# Patient Record
Sex: Female | Born: 1945 | Race: White | Hispanic: No | Marital: Married | Smoking: Never smoker
Health system: Southern US, Community
[De-identification: ages and names within clinical notes are randomized; demographics above are authoritative.]

## PROBLEM LIST (undated history)

## (undated) DIAGNOSIS — F32A Depression, unspecified: Secondary | ICD-10-CM

## (undated) DIAGNOSIS — M810 Age-related osteoporosis without current pathological fracture: Secondary | ICD-10-CM

## (undated) DIAGNOSIS — D509 Iron deficiency anemia, unspecified: Secondary | ICD-10-CM

## (undated) DIAGNOSIS — K219 Gastro-esophageal reflux disease without esophagitis: Secondary | ICD-10-CM

## (undated) DIAGNOSIS — I10 Essential (primary) hypertension: Secondary | ICD-10-CM

## (undated) DIAGNOSIS — G4700 Insomnia, unspecified: Secondary | ICD-10-CM

## (undated) DIAGNOSIS — Z9889 Other specified postprocedural states: Secondary | ICD-10-CM

## (undated) DIAGNOSIS — E785 Hyperlipidemia, unspecified: Secondary | ICD-10-CM

## (undated) DIAGNOSIS — Z961 Presence of intraocular lens: Secondary | ICD-10-CM

## (undated) DIAGNOSIS — I509 Heart failure, unspecified: Secondary | ICD-10-CM

## (undated) DIAGNOSIS — E063 Autoimmune thyroiditis: Secondary | ICD-10-CM

## (undated) DIAGNOSIS — G5601 Carpal tunnel syndrome, right upper limb: Secondary | ICD-10-CM

## (undated) DIAGNOSIS — I350 Nonrheumatic aortic (valve) stenosis: Secondary | ICD-10-CM

## (undated) DIAGNOSIS — M7580 Other shoulder lesions, unspecified shoulder: Secondary | ICD-10-CM

## (undated) DIAGNOSIS — M1990 Unspecified osteoarthritis, unspecified site: Secondary | ICD-10-CM

## (undated) DIAGNOSIS — R0609 Other forms of dyspnea: Secondary | ICD-10-CM

## (undated) DIAGNOSIS — N189 Chronic kidney disease, unspecified: Secondary | ICD-10-CM

## (undated) DIAGNOSIS — K3184 Gastroparesis: Secondary | ICD-10-CM

## (undated) DIAGNOSIS — R112 Nausea with vomiting, unspecified: Secondary | ICD-10-CM

## (undated) DIAGNOSIS — J189 Pneumonia, unspecified organism: Secondary | ICD-10-CM

## (undated) DIAGNOSIS — N183 Chronic kidney disease, stage 3 unspecified: Secondary | ICD-10-CM

## (undated) DIAGNOSIS — Z8719 Personal history of other diseases of the digestive system: Secondary | ICD-10-CM

## (undated) DIAGNOSIS — G5600 Carpal tunnel syndrome, unspecified upper limb: Secondary | ICD-10-CM

## (undated) DIAGNOSIS — M8580 Other specified disorders of bone density and structure, unspecified site: Secondary | ICD-10-CM

## (undated) DIAGNOSIS — G4730 Sleep apnea, unspecified: Secondary | ICD-10-CM

## (undated) DIAGNOSIS — T8859XA Other complications of anesthesia, initial encounter: Secondary | ICD-10-CM

## (undated) DIAGNOSIS — E139 Other specified diabetes mellitus without complications: Secondary | ICD-10-CM

## (undated) DIAGNOSIS — R011 Cardiac murmur, unspecified: Secondary | ICD-10-CM

## (undated) DIAGNOSIS — R0600 Dyspnea, unspecified: Secondary | ICD-10-CM

## (undated) DIAGNOSIS — E119 Type 2 diabetes mellitus without complications: Secondary | ICD-10-CM

## (undated) DIAGNOSIS — K862 Cyst of pancreas: Secondary | ICD-10-CM

## (undated) DIAGNOSIS — I5030 Unspecified diastolic (congestive) heart failure: Secondary | ICD-10-CM

## (undated) DIAGNOSIS — N281 Cyst of kidney, acquired: Secondary | ICD-10-CM

## (undated) DIAGNOSIS — E669 Obesity, unspecified: Secondary | ICD-10-CM

## (undated) DIAGNOSIS — G2581 Restless legs syndrome: Secondary | ICD-10-CM

## (undated) HISTORY — PX: COLONOSCOPY: SHX174

## (undated) HISTORY — PX: MASTECTOMY: SHX3

## (undated) HISTORY — PX: JOINT REPLACEMENT: SHX530

## (undated) HISTORY — PX: FRACTURE SURGERY: SHX138

---

## 1992-11-17 HISTORY — PX: ABDOMINAL HYSTERECTOMY: SHX81

## 1998-11-17 HISTORY — PX: BREAST SURGERY: SHX581

## 1999-01-16 DIAGNOSIS — C801 Malignant (primary) neoplasm, unspecified: Secondary | ICD-10-CM

## 1999-01-16 HISTORY — DX: Malignant (primary) neoplasm, unspecified: C80.1

## 2000-01-16 DIAGNOSIS — G2581 Restless legs syndrome: Secondary | ICD-10-CM | POA: Insufficient documentation

## 2002-11-17 HISTORY — PX: CARPAL TUNNEL RELEASE: SHX101

## 2002-11-17 HISTORY — PX: ANKLE FRACTURE SURGERY: SHX122

## 2008-11-17 DIAGNOSIS — E063 Autoimmune thyroiditis: Secondary | ICD-10-CM | POA: Insufficient documentation

## 2014-11-17 HISTORY — PX: SHOULDER ARTHROSCOPY: SHX128

## 2015-11-18 DIAGNOSIS — I639 Cerebral infarction, unspecified: Secondary | ICD-10-CM

## 2015-11-18 DIAGNOSIS — G459 Transient cerebral ischemic attack, unspecified: Secondary | ICD-10-CM

## 2015-11-18 HISTORY — DX: Transient cerebral ischemic attack, unspecified: G45.9

## 2015-11-18 HISTORY — DX: Cerebral infarction, unspecified: I63.9

## 2016-11-17 HISTORY — PX: BRACHIOPLASTY: SUR162

## 2018-11-17 HISTORY — PX: UPPER ESOPHAGEAL ENDOSCOPIC ULTRASOUND (EUS): SHX6562

## 2018-12-18 DIAGNOSIS — K824 Cholesterolosis of gallbladder: Secondary | ICD-10-CM | POA: Insufficient documentation

## 2018-12-18 DIAGNOSIS — K3184 Gastroparesis: Secondary | ICD-10-CM | POA: Insufficient documentation

## 2020-01-20 DIAGNOSIS — I10 Essential (primary) hypertension: Secondary | ICD-10-CM | POA: Insufficient documentation

## 2020-01-20 DIAGNOSIS — R0609 Other forms of dyspnea: Secondary | ICD-10-CM | POA: Insufficient documentation

## 2020-01-20 DIAGNOSIS — N1832 Chronic kidney disease, stage 3b: Secondary | ICD-10-CM | POA: Diagnosis present

## 2020-01-20 DIAGNOSIS — I5032 Chronic diastolic (congestive) heart failure: Secondary | ICD-10-CM | POA: Diagnosis present

## 2020-01-30 ENCOUNTER — Ambulatory Visit: Payer: Medicare Other

## 2020-01-30 ENCOUNTER — Encounter: Payer: Self-pay | Admitting: Emergency Medicine

## 2020-01-30 ENCOUNTER — Ambulatory Visit
Admission: EM | Admit: 2020-01-30 | Discharge: 2020-01-30 | Disposition: A | Discharge status: Home / Self Care | Payer: Medicare Other | Attending: Family Medicine | Admitting: Family Medicine

## 2020-01-30 ENCOUNTER — Other Ambulatory Visit: Payer: Self-pay

## 2020-01-30 DIAGNOSIS — W010XXA Fall on same level from slipping, tripping and stumbling without subsequent striking against object, initial encounter: Secondary | ICD-10-CM | POA: Diagnosis not present

## 2020-01-30 DIAGNOSIS — S63501A Unspecified sprain of right wrist, initial encounter: Secondary | ICD-10-CM | POA: Diagnosis not present

## 2020-01-30 HISTORY — DX: Essential (primary) hypertension: I10

## 2020-01-30 HISTORY — DX: Type 2 diabetes mellitus without complications: E11.9

## 2020-01-30 NOTE — ED Triage Notes (Signed)
Pt c/o right wrist pain. She states that she fell today about 3:30. She slipped on wet floor in her home. She has mild swelling.

## 2020-01-30 NOTE — Discharge Instructions (Signed)
Apply ice 20 minutes out of every 2 hours 4-5 times daily for comfort.  Weight your wrist frequently above the level of your heart sufficiently to control pain and swelling.  Continue to have pain in 2 to 3 weeks follow-up with orthopedics

## 2020-01-30 NOTE — ED Provider Notes (Signed)
[facility]    CSN: [id number] Arrival date & time: 01/30/20  1845      History   Chief Complaint Chief Complaint  Patient presents with  . Fall  . Wrist Pain    right    HPI Lindsay Glenn is a 74 y.o. female.   HPI  74 year old right-hand-dominant female was cleaning up spilled spaghetti sauce in her kitchen when she slipped on residual sauce falling forward onto her right outstretched hand.  Happened about 330 today.  That time she been complaining of right wrist pain over the carpal navicular area.  Some mild swelling in this area.  There is no ecchymosis or bruising at the present time.         Past Medical History:  Diagnosis Date  . Diabetes mellitus without complication ([place])   . Hypertension     There are no problems to display for this patient.   Past Surgical History:  Procedure Laterality Date  . ABDOMINAL HYSTERECTOMY    . ANKLE FRACTURE SURGERY Right   . BREAST SURGERY     bilateral mastectomy  . CARPAL TUNNEL RELEASE Right   . JOINT REPLACEMENT Bilateral    knee  . SHOULDER ARTHROSCOPY Right     OB History   No obstetric history on file.      Home Medications    Prior to Admission medications   Medication Sig Start Date End Date Taking? Authorizing Provider  Azilsartan Medoxomil (EDARBI) 40 MG TABS  10/06/19  Yes [provider]  carvedilol (COREG) 25 MG tablet  01/19/20  Yes [provider]  cloNIDine (CATAPRES) 0.1 MG tablet  07/01/19  Yes [provider]  empagliflozin (JARDIANCE) 10 MG TABS tablet Take by mouth. 01/20/20 01/19/21 Yes [provider]  furosemide (LASIX) 20 MG tablet daily   Yes [provider]  hydrALAZINE (APRESOLINE) 10 MG tablet hydralazine 10 mg tablet  TAKE ONE TABLET BY MOUTH PRN   Yes [provider]  insulin aspart (NOVOLOG) 100 UNIT/ML injection  01/18/20  Yes [provider]  omeprazole (PRILOSEC) 20 MG capsule  12/09/19  Yes [provider]  pravastatin (PRAVACHOL) 80 MG tablet  12/10/19  Yes [provider]  ropinirole (REQUIP) 5 MG tablet Take 5 mg by mouth at bedtime. 12/16/19  Yes [provider]  spironolactone (ALDACTONE) 25 MG tablet spironolactone 25 mg tablet   Yes [provider]    Family History History reviewed. No pertinent family history.  Social History Social History   Tobacco Use  . Smoking status: Never Smoker  . Smokeless tobacco: Never Used  Substance Use Topics  . Alcohol use: Not Currently  . Drug use: Not Currently     Allergies   Patient has no known allergies.   Review of Systems Review of Systems  Constitutional: Positive for activity change. Negative for appetite change, chills, fatigue and fever.  Musculoskeletal: Positive for arthralgias and joint swelling.  All other systems reviewed and are negative.    Physical Exam Triage Vital Signs ED Triage Vitals  Enc Vitals Group     BP 01/30/20 1904 (!) 179/66     Pulse Rate 01/30/20 1904 62     Resp 01/30/20 1904 18     Temp 01/30/20 1904 98.4 F (36.9 C)     Temp Source 01/30/20 1904 Oral     SpO2 01/30/20 1904 99 %     Weight 01/30/20 1857 196 lb (88.9 kg)  Height 01/30/20 1857 5' (1.524 m)     Head Circumference --      Peak Flow --      Pain Score 01/30/20 1857 6     Pain Loc --      Pain Edu? --      Excl. in [place]? --    No data found.  Updated Vital Signs BP (!) 179/66 (BP Location: Left Arm)   Pulse 62   Temp 98.4 F (36.9 C) (Oral)   Resp 18   Ht 5' (1.524 m)   Wt 196 lb (88.9 kg)   SpO2 99%   BMI 38.28 kg/m   Visual Acuity Right Eye Distance:   Left Eye Distance:   Bilateral Distance:    Right Eye Near:   Left Eye Near:    Bilateral Near:     Physical Exam Vitals and nursing note reviewed.  Constitutional:      General: She is not in acute distress.    Appearance: Normal appearance. She is not ill-appearing or toxic-appearing.  HENT:     Head:  Normocephalic and atraumatic.  Eyes:     Conjunctiva/sclera: Conjunctivae normal.  Musculoskeletal:        General: Swelling, tenderness and signs of injury present.     Cervical back: Normal range of motion and neck supple.     Comments: Examination of the right dominant wrist shows mild swelling over the snuffbox area.  This area is the most tender.  She has extension to 45 degrees flexion to 55 degrees pronation to 90 degrees supination to 20 degrees.  She is most tender over the snuffbox area.  There is no tenderness over the phalanges or metacarpals.  Skin:    General: Skin is warm and dry.  Neurological:     General: No focal deficit present.     Mental Status: She is alert and oriented to person, place, and time.  Psychiatric:        Mood and Affect: Mood normal.        Behavior: Behavior normal.        Thought Content: Thought content normal.        Judgment: Judgment normal.      UC Treatments / Results  Labs (all labs ordered are listed, but only abnormal results are displayed) Labs Reviewed - No data to display  EKG   Radiology DG Wrist Complete Right  Result Date: 01/30/2020 CLINICAL DATA:  Fall with wrist pain EXAM: RIGHT WRIST - COMPLETE 3+ VIEW COMPARISON:  None. FINDINGS: No fracture or malalignment. No radiopaque foreign body in the soft tissues. Moderate degenerative changes at the first [facility] joint. IMPRESSION: No acute osseous abnormality. Electronically Signed   By: Donavan Foil M.D.   On: 01/30/2020 20:06    Procedures Procedures (including critical care time)  Medications Ordered in UC Medications - No data to display  Initial Impression / Assessment and Plan / UC Course  I have reviewed the triage vital signs and the nursing notes.  Pertinent labs & imaging results that were available during my care of the patient were reviewed by me and considered in my medical decision making (see chart for details).    74 year old female presents today after a  fall with injury to her right dominant wrist.  Examination showed minimal amount of swelling and most of pain over the carpal navicular area.  I independently reviewed the x-rays which did not show fracture or dislocation.  This was confirmed later by  radiology.  He was instructed to place ice on the area 20 minutes every 2 hours 4-5 times daily as necessary for pain or swelling.  Elevate above the level heart sufficiently to control swelling and pain.  Her a Velcro wrist splint for support she may wean as tolerated.  She was told if she continues to have pain she should follow-up with the orthopedic surgery after 2 to 3 weeks.  Final Clinical Impressions(s) / UC Diagnoses   Final diagnoses:  Sprain of right wrist, initial encounter     Discharge Instructions     Apply ice 20 minutes out of every 2 hours 4-5 times daily for comfort.  Weight your wrist frequently above the level of your heart sufficiently to control pain and swelling.  Continue to have pain in 2 to 3 weeks follow-up with orthopedics    ED Prescriptions    None     PDMP not reviewed this encounter.   Lorin Picket, PA-C 01/30/20 2022

## 2020-03-20 DIAGNOSIS — E785 Hyperlipidemia, unspecified: Secondary | ICD-10-CM | POA: Insufficient documentation

## 2020-03-21 ENCOUNTER — Other Ambulatory Visit: Payer: Self-pay | Admitting: Surgery

## 2020-03-28 ENCOUNTER — Encounter
Admission: RE | Admit: 2020-03-28 | Discharge: 2020-03-28 | Disposition: A | Discharge status: Home / Self Care | Payer: Medicare Other | Source: Ambulatory Visit | Attending: Surgery | Admitting: Surgery

## 2020-03-28 ENCOUNTER — Other Ambulatory Visit: Payer: Self-pay

## 2020-03-28 ENCOUNTER — Other Ambulatory Visit
Admission: RE | Admit: 2020-03-28 | Discharge: 2020-03-28 | Disposition: A | Discharge status: Home / Self Care | Payer: Medicare Other | Source: Ambulatory Visit | Attending: Surgery | Admitting: Surgery

## 2020-03-28 ENCOUNTER — Inpatient Hospital Stay: Admission: RE | Admit: 2020-03-28 | Payer: Medicare Other | Source: Ambulatory Visit

## 2020-03-28 DIAGNOSIS — Z01818 Encounter for other preprocedural examination: Secondary | ICD-10-CM | POA: Insufficient documentation

## 2020-03-28 DIAGNOSIS — I10 Essential (primary) hypertension: Secondary | ICD-10-CM

## 2020-03-28 HISTORY — DX: Carpal tunnel syndrome, right upper limb: G56.01

## 2020-03-28 HISTORY — DX: Dyspnea, unspecified: R06.00

## 2020-03-28 HISTORY — DX: Obesity, unspecified: E66.9

## 2020-03-28 HISTORY — DX: Cyst of pancreas: K86.2

## 2020-03-28 HISTORY — DX: Other specified disorders of bone density and structure, unspecified site: M85.80

## 2020-03-28 HISTORY — DX: Insomnia, unspecified: G47.00

## 2020-03-28 HISTORY — DX: Unspecified diastolic (congestive) heart failure: I50.30

## 2020-03-28 HISTORY — DX: Sleep apnea, unspecified: G47.30

## 2020-03-28 HISTORY — DX: Hyperlipidemia, unspecified: E78.5

## 2020-03-28 HISTORY — DX: Gastro-esophageal reflux disease without esophagitis: K21.9

## 2020-03-28 HISTORY — DX: Chronic kidney disease, unspecified: N18.9

## 2020-03-28 HISTORY — DX: Autoimmune thyroiditis: E06.3

## 2020-03-28 HISTORY — DX: Personal history of other diseases of the digestive system: Z87.19

## 2020-03-28 HISTORY — DX: Restless legs syndrome: G25.81

## 2020-03-28 HISTORY — DX: Gastroparesis: K31.84

## 2020-03-28 NOTE — Patient Instructions (Addendum)
COVID TESTING Date: Monday, May 17 Testing site:  [facility] [address] Thru Hours:  [id number] am - 1:00 pm Once you are tested, you are asked to stay quarantined (avoiding public places) until after your surgery.   Your procedure is scheduled on: Wednesday, May 19 Report to Day Surgery on the 2nd floor of the [facility]. To find out your arrival time, please call [phone] between 1PM - 3PM on: Tuesday, May 18  REMEMBER: Instructions that are not followed completely may result in serious medical risk, up to and including death; or upon the discretion of your surgeon and anesthesiologist your surgery may need to be rescheduled.  Do not eat food after midnight the night before surgery.  No gum chewing, lozengers or hard candies.  You may however, drink water up to 2 hours before you are scheduled to arrive for your surgery. Do not drink anything within 2 hours of your scheduled arrival time.  ENSURE PRE-SURGERY CARBOHYDRATE DRINK:  Complete drinking 2 hours prior to scheduled arrival time.  TAKE THESE MEDICATIONS THE MORNING OF SURGERY WITH A SIP OF WATER:  1.  Carvedilol 2.  Omeprazole - (take one the night before and one on the morning of surgery - helps to prevent nausea after surgery.)  Keep the insulin pump on; the anesthesiologist will instruct you on any changes the day of surgery.  Stop aspirin and Anti-inflammatories (NSAIDS) such as Advil, Aleve, Ibuprofen, Motrin, Naproxen, Naprosyn and Aspirin based products such as Excedrin, Goodys Powder, BC Powder. (May take Tylenol or Acetaminophen if needed.)  Stop ANY OVER THE COUNTER supplements until after surgery. (May continue Vitamin D, Vitamin B, and multivitamin.)  No Alcohol for 24 hours before or after surgery.  On the morning of surgery brush your teeth with toothpaste and water, you may rinse your mouth with mouthwash if you wish. Do not swallow any toothpaste or mouthwash.  Do not  wear jewelry, make-up, hairpins, clips or nail polish.  Do not wear lotions, powders, or perfumes.   Do not shave 48 hours prior to surgery.   Contact lenses, hearing aids and dentures may not be worn into surgery.  Do not bring valuables to the hospital. [facility] is not responsible for any missing/lost belongings or valuables.   Use CHG Soap as directed on instruction sheet.  Notify your doctor if there is any change in your medical condition (cold, fever, infection).  Wear comfortable clothing (specific to your surgery type) to the hospital.  Plan for stool softeners for home use; pain medications have a tendency to cause constipation. You can also help prevent constipation by eating foods high in fiber such as fruits and vegetables and drinking plenty of fluids as your diet allows.  After surgery, you can help prevent lung complications by doing breathing exercises.  Take deep breaths and cough every 1-2 hours. Your doctor may order a device called an Incentive Spirometer to help you take deep breaths.  If you are being discharged the day of surgery, you will not be allowed to drive home. You will need a responsible adult (18 years or older) to drive you home and stay with you that night.   If you are taking public transportation, you will need to have a responsible adult (18 years or older) with you. Please confirm with your physician that it is acceptable to use public transportation.   Please call the [place] [facility] at [phone] if you have any questions  about these instructions.  Visitation Policy:  Patients undergoing a surgery or procedure may have one family member or support person with them as long as that person is not COVID-19 positive or experiencing its symptoms.  That person may remain in the waiting area during the procedure.

## 2020-04-02 ENCOUNTER — Other Ambulatory Visit: Payer: Self-pay

## 2020-04-02 ENCOUNTER — Other Ambulatory Visit
Admission: RE | Admit: 2020-04-02 | Discharge: 2020-04-02 | Disposition: A | Discharge status: Home / Self Care | Payer: Medicare Other | Source: Ambulatory Visit | Attending: Surgery | Admitting: Surgery

## 2020-04-02 DIAGNOSIS — Z01812 Encounter for preprocedural laboratory examination: Secondary | ICD-10-CM | POA: Diagnosis present

## 2020-04-02 DIAGNOSIS — Z20822 Contact with and (suspected) exposure to covid-19: Secondary | ICD-10-CM | POA: Insufficient documentation

## 2020-04-03 LAB — SARS CORONAVIRUS 2 (TAT 6-24 HRS): SARS Coronavirus 2: NEGATIVE

## 2020-04-04 ENCOUNTER — Encounter: Payer: Self-pay | Admitting: Surgery

## 2020-04-04 ENCOUNTER — Other Ambulatory Visit: Payer: Self-pay

## 2020-04-04 ENCOUNTER — Ambulatory Visit: Payer: Medicare Other | Admitting: Certified Registered"

## 2020-04-04 ENCOUNTER — Encounter
Admission: RE | Disposition: A | Discharge status: Home / Self Care | Payer: Self-pay | Source: Home / Self Care | Attending: Surgery

## 2020-04-04 ENCOUNTER — Ambulatory Visit
Admission: RE | Admit: 2020-04-04 | Discharge: 2020-04-04 | Disposition: A | Discharge status: Home / Self Care | Payer: Medicare Other | Attending: Surgery | Admitting: Surgery

## 2020-04-04 DIAGNOSIS — E1122 Type 2 diabetes mellitus with diabetic chronic kidney disease: Secondary | ICD-10-CM | POA: Diagnosis not present

## 2020-04-04 DIAGNOSIS — I130 Hypertensive heart and chronic kidney disease with heart failure and stage 1 through stage 4 chronic kidney disease, or unspecified chronic kidney disease: Secondary | ICD-10-CM | POA: Insufficient documentation

## 2020-04-04 DIAGNOSIS — K219 Gastro-esophageal reflux disease without esophagitis: Secondary | ICD-10-CM | POA: Insufficient documentation

## 2020-04-04 DIAGNOSIS — Z8673 Personal history of transient ischemic attack (TIA), and cerebral infarction without residual deficits: Secondary | ICD-10-CM | POA: Diagnosis not present

## 2020-04-04 DIAGNOSIS — Z9641 Presence of insulin pump (external) (internal): Secondary | ICD-10-CM | POA: Insufficient documentation

## 2020-04-04 DIAGNOSIS — Z96653 Presence of artificial knee joint, bilateral: Secondary | ICD-10-CM | POA: Insufficient documentation

## 2020-04-04 DIAGNOSIS — G5601 Carpal tunnel syndrome, right upper limb: Secondary | ICD-10-CM | POA: Diagnosis present

## 2020-04-04 DIAGNOSIS — G4730 Sleep apnea, unspecified: Secondary | ICD-10-CM | POA: Insufficient documentation

## 2020-04-04 DIAGNOSIS — Z79899 Other long term (current) drug therapy: Secondary | ICD-10-CM | POA: Insufficient documentation

## 2020-04-04 DIAGNOSIS — I5030 Unspecified diastolic (congestive) heart failure: Secondary | ICD-10-CM | POA: Diagnosis not present

## 2020-04-04 DIAGNOSIS — Z794 Long term (current) use of insulin: Secondary | ICD-10-CM | POA: Insufficient documentation

## 2020-04-04 DIAGNOSIS — Z7982 Long term (current) use of aspirin: Secondary | ICD-10-CM | POA: Diagnosis not present

## 2020-04-04 DIAGNOSIS — N183 Chronic kidney disease, stage 3 unspecified: Secondary | ICD-10-CM | POA: Diagnosis not present

## 2020-04-04 HISTORY — PX: CARPAL TUNNEL RELEASE: SHX101

## 2020-04-04 LAB — GLUCOSE, CAPILLARY
Glucose-Capillary: 168 mg/dL — ABNORMAL HIGH (ref 70–99)
Glucose-Capillary: 163 mg/dL — ABNORMAL HIGH (ref 70–99)

## 2020-04-04 SURGERY — CARPAL TUNNEL RELEASE
Anesthesia: General | Site: Wrist | Laterality: Right

## 2020-04-04 MED ORDER — ONDANSETRON HCL 4 MG/2ML IJ SOLN: 4.0000 mg | Freq: Once | INTRAMUSCULAR | Status: DC | PRN

## 2020-04-04 MED ORDER — ROCURONIUM BROMIDE 100 MG/10ML IV SOLN
INTRAVENOUS | Status: DC | PRN
Start: 2020-04-04 — End: 2020-04-04
  Administered 2020-04-04: 20 mg via INTRAVENOUS
  Administered 2020-04-04: 10 mg via INTRAVENOUS

## 2020-04-04 MED ORDER — FENTANYL CITRATE (PF) 100 MCG/2ML IJ SOLN: 25.0000 ug | INTRAMUSCULAR | Status: DC | PRN

## 2020-04-04 MED ORDER — PHENYLEPHRINE HCL (PRESSORS) 10 MG/ML IV SOLN
INTRAVENOUS | Status: DC | PRN
  Administered 2020-04-04: 200 ug via INTRAVENOUS
  Administered 2020-04-04 (×2): 100 ug via INTRAVENOUS

## 2020-04-04 MED ORDER — DEXAMETHASONE SODIUM PHOSPHATE 10 MG/ML IJ SOLN
INTRAMUSCULAR | Status: DC | PRN
  Administered 2020-04-04: 5 mg via INTRAVENOUS

## 2020-04-04 MED ORDER — SODIUM CHLORIDE 0.9 % IV SOLN
INTRAVENOUS | Status: DC
  Administered 2020-04-04: 50 mL/h via INTRAVENOUS

## 2020-04-04 MED ORDER — HYDROCODONE-ACETAMINOPHEN 5-325 MG PO TABS: 1.0000 | ORAL_TABLET | Freq: Four times a day (QID) | ORAL | 0 refills | Status: DC | PRN

## 2020-04-04 MED ORDER — GLYCOPYRROLATE 0.2 MG/ML IJ SOLN
INTRAMUSCULAR | Status: DC | PRN
  Administered 2020-04-04: .2 mg via INTRAVENOUS

## 2020-04-04 MED ORDER — KETOROLAC TROMETHAMINE 30 MG/ML IJ SOLN
INTRAMUSCULAR | Status: DC | PRN
  Administered 2020-04-04 (×2): 15 mg via INTRAVENOUS

## 2020-04-04 MED ORDER — LIDOCAINE HCL (CARDIAC) PF 100 MG/5ML IV SOSY
PREFILLED_SYRINGE | INTRAVENOUS | Status: DC | PRN
  Administered 2020-04-04: 100 mg via INTRAVENOUS

## 2020-04-04 MED ORDER — CEFAZOLIN SODIUM-DEXTROSE 2-4 GM/100ML-% IV SOLN
2.0000 g | INTRAVENOUS | Status: AC
  Administered 2020-04-04: 2 g via INTRAVENOUS

## 2020-04-04 MED ORDER — SUGAMMADEX SODIUM 500 MG/5ML IV SOLN
INTRAVENOUS | Status: DC | PRN
  Administered 2020-04-04: 550 mg via INTRAVENOUS

## 2020-04-04 MED ORDER — ACETAMINOPHEN 10 MG/ML IV SOLN
INTRAVENOUS | Status: DC | PRN
  Administered 2020-04-04: 1000 mg via INTRAVENOUS

## 2020-04-04 MED ORDER — EPHEDRINE SULFATE 50 MG/ML IJ SOLN
INTRAMUSCULAR | Status: DC | PRN
  Administered 2020-04-04: 5 mg via INTRAVENOUS

## 2020-04-04 MED ORDER — ACETAMINOPHEN NICU IV SYRINGE 10 MG/ML
INTRAVENOUS | Status: AC
  Filled 2020-04-04: qty 1

## 2020-04-04 MED ORDER — HYDROCODONE-ACETAMINOPHEN 5-325 MG PO TABS
ORAL_TABLET | ORAL | Status: AC
  Administered 2020-04-04: 1
  Filled 2020-04-04: qty 1

## 2020-04-04 MED ORDER — BUPIVACAINE HCL (PF) 0.5 % IJ SOLN
INTRAMUSCULAR | Status: AC
  Filled 2020-04-04: qty 30

## 2020-04-04 MED ORDER — PROPOFOL 10 MG/ML IV BOLUS
INTRAVENOUS | Status: DC | PRN
  Administered 2020-04-04: 130 mg via INTRAVENOUS

## 2020-04-04 MED ORDER — CEFAZOLIN SODIUM-DEXTROSE 2-4 GM/100ML-% IV SOLN
INTRAVENOUS | Status: AC
  Filled 2020-04-04: qty 100

## 2020-04-04 MED ORDER — LIDOCAINE HCL (PF) 1 % IJ SOLN
INTRAMUSCULAR | Status: AC
  Filled 2020-04-04: qty 30

## 2020-04-04 MED ORDER — FENTANYL CITRATE (PF) 100 MCG/2ML IJ SOLN
INTRAMUSCULAR | Status: AC
  Filled 2020-04-04: qty 2

## 2020-04-04 MED ORDER — BUPIVACAINE HCL (PF) 0.5 % IJ SOLN
INTRAMUSCULAR | Status: DC | PRN
  Administered 2020-04-04: 10 mL

## 2020-04-04 MED ORDER — HYDROCODONE-ACETAMINOPHEN 5-325 MG PO TABS: 1.0000 | ORAL_TABLET | Freq: Once | ORAL | Status: DC

## 2020-04-04 MED ORDER — SUCCINYLCHOLINE CHLORIDE 20 MG/ML IJ SOLN
INTRAMUSCULAR | Status: DC | PRN
  Administered 2020-04-04: 120 mg via INTRAVENOUS

## 2020-04-04 MED ORDER — ONDANSETRON HCL 4 MG/2ML IJ SOLN
INTRAMUSCULAR | Status: DC | PRN
  Administered 2020-04-04: 4 mg via INTRAVENOUS

## 2020-04-04 SURGICAL SUPPLY — 34 items
BNDG COHESIVE 4X5 TAN STRL (GAUZE/BANDAGES/DRESSINGS) ×2 IMPLANT
BNDG ESMARK 4X12 TAN STRL LF (GAUZE/BANDAGES/DRESSINGS) ×3 IMPLANT
CANISTER SUCT 1200ML W/VALVE (MISCELLANEOUS) ×3 IMPLANT
CHLORAPREP W/TINT 26 (MISCELLANEOUS) ×3 IMPLANT
CORD BIP STRL DISP 12FT (MISCELLANEOUS) ×3 IMPLANT
COVER WAND RF STERILE (DRAPES) ×3 IMPLANT
CUFF TOURN 18 STER (MISCELLANEOUS) IMPLANT
CUFF TOURN SGL QUICK 24 (TOURNIQUET CUFF) ×2
CUFF TRNQT CYL 24X4X16.5-23 (TOURNIQUET CUFF) IMPLANT
DRAPE SURG 17X11 SM STRL (DRAPES) ×6 IMPLANT
FORCEPS JEWEL BIP 4-3/4 STR (INSTRUMENTS) ×3 IMPLANT
GAUZE SPONGE 4X4 12PLY STRL (GAUZE/BANDAGES/DRESSINGS) ×3 IMPLANT
GAUZE XEROFORM 1X8 LF (GAUZE/BANDAGES/DRESSINGS) ×3 IMPLANT
GLOVE BIO SURGEON STRL SZ8 (GLOVE) ×6 IMPLANT
GLOVE INDICATOR 8.0 STRL GRN (GLOVE) ×3 IMPLANT
GOWN STRL REUS W/ TWL LRG LVL3 (GOWN DISPOSABLE) ×1 IMPLANT
GOWN STRL REUS W/ TWL XL LVL3 (GOWN DISPOSABLE) ×1 IMPLANT
GOWN STRL REUS W/TWL LRG LVL3 (GOWN DISPOSABLE) ×2
GOWN STRL REUS W/TWL XL LVL3 (GOWN DISPOSABLE) ×2
KIT TURNOVER KIT A (KITS) ×3 IMPLANT
NDL HYPO 25X1 1.5 SAFETY (NEEDLE) ×1 IMPLANT
NEEDLE HYPO 25X1 1.5 SAFETY (NEEDLE) ×3 IMPLANT
NS IRRIG 500ML POUR BTL (IV SOLUTION) ×3 IMPLANT
PACK EXTREMITY (MISCELLANEOUS) ×3 IMPLANT
PAD CAST CTTN 4X4 STRL (SOFTGOODS) ×1 IMPLANT
PADDING CAST COTTON 4X4 STRL (SOFTGOODS) ×2
SPLINT WRIST LG LT TX990309 (SOFTGOODS) ×1 IMPLANT
SPLINT WRIST LG RT TX900304 (SOFTGOODS) ×2 IMPLANT
SPLINT WRIST M LT TX990308 (SOFTGOODS) ×1 IMPLANT
SPLINT WRIST M RT TX990303 (SOFTGOODS) ×1 IMPLANT
STOCKINETTE 48X4 2 PLY STRL (GAUZE/BANDAGES/DRESSINGS) ×1 IMPLANT
STOCKINETTE IMPERVIOUS 9X36 MD (GAUZE/BANDAGES/DRESSINGS) ×2 IMPLANT
STOCKINETTE STRL 4IN 9604848 (GAUZE/BANDAGES/DRESSINGS) ×3 IMPLANT
SUT PROLENE 4 0 PS 2 18 (SUTURE) ×5 IMPLANT

## 2020-04-04 NOTE — Transfer of Care (Signed)
Immediate Anesthesia Transfer of Care Note  Patient: Lindsay Glenn  Procedure(s) Performed: OPEN CARPAL TUNNEL RELEASE (Right Wrist)  Patient Location: PACU  Anesthesia Type:General  Level of Consciousness: awake, drowsy, patient cooperative and responds to stimulation  Airway & Oxygen Therapy: Patient Spontanous Breathing and Patient connected to face mask oxygen  Post-op Assessment: Report given to RN and Post -op Vital signs reviewed and stable  Post vital signs: Reviewed and stable  Last Vitals:  Vitals Value Taken Time  BP 147/67 04/04/20 1054  Temp 35.6 C 04/04/20 1054  Pulse 63 04/04/20 1057  Resp 23 04/04/20 1057  SpO2 100 % 04/04/20 1057  Vitals shown include unvalidated device data.  Last Pain:  Vitals:   04/04/20 1054  TempSrc:   PainSc: 0-No pain         Complications: No apparent anesthesia complications

## 2020-04-04 NOTE — Anesthesia Postprocedure Evaluation (Signed)
Anesthesia Post Note  Patient: Lindsay Glenn  Procedure(s) Performed: OPEN CARPAL TUNNEL RELEASE (Right Wrist)  Patient location during evaluation: PACU Anesthesia Type: General Level of consciousness: awake and alert and oriented Pain management: pain level controlled Vital Signs Assessment: post-procedure vital signs reviewed and stable Respiratory status: spontaneous breathing, nonlabored ventilation and respiratory function stable Cardiovascular status: blood pressure returned to baseline and stable Postop Assessment: no signs of nausea or vomiting Anesthetic complications: no     Last Vitals:  Vitals:   04/04/20 1125 04/04/20 1131  BP: 139/76 133/63  Pulse: (!) 59 63  Resp: 17 20  Temp:  (!) 36.1 C  SpO2: 96% 95%    Last Pain:  Vitals:   04/04/20 1131  TempSrc: Temporal  PainSc: 0-No pain                 Delores Edelstein

## 2020-04-04 NOTE — Anesthesia Preprocedure Evaluation (Signed)
Anesthesia Evaluation  Patient identified by MRN, date of birth, ID band Patient awake    Reviewed: Allergy & Precautions, NPO status , Patient's Chart, lab work & pertinent test results  History of Anesthesia Complications Negative for: history of anesthetic complications  Airway Mallampati: III  TM Distance: >3 FB Neck ROM: Full    Dental no notable dental hx.    Pulmonary asthma (mild intermittent) , sleep apnea (does not tolerate CPAP) , neg COPD,    breath sounds clear to auscultation- rhonchi (-) wheezing      Cardiovascular hypertension, Pt. 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 No date: Carpal tunnel syndrome of right wrist No date: Chronic kidney disease     Comment:  stage 3 No date: Diabetes mellitus without complication (HCC)     Comment:  type 2; on insulin pump No date: Diastolic heart failure (HCC) No date: Dyspnea No date: Gastroparesis No date: GERD (gastroesophageal reflux disease) No date: Hashimoto's thyroiditis No date: History of hiatal hernia No date: Hypertension No date: Insomnia No date: Lipidemia No date: Obesity No date: Osteopenia No date: Pancreatic cyst No date: Restless leg syndrome No date: Sleep apnea     Comment:  unable to tolerate c-pap 2017: TIA (transient ischemic attack)   Reproductive/Obstetrics                             Anesthesia Physical Anesthesia  Plan  ASA: III  Anesthesia Plan: General   Post-op Pain Management:    Induction: Intravenous  PONV Risk Score and Plan: 2 and Ondansetron and Dexamethasone  Airway Management Planned: Oral ETT  Additional Equipment:   Intra-op Plan:   Post-operative Plan: Extubation in OR  Informed Consent: I have reviewed the patients History and Physical, chart, labs and discussed the procedure including the risks, benefits and alternatives for the proposed anesthesia with the patient or authorized representative who has indicated his/her understanding and acceptance.     Dental advisory given  Plan Discussed with: CRNA and Anesthesiologist  Anesthesia Plan Comments:         Anesthesia Quick Evaluation

## 2020-04-04 NOTE — Op Note (Signed)
04/04/2020  10:46 AM  Patient:   Lindsay Glenn  Pre-Op Diagnosis:   Recurrent right carpal tunnel syndrome.  Post-Op Diagnosis:   Same.  Procedure:   Open right carpal tunnel release.  Surgeon:   Pascal Lux, MD  Anesthesia:   GET  Findings:   As above.  Complications:   None  EBL:   5 cc  Fluids:   500 cc crystalloid  TT:   33 minutes at 250 mmHg  Drains:   None  Closure:   4-0 Prolene interrupted sutures  Brief Clinical Note:   The patient is a 74 year old female with an 8-9 month history of recurrent pain and paresthesias to her right hand. She had undergone a mini open right carpal tunnel release 10 to 12 years ago from which she did quite well until the recent flare-up of her symptoms. Her symptoms have progressed despite medications, activity modification, splinting, etc. An EMG/NCV has confirmed the presence of recurrent carpal tunnel syndrome. The patient presents at this time for an open right carpal tunnel release.   Procedure:   The patient was brought into the operating room and lain in the supine position. After adequate general endotracheal intubation and anesthesia was achieved, the right hand and upper extremity were prepped with ChloraPrep solution before being draped sterilely. Preoperative antibiotics were administered. A timeout was performed to verify the appropriate surgical site before the limb was exsanguinated with an Esmarch and the tourniquet inflated to 250 mmHg.   Utilizing the previous incision on the volar aspect of the palm, the incision was extended proximally in a zigzag fashion over the wrist flexor crease into the distal forearm. The incision was carried down through the subcutaneous tissues with care taken to identify and protect any neurovascular structures. The distal forearm fascia was penetrated just proximal to the transverse carpal ligament. The soft tissues were released off the superficial and deep surfaces of the distal forearm fascia  and this was released proximally for 3-4 cm under direct visualization. The nerve was visualized directly before a Soil scientist was passed beneath the transverse carpal ligament along the ulnar aspect of the carpal tunnel and used to release any adhesions as well as to remove any adherent synovial tissue. It also was used to protect the underlying nerve as the recurrent scar tissue and residual transverse carpal ligament was transected in line with the incision. Several adhesions along the ulnar side of the nerve were released to allow the nerve to move more freely within the carpal tunnel. Hemostasis was achieved using bipolar electrocautery.  The wound was irrigated thoroughly with sterile saline solution before being closed using 4-0 Prolene interrupted sutures. A total of 10 cc of 0.5% plain Sensorcaine was injected in and around the incision before a sterile bulky dressing was applied to the wound. The patient was placed into a volar wrist splint before being awakened, extubated, and returned to the recovery room in satisfactory condition after tolerating the procedure well.

## 2020-04-04 NOTE — H&P (Signed)
History of Present Illness:  Lindsay Glenn is a 74 y.o. female who presents for evaluation and treatment of a 6-8 month history of recurrent pain and paresthesias to her right hand. The patient had undergone a mini open right carpal tunnel release in [place] 10 to 12 years ago, from which she did quite well. Over the past 6-8 months or so, she has begun to notice increased right hand pain and weakness with the recurrence of paresthesias to the thumb, index, and long fingers. The symptoms are not bothering her too much at night, but she notes that her symptoms tend to progressively worsen during the course the day, especially when she is performing repetitive activities, such as knitting, gardening, or performing other activities which are important to her. She had been living in [place] and received her treatment down there for the symptoms. She had undergone an EMG which apparently confirmed the presence of recurrent carpal tunnel syndrome. She received a steroid injection into her right carpal tunnel region in January, prior to moving up here in February. She notes the injection was quite beneficial in helping to control her symptoms, but the symptoms are beginning to recur, prompting her to seek orthopedic care. She saw Reche Dixon, PA-C, who diagnosed her with recurrent carpal tunnel syndrome and referred her to me for further evaluation and treatment.  Current Outpatient Medications: . aspirin (ASPIRIN LOW DOSE) 81 MG EC tablet  . azilsartan medoxomiL (EDARBI) 40 mg Tab  . carvediloL (COREG) 25 MG tablet  . cephalexin (KEFLEX) 500 MG capsule  . ciprofloxacin HCl (CIPRO) 500 MG tablet  . cloNIDine HCL (CATAPRES) 0.1 MG tablet  . clotrimazole-betamethasone (LOTRISONE) 1-0.05 % cream clotrimazole-betamethasone 1 %-0.05 % topical cream  . EDARBI 40 mg Tab  . empagliflozin (JARDIANCE) 10 mg tablet Take 1 tablet (10 mg total) by mouth daily with breakfast 90 tablet 3  . FREESTYLE LIBRE 14 DAY SENSOR kit USE  TWICE A MONTH  . FUROsemide (LASIX) 20 MG tablet daily  . hydrALAZINE (APRESOLINE) 10 MG tablet hydralazine 10 mg tablet TAKE ONE TABLET BY MOUTH PRN  . losartan (COZAAR) 50 MG tablet losartan 50 mg tablet  . metoclopramide (REGLAN) 10 MG tablet metoclopramide 10 mg tablet  . NOVOLOG U-100 INSULIN ASPART injection (concentration 100 units/mL)  . omeprazole (PRILOSEC) 20 MG DR capsule  . OMNIPOD DASH 5 PACK POD Crtg  . oxyCODONE-acetaminophen (PERCOCET) 10-325 mg tablet oxycodone-acetaminophen 10 mg-325 mg tablet  . pravastatin (PRAVACHOL) 80 MG tablet  . rOPINIRole (REQUIP) 5 MG tablet  . spironolactone (ALDACTONE) 25 MG tablet spironolactone 25 mg tablet   No current Epic-ordered facility-administered medications on file.   Allergies  Allergen Reactions  . Dulaglutide Diarrhea  . Atorvastatin Other (See Comments)  . Lisinopril Cough  . Rosuvastatin Unknown   Past Medical History:  Diagnosis Date  . Hypotension  . Sleep apnea  . Type 2 diabetes mellitus (CMS-HCC)   Past Surgical History:  Procedure Laterality Date  . COMBINED AUGMENTATION MAMMAPLASTY AND ABDOMINOPLASTY  . HYSTERECTOMY VAGINAL W/COLPECTOMY 1995  . REPLACEMENT TOTAL KNEE Left 2006  . REPLACEMENT TOTAL KNEE Right 2019  . SHOULDER ARTHROSCOPY DISTAL CLAVICLE EXCISION AND OPEN ROTATOR CUFF REPAIR Right 2017   Family History  Problem Relation Age of Onset  . Macular degeneration Neg Hx  . Glaucoma Neg Hx   Social History   Socioeconomic History  . Marital status: Married  Spouse name: Not on file  . Number of children: Not on file  . Years  of education: Not on file  . Highest education level: Not on file  Occupational History  . Not on file  Tobacco Use  . Smoking status: Never Smoker  . Smokeless tobacco: Never Used  Substance and Sexual Activity  . Alcohol use: Yes  Alcohol/week: 2.0 standard drinks  Types: 1 Glasses of wine, 1 Shots of liquor per week  . Drug use: Not on file  . Sexual  activity: Not on file  Other Topics Concern  . Not on file  Social History Narrative  . Not on file   Social Determinants of Health   Financial Resource Strain:  . Difficulty of Paying Living Expenses:  Food Insecurity:  . Worried About Charity fundraiser in the Last Year:  . Arboriculturist in the Last Year:  Transportation Needs:  . Film/video editor (Medical):  Marland Kitchen Lack of Transportation (Non-Medical):   Review of Systems:  A comprehensive 14 point ROS was performed, reviewed, and the pertinent orthopaedic findings are documented in the HPI.  Physical Exam: Vitals:  03/19/20 0917  BP: 130/82  Weight: 90.2 kg (198 lb 12.8 oz)  Height: 152.4 cm (5')  PainSc: 0-No pain  PainLoc: Wrist   General/Constitutional: Pleasant overweight elderly female in no acute distress. Neuro/Psych: Normal mood and affect, oriented to person, place and time. Eyes: Non-icteric. Pupils are equal, round, and reactive to light, and exhibit synchronous movement. ENT: Unremarkable. Lymphatic: No palpable adenopathy. Respiratory: Lungs clear to auscultation, Normal chest excursion, No wheezes and Non-labored breathing Cardiovascular: Regular rate and rhythm. No murmurs. and No edema, swelling or tenderness, except as noted in detailed exam. Integumentary: No impressive skin lesions present, except as noted in detailed exam. Musculoskeletal: Unremarkable, except as noted in detailed exam.  Right wrist exam: Skin inspection of the right wrist is notable for a well-healed surgical incision over the volar wrist region, but otherwise is unremarkable. No swelling, erythema, ecchymosis, abrasions, or other skin abnormalities are identified. She exhibits full active and passive range of motion of the wrist without pain. She also is able active flex and extend all digits fully without any pain or triggering. She is able to oppose her thumb to the base of the little finger. She is neurovascularly intact to all  digits. She does have a positive Phalen's test, as well as a positive Tinel's test over the carpal tunnel.  EMG results:  The results of her recent EMG performed in [place] are unavailable for review at this time. The report has been sent for, however.  Assessment: . Carpal tunnel syndrome, right  . History of carpal tunnel release   Plan: The treatment options were discussed with the patient. In addition, patient educational materials were provided regarding the diagnosis and treatment options. The patient is frustrated by her progressive pain and functional limitations resulting from recurrent carpal tunnel syndrome, and is ready to consider more aggressive treatment options. Therefore, I have recommended a surgical procedure, specifically an open carpal tunnel release. The procedure was discussed with the patient, as were the potential risks (including bleeding, infection, nerve and/or blood vessel injury, persistent or recurrent pain/paresthesias, weakness of grip, need for further surgery, blood clots, strokes, heart attacks and/or arhythmias, pneumonia, etc.) and benefits. The patient states her understanding and wishes to proceed. All of the patient's questions and concerns were answered. She can call any time with further concerns. She will follow up post-surgery, routine.   H&P reviewed and patient re-examined. No changes.

## 2020-04-04 NOTE — Progress Notes (Signed)
Pt wanting pain med prior to discharge since she has to wait on a prescription to be filled, Hydrocoeone 5=325 given.

## 2020-04-04 NOTE — Anesthesia Procedure Notes (Signed)
Procedure Name: Intubation Performed by: Kelton Pillar, CRNA Pre-anesthesia Checklist: Patient identified, Emergency Drugs available, Suction available and Patient being monitored Patient Re-evaluated:Patient Re-evaluated prior to induction Oxygen Delivery Method: Circle system utilized Preoxygenation: Pre-oxygenation with 100% oxygen Induction Type: IV induction and Rapid sequence Ventilation: Mask ventilation without difficulty Laryngoscope Size: McGraph and 3 Grade View: Grade I Tube size: 7.0 mm Number of attempts: 1 Airway Equipment and Method: Stylet and Video-laryngoscopy Placement Confirmation: ETT inserted through vocal cords under direct vision,  CO2 detector,  breath sounds checked- equal and bilateral and positive ETCO2 Secured at: 21 cm Tube secured with: Tape Dental Injury: Teeth and Oropharynx as per pre-operative assessment

## 2020-04-04 NOTE — Discharge Instructions (Addendum)
AMBULATORY SURGERY  DISCHARGE INSTRUCTIONS   1) The drugs that you were given will stay in your system until tomorrow so for the next 24 hours you should not:  A) Drive an automobile B) Make any legal decisions C) Drink any alcoholic beverage   2) You may resume regular meals tomorrow.  Today it is better to start with liquids and gradually work up to solid foods.  You may eat anything you prefer, but it is better to start with liquids, then soup and crackers, and gradually work up to solid foods.   3) Please notify your doctor immediately if you have any unusual bleeding, trouble breathing, redness and pain at the surgery site, drainage, fever, or pain not relieved by medication.    4) Additional Instructions:        Please contact your physician with any problems or Same Day Surgery at [phone], Monday through Friday 6 am to 4 pm, or [place] [facility] number at [phone].Orthopedic discharge instructions: Keep dressing dry and intact. Keep hand elevated above heart level. May shower after dressing removed on postop day 4 (Sunday). Cover sutures with Band-Aids after drying off. Apply ice to affected area frequently. Take ibuprofen 600-800 mg TID with meals for 7-10 days, then as necessary. Take ES Tylenol or pain medication as prescribed when needed.  Return for follow-up in 10-14 days or as scheduled. 

## 2020-05-14 DIAGNOSIS — E1322 Other specified diabetes mellitus with diabetic chronic kidney disease: Secondary | ICD-10-CM | POA: Diagnosis present

## 2020-05-17 ENCOUNTER — Other Ambulatory Visit: Payer: Self-pay | Admitting: Surgery

## 2020-05-17 DIAGNOSIS — M7581 Other shoulder lesions, right shoulder: Secondary | ICD-10-CM

## 2020-05-17 DIAGNOSIS — M19011 Primary osteoarthritis, right shoulder: Secondary | ICD-10-CM

## 2020-05-31 DIAGNOSIS — G4733 Obstructive sleep apnea (adult) (pediatric): Secondary | ICD-10-CM

## 2020-06-06 ENCOUNTER — Ambulatory Visit
Admission: RE | Admit: 2020-06-06 | Discharge: 2020-06-06 | Disposition: A | Discharge status: Home / Self Care | Payer: Medicare Other | Source: Ambulatory Visit | Attending: Surgery | Admitting: Surgery

## 2020-06-06 ENCOUNTER — Other Ambulatory Visit: Payer: Self-pay

## 2020-06-06 DIAGNOSIS — M7581 Other shoulder lesions, right shoulder: Secondary | ICD-10-CM | POA: Insufficient documentation

## 2020-06-06 DIAGNOSIS — M19011 Primary osteoarthritis, right shoulder: Secondary | ICD-10-CM | POA: Insufficient documentation

## 2020-09-18 ENCOUNTER — Other Ambulatory Visit: Payer: Self-pay | Admitting: Surgery

## 2020-09-18 DIAGNOSIS — E611 Iron deficiency: Secondary | ICD-10-CM | POA: Insufficient documentation

## 2020-09-27 ENCOUNTER — Encounter
Admission: RE | Admit: 2020-09-27 | Discharge: 2020-09-27 | Disposition: A | Discharge status: Home / Self Care | Payer: Medicare Other | Source: Ambulatory Visit | Attending: Surgery | Admitting: Surgery

## 2020-09-27 ENCOUNTER — Other Ambulatory Visit: Payer: Self-pay

## 2020-09-27 ENCOUNTER — Encounter (HOSPITAL_COMMUNITY): Payer: Self-pay | Admitting: Urgent Care

## 2020-09-27 ENCOUNTER — Other Ambulatory Visit: Payer: Self-pay | Admitting: Orthopedic Surgery

## 2020-09-27 ENCOUNTER — Encounter: Payer: Self-pay | Admitting: Surgery

## 2020-09-27 DIAGNOSIS — Z01812 Encounter for preprocedural laboratory examination: Secondary | ICD-10-CM | POA: Insufficient documentation

## 2020-09-27 DIAGNOSIS — E1110 Type 2 diabetes mellitus with ketoacidosis without coma: Secondary | ICD-10-CM

## 2020-09-27 LAB — COMPREHENSIVE METABOLIC PANEL
ALT: 29 U/L (ref 0–44)
AST: 26 U/L (ref 15–41)
Albumin: 4.1 g/dL (ref 3.5–5.0)
Alkaline Phosphatase: 120 U/L (ref 38–126)
Anion gap: 16 — ABNORMAL HIGH (ref 5–15)
BUN: 37 mg/dL — ABNORMAL HIGH (ref 8–23)
CO2: 25 mmol/L (ref 22–32)
Calcium: 8.7 mg/dL — ABNORMAL LOW (ref 8.9–10.3)
Chloride: 99 mmol/L (ref 98–111)
Creatinine, Ser: 1.58 mg/dL — ABNORMAL HIGH (ref 0.44–1.00)
GFR, Estimated: 34 mL/min — ABNORMAL LOW (ref >60)
Glucose, Bld: 294 mg/dL — ABNORMAL HIGH (ref 70–99)
Potassium: 3.1 mmol/L — ABNORMAL LOW (ref 3.5–5.1)
Sodium: 140 mmol/L (ref 135–145)
Total Bilirubin: 0.7 mg/dL (ref 0.3–1.2)
Total Protein: 7.4 g/dL (ref 6.5–8.1)

## 2020-09-27 LAB — URINALYSIS, ROUTINE W REFLEX MICROSCOPIC
Bilirubin Urine: NEGATIVE
Glucose, UA: >=500 mg/dL — AB
Hgb urine dipstick: NEGATIVE
Ketones, ur: NEGATIVE mg/dL
Nitrite: NEGATIVE
Protein, ur: NEGATIVE mg/dL
Specific Gravity, Urine: 1.005 (ref 1.005–1.030)
pH: 6 (ref 5.0–8.0)

## 2020-09-27 LAB — SURGICAL PCR SCREEN
MRSA, PCR: NEGATIVE
Staphylococcus aureus: NEGATIVE

## 2020-09-27 LAB — HEMOGLOBIN A1C
Hgb A1c MFr Bld: 10.1 % — ABNORMAL HIGH (ref 4.8–5.6)
Mean Plasma Glucose: 243.17 mg/dL

## 2020-09-27 LAB — CBC WITH DIFFERENTIAL/PLATELET
Abs Immature Granulocytes: 0.03 10*3/uL (ref 0.00–0.07)
Basophils Absolute: 0.1 10*3/uL (ref 0.0–0.1)
Basophils Relative: 1 %
Eosinophils Absolute: 0.2 10*3/uL (ref 0.0–0.5)
Eosinophils Relative: 2 %
HCT: 34.7 % — ABNORMAL LOW (ref 36.0–46.0)
Hemoglobin: 11.4 g/dL — ABNORMAL LOW (ref 12.0–15.0)
Immature Granulocytes: 0 %
Lymphocytes Relative: 12 %
Lymphs Abs: 0.8 10*3/uL (ref 0.7–4.0)
MCH: 30.3 pg (ref 26.0–34.0)
MCHC: 32.9 g/dL (ref 30.0–36.0)
MCV: 92.3 fL (ref 80.0–100.0)
Monocytes Absolute: 0.5 10*3/uL (ref 0.1–1.0)
Monocytes Relative: 7 %
Neutro Abs: 5.4 10*3/uL (ref 1.7–7.7)
Neutrophils Relative %: 78 %
Platelets: 236 10*3/uL (ref 150–400)
RBC: 3.76 MIL/uL — ABNORMAL LOW (ref 3.87–5.11)
RDW: 13.2 % (ref 11.5–15.5)
WBC: 6.9 10*3/uL (ref 4.0–10.5)
nRBC: 0 % (ref 0.0–0.2)

## 2020-09-27 NOTE — Pre-Procedure Instructions (Signed)
Left message for the diabetic coordinator that patient is diabetic with an insulin pump with poor control scheduled for surgery on 11/18

## 2020-09-27 NOTE — Patient Instructions (Signed)
Your procedure is scheduled on: Thurs 11/18 Report to the medical mall registration desk if someone is available.  If not go to the 2nd floor surgery desk To find out your arrival time please call [phone] between 1PM - 3PM on Wed. 11/17.  Remember: Instructions that are not followed completely may result in serious medical risk,  up to and including death, or upon the discretion of your surgeon and anesthesiologist your  surgery may need to be rescheduled.     _X__ 1. Do not eat food after midnight the night before your procedure.                 No chewing gum or hard candies. You may drink clear liquids up to 2 hours                 before you are scheduled to arrive for your surgery- DO not drink clear                 liquids within 2 hours of the start of your surgery.                 Clear Liquids include:  water, Black Coffee or Tea (Do not add                 anything to coffee or tea).  __X__2.  On the morning of surgery brush your teeth with toothpaste and water, you                may rinse your mouth with mouthwash if you wish.  Do not swallow any toothpaste of mouthwash.     _X__ 3.  No Alcohol for 24 hours before or after surgery.   ___ 4.  Do Not Smoke or use e-cigarettes For 24 Hours Prior to Your Surgery.                 Do not use any chewable tobacco products for at least 6 hours prior to                 Surgery.  ___  5.  Do not use any recreational drugs (marijuana, cocaine, heroin, ecstasy, MDMA or other)                For at least one week prior to your surgery.  Combination of these drugs with anesthesia                May have life threatening results.  ____  6.  Bring all medications with you on the day of surgery if instructed.   ___x_  7.  Notify your doctor if there is any change in your medical condition      (cold, fever, infections).     Do not wear jewelry, make-up, hairpins, clips or nail polish. Do not wear lotions,  powders, or perfumes. . Do not shave 48 hours prior to surgery. . Do not bring valuables to the hospital.    [facility] is not responsible for any belongings or valuables.  Contacts, dentures or bridgework may not be worn into surgery. Leave your suitcase in the car. After surgery it may be brought to your room. For patients admitted to the hospital, discharge time is determined by your treatment team.   Patients discharged the day of surgery will not be allowed to drive home.   Make arrangements for someone to be with you for the first 24 hours of your Same  Day Discharge.    Please read over the following fact sheets that you were given:   Incentive spirometer   _x___ Take these medicines the morning of surgery with A SIP OF WATER:    1.amLODipine (NORVASC) 5 MG tablet  2. escitalopram (LEXAPRO) 5 MG tablet  3. omeprazole (PRILOSEC) 20 MG capsule  4.  5.  6.  ____ Fleet Enema (as directed)   __x__ Use CHG Soap (or wipes) as directed  ___x_ Use Benzoyl Peroxide Gel as instructed  ____ Use inhalers on the day of surgery  ____ Stop metformin 2 days prior to surgery    _x___ Maintain Basal rate on insulin pump no bolus after evening meal.   __x__ Stop aspirin today  ____ Stop Anti-inflammatories on   May take tyleno   ____ Stop supplements until after surgery.  May continue vitamins  ____ Bring C-Pap to the hospital.    If you have any questions regarding your pre-procedure instructions,  Please call Pre-admit Testing at [facility]

## 2020-09-27 NOTE — Pre-Procedure Instructions (Signed)
Spoke with diabetic coordinator and gave patient's history so she is aware of upcoming surgery.

## 2020-09-28 NOTE — Progress Notes (Signed)
  [facility] Perioperative Services: Pre-Admission/Anesthesia Testing  Abnormal Lab Notification    Date: 09/28/20  Name: Lindsay Glenn MRN:   [id number]  Re: Abnormal labs noted during PAT appointment   Provider(s) Notified: Poggi, Marshall Cork, MD Notification mode: Routed and/or faxed via [place] LAB VALUE(S): Lab Results  Component Value Date   GLUCOSE 294 (H) 09/27/2020    Lab Results  Component Value Date   K 3.1 (L) 09/27/2020   BUN 37 (H) 09/27/2020   CREATININE 1.58 (H) 09/27/2020    Notes: Patient is scheduled for TOTAL SHOULDER ARTHROPLASTY (Right Shoulder) on 10/04/2020.   Patient on daily torsemide 10 mg tablets accounting for the noted hypokalemia.   Will send to surgeon for review and optimization.   Order placed to have K+ rechecked on the day of her procedure to ensure that patient able to undergo a safe anesthetic course.    Patient with a T1DM diagnosis. She is currently on parenteral insulin therapy via a continuous pump. Last Hgb A1c was 10.1% on 09/27/2020.    I have sent communication to her endocrinologist at [facility] for assistance with perioperative glycemic control.   The odds ratio for SSI/PJI infection is between 2.8 and 3.4 for orthopedic surgery patients with pre-operative serum glucose levels of > 125 mg/dL or a post-operative levels of > 200 mg/dL ([place], 2019).     Data suggests that a Hgb A1c threshold of 7.7% tends to be more indicative of infection than the commonly used 7% and should perhaps be the pre-operative patient optimization goal Carolin Guernsey et al., 2017).    This communication is being sent in order to determine if patient is deemed to have adequate preoperative glycemic control in efforts to reduce her risk of developing SSI. The benefit of improving glycemic control must be weighed against the overall risk associated with delaying a necessary elective orthopedic surgery for this patient.    This is a Community education officer; no formal response is required.  Citations: Charlett Blake, A.F. Reducing the risk of infection after total joint arthroplasty: preoperative optimization. Arthroplasty 1, 4 (2019). [email or web address]  Lorrin Goodell MM, [place], Brigati D, [place] SM, [address], [place], [place], [place], [place], Parvizi Lenna Sciara [place]. Determining the Threshold for HbA1c as a Predictor for Adverse Outcomes After Total Joint Arthroplasty: A Multicenter, Retrospective Study. J Arthroplasty. 2017 Sep;32(9S):S263-S267.e1. [email or web address].   Honor Loh, MSN, APRN, FNP-C, CEN [facility]  Peri-operative Services Nurse Practitioner Phone: [phone] 09/28/20 9:03 AM

## 2020-10-02 ENCOUNTER — Other Ambulatory Visit: Payer: Medicare Other

## 2020-10-03 ENCOUNTER — Other Ambulatory Visit: Admission: RE | Admit: 2020-10-03 | Payer: Medicare Other | Source: Ambulatory Visit

## 2020-10-03 LAB — TYPE AND SCREEN
ABO/RH(D): A POS
Antibody Screen: NEGATIVE

## 2020-10-04 ENCOUNTER — Inpatient Hospital Stay: Admission: RE | Admit: 2020-10-04 | Payer: Medicare Other | Source: Home / Self Care | Admitting: Surgery

## 2020-10-04 SURGERY — ARTHROPLASTY, SHOULDER, TOTAL
Anesthesia: Choice | Site: Shoulder | Laterality: Right

## 2021-07-17 DIAGNOSIS — F330 Major depressive disorder, recurrent, mild: Secondary | ICD-10-CM | POA: Insufficient documentation

## 2021-08-23 DIAGNOSIS — I350 Nonrheumatic aortic (valve) stenosis: Secondary | ICD-10-CM | POA: Insufficient documentation

## 2021-08-26 ENCOUNTER — Other Ambulatory Visit: Payer: Self-pay | Admitting: Surgery

## 2021-08-29 ENCOUNTER — Other Ambulatory Visit: Payer: Self-pay

## 2021-08-29 ENCOUNTER — Encounter: Payer: Self-pay | Admitting: Surgery

## 2021-08-29 ENCOUNTER — Other Ambulatory Visit
Admission: RE | Admit: 2021-08-29 | Discharge: 2021-08-29 | Disposition: A | Discharge status: Home / Self Care | Payer: Medicare Other | Source: Ambulatory Visit | Attending: Surgery | Admitting: Surgery

## 2021-08-29 DIAGNOSIS — Z01818 Encounter for other preprocedural examination: Secondary | ICD-10-CM | POA: Diagnosis present

## 2021-08-29 HISTORY — DX: Cardiac murmur, unspecified: R01.1

## 2021-08-29 HISTORY — DX: Other specified postprocedural states: Z98.890

## 2021-08-29 HISTORY — DX: Heart failure, unspecified: I50.9

## 2021-08-29 HISTORY — DX: Pneumonia, unspecified organism: J18.9

## 2021-08-29 HISTORY — DX: Unspecified osteoarthritis, unspecified site: M19.90

## 2021-08-29 HISTORY — DX: Other complications of anesthesia, initial encounter: T88.59XA

## 2021-08-29 HISTORY — DX: Nausea with vomiting, unspecified: R11.2

## 2021-08-29 LAB — CBC WITH DIFFERENTIAL/PLATELET
Abs Immature Granulocytes: 0.06 10*3/uL (ref 0.00–0.07)
Basophils Absolute: 0.1 10*3/uL (ref 0.0–0.1)
Basophils Relative: 2 %
Eosinophils Absolute: 0.3 10*3/uL (ref 0.0–0.5)
Eosinophils Relative: 4 %
HCT: 35.8 % — ABNORMAL LOW (ref 36.0–46.0)
Hemoglobin: 12.2 g/dL (ref 12.0–15.0)
Immature Granulocytes: 1 %
Lymphocytes Relative: 14 %
Lymphs Abs: 1 10*3/uL (ref 0.7–4.0)
MCH: 31.5 pg (ref 26.0–34.0)
MCHC: 34.1 g/dL (ref 30.0–36.0)
MCV: 92.5 fL (ref 80.0–100.0)
Monocytes Absolute: 0.7 10*3/uL (ref 0.1–1.0)
Monocytes Relative: 9 %
Neutro Abs: 5 10*3/uL (ref 1.7–7.7)
Neutrophils Relative %: 70 %
Platelets: 245 10*3/uL (ref 150–400)
RBC: 3.87 MIL/uL (ref 3.87–5.11)
RDW: 13.6 % (ref 11.5–15.5)
WBC: 7.1 10*3/uL (ref 4.0–10.5)
nRBC: 0 % (ref 0.0–0.2)

## 2021-08-29 LAB — COMPREHENSIVE METABOLIC PANEL
ALT: 24 U/L (ref 0–44)
AST: 22 U/L (ref 15–41)
Albumin: 4.3 g/dL (ref 3.5–5.0)
Alkaline Phosphatase: 146 U/L — ABNORMAL HIGH (ref 38–126)
Anion gap: 9 (ref 5–15)
BUN: 29 mg/dL — ABNORMAL HIGH (ref 8–23)
CO2: 27 mmol/L (ref 22–32)
Calcium: 9.3 mg/dL (ref 8.9–10.3)
Chloride: 102 mmol/L (ref 98–111)
Creatinine, Ser: 1.34 mg/dL — ABNORMAL HIGH (ref 0.44–1.00)
GFR, Estimated: 42 mL/min — ABNORMAL LOW (ref >60)
Glucose, Bld: 158 mg/dL — ABNORMAL HIGH (ref 70–99)
Potassium: 3.8 mmol/L (ref 3.5–5.1)
Sodium: 138 mmol/L (ref 135–145)
Total Bilirubin: 0.6 mg/dL (ref 0.3–1.2)
Total Protein: 7.4 g/dL (ref 6.5–8.1)

## 2021-08-29 LAB — URINALYSIS, ROUTINE W REFLEX MICROSCOPIC
Bilirubin Urine: NEGATIVE
Glucose, UA: NEGATIVE mg/dL
Hgb urine dipstick: NEGATIVE
Ketones, ur: NEGATIVE mg/dL
Leukocytes,Ua: NEGATIVE
Nitrite: NEGATIVE
Protein, ur: NEGATIVE mg/dL
Specific Gravity, Urine: 1.005 (ref 1.005–1.030)
pH: 5 (ref 5.0–8.0)

## 2021-08-29 LAB — SURGICAL PCR SCREEN
MRSA, PCR: NEGATIVE
Staphylococcus aureus: NEGATIVE

## 2021-08-29 LAB — TYPE AND SCREEN
ABO/RH(D): A POS
Antibody Screen: NEGATIVE

## 2021-08-29 NOTE — Progress Notes (Addendum)
  Perioperative Services: Pre-Admission/Anesthesia Testing  Abnormal Lab Notification    Date: 08/29/21  Name: Lindsay Glenn MRN:   [id number]  Re: Abnormal labs noted during PAT appointment   Provider(s) Notified: Poggi, Marshall Cork, MD Notification mode: Routed and/or faxed via [place] LAB VALUE(S): Lab Results  Component Value Date   GLUCOSE 158 (H) 08/29/2021    Clinical Notes:  Patient is scheduled for an elective RIGHT TOTAL SHOULDER ARTHROPLASTY on 09/05/2021. Patient with a T2DM diagnosis. She is currently on parenteral (insulin aspart via OmniPod insulin pump) therapies. Last Hgb A1c was 8.7% on 07/31/2021. She had a fructosamine level of 373 umol/L on the same day.     This communication is being sent in order to determine if patient is deemed to have adequate preoperative glycemic control in efforts to reduce her risk of developing SSI/PJI.   The odds ratio for SSI/PJI infection is between 2.8 and 3.4 for orthopedic surgery patients with pre-operative serum glucose levels of > 125 mg/dL or a post-operative levels of > 200 mg/dL ([place], 2019).    Data suggests that a Hgb A1c threshold of 7.7% tends to be more indicative of infection than the commonly used 7% and should perhaps be the pre-operative patient optimization goal Carolin Guernsey et al., 2017).   With that being said, the benefit of improving glycemic control must be weighed against the overall risk associated with delaying a necessary elective orthopedic surgery for this patient.   This is a Community education officer; no formal response is required.  Citations: Charlett Blake, A.F. Reducing the risk of infection after total joint arthroplasty: preoperative optimization. Arthroplasty 1, 4 (2019). [email or web address]  Lorrin Goodell MM, [place], Brigati D, [place] SM, [address] [place], [place], [place], [place], Parvizi Lenna Sciara [place]. Determining the Threshold for  HbA1c as a Predictor for Adverse Outcomes After Total Joint Arthroplasty: A Multicenter, Retrospective Study. J Arthroplasty. 2017 Sep;32(9S):S263-S267.e1. [email or web address].   Honor Loh, MSN, APRN, FNP-C, CEN [facility]  Peri-operative Services Nurse Practitioner Phone: [phone] 08/29/21 2:02 PM

## 2021-08-29 NOTE — Patient Instructions (Addendum)
Your procedure is scheduled on: 09/05/21 - Thursday Report to the Registration Desk on the 1st floor of the [facility]. To find out your arrival time, please call [phone] between 1PM - 3PM on: 09/04/21 - Wednesday Report to [facility] for Covid test on 09/03/21 between 8 am - 1200.  REMEMBER: Instructions that are not followed completely may result in serious medical risk, up to and including death; or upon the discretion of your surgeon and anesthesiologist your surgery may need to be rescheduled.  Do not eat food after midnight the night before surgery.  No gum chewing, lozengers or hard candies.  You may however, drink CLEAR liquids up to 2 hours before you are scheduled to arrive for your surgery. Do not drink anything within 2 hours of your scheduled arrival time.  Clear liquids include: - water   Type 1 and Type 2 diabetics should only drink water.   TAKE THESE MEDICATIONS THE MORNING OF SURGERY WITH A SIP OF WATER:  - carbamazepine (TEGRETOL XR) 100 MG 12 hr tablet - escitalopram (LEXAPRO) 10 MG tablet - metoprolol succinate (TOPROL-XL) 25 MG 24 hr tablet - omeprazole (PRILOSEC) 20 MG , (take one the night before and one on the morning of surgery - helps to prevent nausea after surgery.)  Follow recommendations from Cardiologist, Pulmonologist or PCP regarding stopping Aspirin, Coumadin, Plavix, Eliquis, Pradaxa, or Pletal.    Per Diabetes Coordinator - OmniPod Insulin pump - Reduce Basil rate by 20 % the night before surgery.  One week prior to surgery: Stop Anti-inflammatories (NSAIDS) such as Advil, Aleve, Ibuprofen, Motrin, Naproxen, Naprosyn and Aspirin based products such as Excedrin, Goodys Powder, BC Powder.  Stop ANY OVER THE COUNTER supplements until after surgery.  You may however, continue to take Tylenol if needed for pain up until the day of surgery.  No Alcohol for 24 hours before or after surgery.  No Smoking including e-cigarettes for  24 hours prior to surgery.  No chewable tobacco products for at least 6 hours prior to surgery.  No nicotine patches on the day of surgery.  Do not use any "recreational" drugs for at least a week prior to your surgery.  Please be advised that the combination of cocaine and anesthesia may have negative outcomes, up to and including death. If you test positive for cocaine, your surgery will be cancelled.  On the morning of surgery brush your teeth with toothpaste and water, you may rinse your mouth with mouthwash if you wish. Do not swallow any toothpaste or mouthwash.  Use CHG Soap or wipes as directed on instruction sheet.  Do not wear jewelry, make-up, hairpins, clips or nail polish.  Do not wear lotions, powders, or perfumes.   Do not shave body from the neck down 48 hours prior to surgery just in case you cut yourself which could leave a site for infection.  Also, freshly shaved skin may become irritated if using the CHG soap.  Contact lenses, hearing aids and dentures may not be worn into surgery.  Do not bring valuables to the hospital. [facility] is not responsible for any missing/lost belongings or valuables.   Total Shoulder Arthroplasty:  use Benzolyl Peroxide 5% Gel as directed on instruction sheet.  Notify your doctor if there is any change in your medical condition (cold, fever, infection).  Wear comfortable clothing (specific to your surgery type) to the hospital.  After surgery, you can help prevent lung complications by doing breathing exercises.  Take deep  breaths and cough every 1-2 hours. Your doctor may order a device called an Incentive Spirometer to help you take deep breaths. When coughing or sneezing, hold a pillow firmly against your incision with both hands. This is called "splinting." Doing this helps protect your incision. It also decreases belly discomfort.  If you are being admitted to the hospital overnight, leave your suitcase in the car. After  surgery it may be brought to your room.  If you are being discharged the day of surgery, you will not be allowed to drive home. You will need a responsible adult (18 years or older) to drive you home and stay with you that night.   If you are taking public transportation, you will need to have a responsible adult (18 years or older) with you. Please confirm with your physician that it is acceptable to use public transportation.   Please call the [facility] Dept. at [phone] if you have any questions about these instructions.  Surgery Visitation Policy:  Patients undergoing a surgery or procedure may have one family member or support person with them as long as that person is not COVID-19 positive or experiencing its symptoms.  That person may remain in the waiting area during the procedure and may rotate out with other people.  Inpatient Visitation:    Visiting hours are 7 a.m. to 8 p.m. Up to two visitors ages 16+ are allowed at one time in a patient room. The visitors may rotate out with other people during the day. Visitors must check out when they leave, or other visitors will not be allowed. One designated support person may remain overnight. The visitor must pass COVID-19 screenings, use hand sanitizer when entering and exiting the patient's room and wear a mask at all times, including in the patient's room. Patients must also wear a mask when staff or their visitor are in the room. Masking is required regardless of vaccination status.

## 2021-08-30 NOTE — Progress Notes (Signed)
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 Clinical nursing staff has updated patient's PMH/PSHx, current medication list, and drug allergies/intolerances to ensure comprehensive history available to assist in medical decision making as it pertains to the aforementioned surgical procedure and anticipated anesthetic course. Extensive review of available clinical information performed. Lindsay Glenn PMH and PSHx updated with any diagnoses/procedures that  may have been inadvertently omitted during her intake with the pre-admission testing department's nursing staff.  Clinical Discussion:  Lindsay Glenn is a 75 y.o. female who is submitted for pre-surgical anesthesia review and clearance prior to her undergoing the above procedure. Patient has never been a smoker. Pertinent PMH includes: HFpEF, cardiac murmur, aortic stenosis, CVA/TIA, LADA (managed as T1DM; uses insulin pump), HTN, HLD, Hashimoto's thyroiditis, SOB/DOE, CKD-III, OSAH (no nocturnal PAP therapy), GERD (on daily PPI), gastroparesis, hiatal hernia, IDA, OA, RLS, insomnia, depression.  Patient is followed by cardiology Lindsay Sox, MD). She was last seen in the cardiology clinic on 08/23/2021; notes reviewed.  At the time of her clinic visit, patient doing well overall from a cardiovascular perspective.  She denied any episodes of chest pain, however continued to experience chronic exertional dyspnea. Patient denied any PND,  orthopnea, palpitations, vertiginous symptoms, or presyncope/syncope. Patient experiencing increased lower extremity edema despite diuretic therapy. Patient endorsed increased fast food consumption over the course of the last week, which she feels was the inciting factor in the recent exacerbation. PMH significant for cardiovascular diagnoses.  TTE performed on 02/07/2020 revealed normal left ventricular systolic function with moderate LVH.  LVEF was estimated at 55%.  There was trivial mitral valve regurgitation noted.  Additionally, there was mild stenosis of the aortic valve; mean gradient 8 mmHg; AVA (VTI) = 1.7 cm.  Repeat TTE performed on 08/26/2021 revealed normal left ventricular systolic function with moderate LVH.  Estimated LVEF was 55%.  There was mild mitral, tricuspid, and pulmonary valve regurgitation.  Aortic valve moderately thickened with evidence of mild stenosis; mean gradient 21 mmHg; AVA (VTI) = 1.7 cm.  There was mild biatrial enlargement.  Blood pressure reasonably controlled at 130/76 on currently prescribed CCB, ARB, and diuretic therapies.  Patient is on a statin for HLD.  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  Patient is on a continuous insulin pump.  Diabetes suboptimally controlled; last Hgb A1c was 8.7% on 07/31/2021.  Additionally, patient had fructosamine testing on 07/31/2021 that was elevated at 373 umol/L, which corresponds with an average serum glucose of between 210-240 mg/dL. Functional capacity, as defined by DASI, is documented as being >/= 4 METS.  Patient was started on beta-blocker therapy (metoprolol succinate 12.5 mg daily) for cardioprotection.  No other changes were made to her medication regimen.  Patient to follow-up with outpatient cardiology in 3 months or sooner if needed.  Lindsay Glenn is scheduled for an elective RIGHT TOTAL SHOULDER ARTHROPLASTY on 09/05/2021 with Dr. Milagros Evener, MD.  Given  patient's past medical history significant  for cardiovascular diagnoses, presurgical cardiac clearance was sought by the PAT team.  Per cardiology, "RCRI score of 2, which confers a 10.1% perioperative risk for MACE.  Patient does not have any overt signs or symptoms of heart failure currently, though she does have some worsening lower extremity edema in the setting of dietary indiscretions in the last week.  Her exam was notable for murmur consistent with aortic stenosis.  Ultimately, I do not feel that she needs stress testing prior to her surgery. Recent echo ok. Able to do 4 METS. There are no other apparent modifiable risk factors. May proceed at MODERATE risk". Recommend cardiovascular study performed as per above. This patient is on daily antiplatelet therapy. Cardiology asking that patient continue her daily low dose ASA throughout the perioperative period.   Patient reports previous perioperative complications with anesthesia in the past. She has a history of (+) aspiration during a routine EGD and also has experienced (+) PONV. Order placed by PAT for patient to receive a prophylactic preoperative dose of aprepitant. In review of the available records, it is noted that patient underwent a MAC anesthetic course at [facility] ([facility] in 07/2020 without documented complications.  Of note, patient has an insulin pump in place.   diabetes coordinator has been contacted and recommendation was for patient to reduce basal rate by 20% at midnight prior to surgery.  Consult order entered by PAT APP for inpatient diabetes coordinator consult on the day of her procedure to follow her throughout admission.  Vitals with BMI 08/29/2021 09/27/2020 04/04/2020  Height _0  _1  -  Weight 192 lbs 14 oz 195 lbs 8 oz -  BMI 06.26 94.85 -  Systolic 462 703 500  Diastolic 65 54 63  Pulse 71 71 63    Providers/Specialists:   NOTE: Primary physician provider listed below. Patient  may have been seen by APP or partner within same practice.   PROVIDER ROLE / SPECIALTY LAST OV  Lindsay Glenn, Lindsay Cork, MD ORTHOPEDICS (SURGEON) 08/21/2021  Jonathon Jordan, MD PRIMARY CARE PROVIDER 01/22/2021  Lindsay Angelica, MD CARDIOLOGY 08/23/2021  Lindsay Girt, MD ENDOCRINOLOGY 05/07/2021   Allergies:  Empagliflozin, Tape, Atorvastatin, Furosemide, Lisinopril, Rosuvastatin, and Trulicity [dulaglutide]  Current Home Medications:   No current facility-administered medications for this encounter.    amLODipine (NORVASC) 10 MG tablet   aspirin EC 81 MG tablet   b complex vitamins tablet   carbamazepine (TEGRETOL XR) 100 MG 12 hr tablet   Cholecalciferol (VITAMIN D) 50 MCG (2000 UT) tablet   escitalopram (LEXAPRO) 10 MG tablet   insulin aspart (NOVOLOG) 100 UNIT/ML injection   losartan (COZAAR) 50 MG tablet   Multiple Vitamins-Minerals (MULTIVITAMIN WITH MINERALS) tablet   omeprazole (PRILOSEC) 20 MG capsule   pravastatin (PRAVACHOL) 80 MG tablet   rOPINIRole (REQUIP) 2 MG tablet   tiZANidine (ZANAFLEX) 2 MG tablet   torsemide (DEMADEX) 10 MG tablet   torsemide (DEMADEX) 5 MG tablet   HYDROcodone-acetaminophen (NORCO) 5-325 MG tablet   Insulin Human (INSULIN PUMP) SOLN   metoprolol succinate (TOPROL-XL) 25 MG 24 hr tablet   naproxen sodium (ALEVE) 220 MG tablet   History:   Past Medical History:  Diagnosis Date   (HFpEF) heart failure with preserved ejection fraction ([place])    a.) TTE 02/07/2020: EF 55%. b.) TTE 08/26/2021: EF >55%; mild BAE   Aortic stenosis    a.) TTE 02/07/2020: EF 55%; trivial MR; mild AS with MPG 8 mmHg. b.) TTE 08/26/2021:  EF >55%; mild MR/TR/PR; mild BAE; mild AS with MPG 21 mmHg.   Arthritis    BILATERAL rotator cuff tendinitis    CKD (chronic kidney disease), stage III (HCC)    Complication of anesthesia    a.) (+) aspiration even during colonoscopy   CTS (carpal tunnel syndrome)    Depression    DOE (dyspnea on exertion)    Dyspnea     Gastroparesis    GERD (gastroesophageal reflux disease)    Hashimoto's thyroiditis    Heart murmur    History of hiatal hernia    HLD (hyperlipidemia)    Hypertension    IDA (iron deficiency anemia)    Insomnia    LADA (latent autoimmune diabetes in adults), managed as type 1 ([place])    a.) uses insulin pump   Obesity    Osteopenia    Osteoporosis    Pancreatic cyst    Pneumonia    PONV (postoperative nausea and vomiting)    Pseudophakia of both eyes    Renal cyst, right    Restless leg syndrome    Sleep apnea    a.) unable to tolerate nocturnal PAP therapy   Stroke ([place]) 2017   field of vision lower on right eye   TIA (transient ischemic attack) 2017   Past Surgical History:  Procedure Laterality Date   ABDOMINAL HYSTERECTOMY  1994   ANKLE FRACTURE SURGERY Right 2004   screws;plate   BRACHIOPLASTY Bilateral 2018   fat removal from both upper arms   BREAST SURGERY  2000   bilateral mastectomy   CARPAL TUNNEL RELEASE Right 2004   CARPAL TUNNEL RELEASE Right 04/04/2020   Procedure: OPEN CARPAL TUNNEL RELEASE;  Surgeon: Lindsay Mull, MD;  Location: [facility] ORS;  Service: Orthopedics;  Laterality: Right;   COLONOSCOPY     JOINT REPLACEMENT Bilateral    knee   SHOULDER ARTHROSCOPY Right 2016   UPPER ESOPHAGEAL ENDOSCOPIC ULTRASOUND (EUS)  2020   pancreas   Family History  Problem Relation Age of Onset   Alzheimer's disease Mother    Cancer Father    Social History   Tobacco Use   Smoking status: Never   Smokeless tobacco: Never  Vaping Use   Vaping Use: Never used  Substance Use Topics   Alcohol use: Yes    Comment: rarely   Drug use: Never    Pertinent Clinical Results:  LABS: Labs reviewed: Acceptable for surgery.  Hospital Outpatient Visit on 08/29/2021  Component Date Value Ref Range Status   WBC 08/29/2021 7.1  4.0 - 10.5 K/uL Final   RBC 08/29/2021 3.87  3.87 - 5.11 MIL/uL Final   Hemoglobin 08/29/2021 12.2  12.0 - 15.0 g/dL Final   HCT 08/29/2021  35.8 (A) 36.0 - 46.0 % Final   MCV 08/29/2021 92.5  80.0 - 100.0 fL Final   MCH 08/29/2021 31.5  26.0 - 34.0 pg Final   MCHC 08/29/2021 34.1  30.0 - 36.0 g/dL Final   RDW 08/29/2021 13.6  11.5 - 15.5 % Final   Platelets 08/29/2021 245  150 - 400 K/uL Final   nRBC 08/29/2021 0.0  0.0 - 0.2 % Final   Neutrophils Relative % 08/29/2021 70  % Final   Neutro Abs 08/29/2021 5.0  1.7 - 7.7 K/uL Final   Lymphocytes Relative 08/29/2021 14  % Final   Lymphs Abs 08/29/2021 1.0  0.7 - 4.0 K/uL Final   Monocytes Relative 08/29/2021 9  % Final   Monocytes Absolute 08/29/2021 0.7  0.1 - 1.0 K/uL Final   Eosinophils Relative 08/29/2021 4  % Final   Eosinophils Absolute 08/29/2021 0.3  0.0 - 0.5 K/uL Final   Basophils Relative 08/29/2021 2  % Final   Basophils Absolute 08/29/2021 0.1  0.0 - 0.1 K/uL Final   Immature Granulocytes 08/29/2021 1  % Final   Abs Immature Granulocytes 08/29/2021 0.06  0.00 - 0.07 K/uL Final   Performed at [facility], [place], [place] [address]   Sodium 08/29/2021 138  135 - 145 mmol/L Final   Potassium 08/29/2021 3.8  3.5 - 5.1 mmol/L Final   Chloride 08/29/2021 102  98 - 111 mmol/L Final   CO2 08/29/2021 27  22 - 32 mmol/L Final   Glucose, Bld 08/29/2021 158 (A) 70 - 99 mg/dL Final   Glucose reference range applies only to samples taken after fasting for at least 8 hours.   BUN 08/29/2021 29 (A) 8 - 23 mg/dL Final   Creatinine, Ser 08/29/2021 1.34 (A) 0.44 - 1.00 mg/dL Final   Calcium 08/29/2021 9.3  8.9 - 10.3 mg/dL Final   Total Protein 08/29/2021 7.4  6.5 - 8.1 g/dL Final   Albumin 08/29/2021 4.3  3.5 - 5.0 g/dL Final   AST 08/29/2021 22  15 - 41 U/L Final   ALT 08/29/2021 24  0 - 44 U/L Final   Alkaline Phosphatase 08/29/2021 146 (A) 38 - 126 U/L Final   Total Bilirubin 08/29/2021 0.6  0.3 - 1.2 mg/dL Final   GFR, Estimated 08/29/2021 42 (A) >60 mL/min Final   Comment: (NOTE) Calculated using the CKD-EPI Creatinine Equation (2021)     Anion gap 08/29/2021 9  5 - 15 Final   Performed at [facility], [place], [place] [address]   Color, Urine 08/29/2021 STRAW (A) YELLOW Final   APPearance 08/29/2021 CLEAR (A) CLEAR Final   Specific Gravity, Urine 08/29/2021 1.005  1.005 - 1.030 Final   pH 08/29/2021 5.0  5.0 - 8.0 Final   Glucose, UA 08/29/2021 NEGATIVE  NEGATIVE mg/dL Final   Hgb urine dipstick 08/29/2021 NEGATIVE  NEGATIVE Final   Bilirubin Urine 08/29/2021 NEGATIVE  NEGATIVE Final   Ketones, ur 08/29/2021 NEGATIVE  NEGATIVE mg/dL Final   Protein, ur 08/29/2021 NEGATIVE  NEGATIVE mg/dL Final   Nitrite 08/29/2021 NEGATIVE  NEGATIVE Final   Leukocytes,Ua 08/29/2021 NEGATIVE  NEGATIVE Final   Performed at [facility], [address] [place], [place] [address]   ABO/RH(D) 08/29/2021 A POS   Final   Antibody Screen 08/29/2021 NEG   Final   Sample Expiration 08/29/2021 09/12/2021,2359   Final   Extend sample reason 08/29/2021    Final                   Value:NO TRANSFUSIONS OR PREGNANCY IN THE PAST 3 MONTHS Performed at [facility], [address] [place], [place] [address]    MRSA, PCR 08/29/2021 NEGATIVE  NEGATIVE Final   Staphylococcus aureus 08/29/2021 NEGATIVE  NEGATIVE Final   Comment: (NOTE) The Xpert SA Assay (FDA approved for NASAL specimens in patients 43 years of age and older), is one component of a comprehensive surveillance program. It is not intended to diagnose infection nor to guide or monitor treatment. Performed at [facility], [address] [place],  [address]    ECG: Date: 08/23/2021 Rate: 76 bpm Rhythm: normal sinus Axis (leads I and aVF): Normal Intervals: PR 186 ms. QRS 96 ms. QTc  456 ms. ST segment and T wave changes: No evidence of acute ST segment elevation or depression Comparison: Similar to previous tracing obtained on 03/28/2020 NOTE: Tracing obtained at [facility]; unable for review. Above based on  cardiologist's interpretation.    IMAGING / PROCEDURES: TRANSTHORACIC ECHOCARDIOGRAM performed on 08/26/2021 LVEF >55% Normal left ventricular systolic function with moderate LVH Normal right ventricular systolic function Mild MR, TR, PR No AR Mild aortic stenosis with a MPG of 21.0 mmHg; AVA (VTI) = 1.7 cm Mild biatrial enlargement No evidence of pericardial effusion  DIAGNOSTIC RADIOGRAPHS RIGHT SHOULDER MINIMAL 2 VIEWS performed on 08/21/2021 Significant glenohumeral degenerative joint changes with bone-on-bone pathology No high riding humeral head Very minimal AC joint degenerative changes  Impression and Plan:  Lechelle Rahimi has been referred for pre-anesthesia review and clearance prior to her undergoing the planned anesthetic and procedural courses. Available labs, pertinent testing, and imaging results were personally reviewed by me. This patient has been appropriately cleared by cardiology with an overall MODERATE risk of significant perioperative cardiovascular complications.  Patient with LADA diagnosis managed as type 1 diabetes.  Order entered for diabetes coordinator consult on day of procedure and throughout inpatient stay.  Based on clinical review performed today (08/30/21), barring any significant acute changes in the patient's overall condition, it is anticipated that she will be able to proceed with the planned surgical intervention. Any acute changes in clinical condition may necessitate her procedure being postponed and/or cancelled. Patient will meet with anesthesia team (MD and/or CRNA) on the day of her procedure for preoperative evaluation/assessment. Questions regarding anesthetic course will be fielded at that time.   Pre-surgical instructions were reviewed with the patient during her PAT appointment and questions were fielded by PAT clinical staff. Patient was advised that if any questions or concerns arise prior to her procedure then she should return a call to PAT  and/or her surgeon's office to discuss.  Honor Loh, MSN, APRN, FNP-C, CEN [facility]  Peri-operative Services Nurse Practitioner Phone: [phone] Fax: [phone] 08/30/21 1:33 PM  NOTE: This note has been prepared using Dragon dictation software. Despite my best ability to proofread, there is always the potential that unintentional transcriptional errors may still occur from this process.

## 2021-09-03 ENCOUNTER — Other Ambulatory Visit
Admission: RE | Admit: 2021-09-03 | Discharge: 2021-09-03 | Disposition: A | Discharge status: Home / Self Care | Payer: Medicare Other | Source: Ambulatory Visit | Attending: Surgery | Admitting: Surgery

## 2021-09-03 ENCOUNTER — Other Ambulatory Visit: Payer: Self-pay

## 2021-09-03 DIAGNOSIS — Z01812 Encounter for preprocedural laboratory examination: Secondary | ICD-10-CM | POA: Diagnosis present

## 2021-09-03 DIAGNOSIS — Z20822 Contact with and (suspected) exposure to covid-19: Secondary | ICD-10-CM | POA: Insufficient documentation

## 2021-09-03 LAB — SARS CORONAVIRUS 2 (TAT 6-24 HRS): SARS Coronavirus 2: NEGATIVE

## 2021-09-05 ENCOUNTER — Inpatient Hospital Stay: Payer: Medicare Other

## 2021-09-05 ENCOUNTER — Other Ambulatory Visit: Payer: Self-pay

## 2021-09-05 ENCOUNTER — Encounter: Payer: Self-pay | Admitting: Surgery

## 2021-09-05 ENCOUNTER — Inpatient Hospital Stay: Payer: Medicare Other | Admitting: Urgent Care

## 2021-09-05 ENCOUNTER — Observation Stay
Admission: RE | Admit: 2021-09-05 | Discharge: 2021-09-06 | Disposition: A | Discharge status: Home / Self Care | Payer: Medicare Other | Source: Ambulatory Visit | Attending: Surgery | Admitting: Surgery

## 2021-09-05 ENCOUNTER — Encounter
Admission: RE | Disposition: A | Discharge status: Home / Self Care | Payer: Self-pay | Source: Ambulatory Visit | Attending: Surgery

## 2021-09-05 DIAGNOSIS — Z853 Personal history of malignant neoplasm of breast: Secondary | ICD-10-CM | POA: Insufficient documentation

## 2021-09-05 DIAGNOSIS — Z96653 Presence of artificial knee joint, bilateral: Secondary | ICD-10-CM | POA: Insufficient documentation

## 2021-09-05 DIAGNOSIS — Z79899 Other long term (current) drug therapy: Secondary | ICD-10-CM | POA: Insufficient documentation

## 2021-09-05 DIAGNOSIS — I130 Hypertensive heart and chronic kidney disease with heart failure and stage 1 through stage 4 chronic kidney disease, or unspecified chronic kidney disease: Secondary | ICD-10-CM | POA: Diagnosis not present

## 2021-09-05 DIAGNOSIS — I509 Heart failure, unspecified: Secondary | ICD-10-CM | POA: Insufficient documentation

## 2021-09-05 DIAGNOSIS — Z8673 Personal history of transient ischemic attack (TIA), and cerebral infarction without residual deficits: Secondary | ICD-10-CM | POA: Diagnosis not present

## 2021-09-05 DIAGNOSIS — Z794 Long term (current) use of insulin: Secondary | ICD-10-CM | POA: Diagnosis not present

## 2021-09-05 DIAGNOSIS — M19011 Primary osteoarthritis, right shoulder: Principal | ICD-10-CM | POA: Insufficient documentation

## 2021-09-05 DIAGNOSIS — E1122 Type 2 diabetes mellitus with diabetic chronic kidney disease: Secondary | ICD-10-CM | POA: Insufficient documentation

## 2021-09-05 DIAGNOSIS — N1832 Chronic kidney disease, stage 3b: Secondary | ICD-10-CM | POA: Diagnosis not present

## 2021-09-05 DIAGNOSIS — M25511 Pain in right shoulder: Secondary | ICD-10-CM

## 2021-09-05 DIAGNOSIS — G4733 Obstructive sleep apnea (adult) (pediatric): Secondary | ICD-10-CM

## 2021-09-05 DIAGNOSIS — Z7982 Long term (current) use of aspirin: Secondary | ICD-10-CM | POA: Insufficient documentation

## 2021-09-05 DIAGNOSIS — Z96611 Presence of right artificial shoulder joint: Secondary | ICD-10-CM

## 2021-09-05 HISTORY — DX: Other specified diabetes mellitus without complications: E13.9

## 2021-09-05 HISTORY — DX: Presence of intraocular lens: Z96.1

## 2021-09-05 HISTORY — PX: TOTAL SHOULDER ARTHROPLASTY: SHX126

## 2021-09-05 HISTORY — DX: Iron deficiency anemia, unspecified: D50.9

## 2021-09-05 HISTORY — DX: Carpal tunnel syndrome, unspecified upper limb: G56.00

## 2021-09-05 HISTORY — DX: Cyst of kidney, acquired: N28.1

## 2021-09-05 HISTORY — DX: Depression, unspecified: F32.A

## 2021-09-05 HISTORY — DX: Chronic kidney disease, stage 3 unspecified: N18.30

## 2021-09-05 HISTORY — DX: Nonrheumatic aortic (valve) stenosis: I35.0

## 2021-09-05 HISTORY — DX: Hyperlipidemia, unspecified: E78.5

## 2021-09-05 HISTORY — DX: Other forms of dyspnea: R06.09

## 2021-09-05 HISTORY — DX: Unspecified diastolic (congestive) heart failure: I50.30

## 2021-09-05 HISTORY — DX: Age-related osteoporosis without current pathological fracture: M81.0

## 2021-09-05 HISTORY — DX: Other shoulder lesions, unspecified shoulder: M75.80

## 2021-09-05 LAB — GLUCOSE, CAPILLARY
Glucose-Capillary: 149 mg/dL — ABNORMAL HIGH (ref 70–99)
Glucose-Capillary: 236 mg/dL — ABNORMAL HIGH (ref 70–99)
Glucose-Capillary: 312 mg/dL — ABNORMAL HIGH (ref 70–99)

## 2021-09-05 SURGERY — ARTHROPLASTY, SHOULDER, TOTAL
Anesthesia: Regional | Site: Shoulder | Laterality: Right

## 2021-09-05 MED ORDER — FENTANYL CITRATE (PF) 100 MCG/2ML IJ SOLN
INTRAMUSCULAR | Status: AC
  Filled 2021-09-05: qty 2

## 2021-09-05 MED ORDER — PHENYLEPHRINE HCL (PRESSORS) 10 MG/ML IV SOLN
INTRAVENOUS | Status: AC
  Filled 2021-09-05: qty 1

## 2021-09-05 MED ORDER — SUCCINYLCHOLINE CHLORIDE 200 MG/10ML IV SOSY
PREFILLED_SYRINGE | INTRAVENOUS | Status: DC | PRN
  Administered 2021-09-05: 100 mg via INTRAVENOUS

## 2021-09-05 MED ORDER — DEXAMETHASONE SODIUM PHOSPHATE 10 MG/ML IJ SOLN
INTRAMUSCULAR | Status: DC | PRN
  Administered 2021-09-05: 5 mg via INTRAVENOUS

## 2021-09-05 MED ORDER — ONDANSETRON HCL 4 MG PO TABS: 4.0000 mg | ORAL_TABLET | Freq: Four times a day (QID) | ORAL | Status: DC | PRN

## 2021-09-05 MED ORDER — TRAMADOL HCL 50 MG PO TABS: 50.0000 mg | ORAL_TABLET | Freq: Four times a day (QID) | ORAL | Status: DC | PRN

## 2021-09-05 MED ORDER — PHENYLEPHRINE HCL-NACL 20-0.9 MG/250ML-% IV SOLN
INTRAVENOUS | Status: DC | PRN
Start: 2021-09-05 — End: 2021-09-05
  Administered 2021-09-05: 15 ug/min via INTRAVENOUS

## 2021-09-05 MED ORDER — ASPIRIN EC 81 MG PO TBEC
81.0000 mg | DELAYED_RELEASE_TABLET | Freq: Every day | ORAL | Status: DC
  Administered 2021-09-06: 81 mg via ORAL
  Filled 2021-09-05: qty 1

## 2021-09-05 MED ORDER — SEVOFLURANE IN SOLN
RESPIRATORY_TRACT | Status: AC
  Filled 2021-09-05: qty 250

## 2021-09-05 MED ORDER — BISACODYL 10 MG RE SUPP: 10.0000 mg | Freq: Every day | RECTAL | Status: DC | PRN

## 2021-09-05 MED ORDER — ADULT MULTIVITAMIN W/MINERALS CH
1.0000 | ORAL_TABLET | Freq: Every day | ORAL | Status: DC
  Administered 2021-09-06: 1 via ORAL
  Filled 2021-09-05: qty 1

## 2021-09-05 MED ORDER — KETOROLAC TROMETHAMINE 15 MG/ML IJ SOLN
15.0000 mg | Freq: Once | INTRAMUSCULAR | Status: AC
  Administered 2021-09-05: 15 mg via INTRAVENOUS

## 2021-09-05 MED ORDER — FENTANYL CITRATE (PF) 100 MCG/2ML IJ SOLN
INTRAMUSCULAR | Status: DC | PRN
  Administered 2021-09-05 (×2): 50 ug via INTRAVENOUS

## 2021-09-05 MED ORDER — ONDANSETRON HCL 4 MG/2ML IJ SOLN: 4.0000 mg | Freq: Four times a day (QID) | INTRAMUSCULAR | Status: DC | PRN

## 2021-09-05 MED ORDER — ROPINIROLE HCL 1 MG PO TABS
2.0000 mg | ORAL_TABLET | Freq: Every day | ORAL | Status: DC
  Administered 2021-09-05: 2 mg via ORAL
  Filled 2021-09-05: qty 2

## 2021-09-05 MED ORDER — METOCLOPRAMIDE HCL 5 MG/ML IJ SOLN: 5.0000 mg | Freq: Three times a day (TID) | INTRAMUSCULAR | Status: DC | PRN

## 2021-09-05 MED ORDER — ONDANSETRON HCL 4 MG/2ML IJ SOLN: 4.0000 mg | Freq: Once | INTRAMUSCULAR | Status: DC | PRN

## 2021-09-05 MED ORDER — FENTANYL CITRATE (PF) 100 MCG/2ML IJ SOLN: 25.0000 ug | INTRAMUSCULAR | Status: DC | PRN

## 2021-09-05 MED ORDER — PANTOPRAZOLE SODIUM 40 MG PO TBEC
40.0000 mg | DELAYED_RELEASE_TABLET | Freq: Every day | ORAL | Status: DC
  Administered 2021-09-06: 40 mg via ORAL
  Filled 2021-09-05: qty 1

## 2021-09-05 MED ORDER — AMLODIPINE BESYLATE 10 MG PO TABS
10.0000 mg | ORAL_TABLET | Freq: Every day | ORAL | Status: DC
  Administered 2021-09-06: 10 mg via ORAL
  Filled 2021-09-05: qty 1

## 2021-09-05 MED ORDER — TIZANIDINE HCL 2 MG PO TABS
2.0000 mg | ORAL_TABLET | Freq: Every evening | ORAL | Status: DC | PRN
  Filled 2021-09-05: qty 1

## 2021-09-05 MED ORDER — SODIUM CHLORIDE 0.9 % IV SOLN: INTRAVENOUS | Status: DC

## 2021-09-05 MED ORDER — OXYCODONE HCL 5 MG PO TABS
5.0000 mg | ORAL_TABLET | Freq: Once | ORAL | Status: AC | PRN
Start: 2021-09-05 — End: 2021-09-05
  Administered 2021-09-05: 5 mg via ORAL

## 2021-09-05 MED ORDER — B COMPLEX-C PO TABS
1.0000 | ORAL_TABLET | Freq: Every day | ORAL | Status: DC
  Administered 2021-09-06: 1 via ORAL
  Filled 2021-09-05: qty 1

## 2021-09-05 MED ORDER — VITAMIN D 25 MCG (1000 UNIT) PO TABS
2000.0000 [IU] | ORAL_TABLET | Freq: Every day | ORAL | Status: DC
  Administered 2021-09-06: 2000 [IU] via ORAL
  Filled 2021-09-05: qty 2

## 2021-09-05 MED ORDER — PROPOFOL 1000 MG/100ML IV EMUL
INTRAVENOUS | Status: AC
  Filled 2021-09-05: qty 100

## 2021-09-05 MED ORDER — CEFAZOLIN SODIUM-DEXTROSE 2-4 GM/100ML-% IV SOLN
2.0000 g | INTRAVENOUS | Status: AC
  Administered 2021-09-05: 2 g via INTRAVENOUS

## 2021-09-05 MED ORDER — MAGNESIUM HYDROXIDE 400 MG/5ML PO SUSP: 30.0000 mL | Freq: Every day | ORAL | Status: DC | PRN

## 2021-09-05 MED ORDER — SODIUM CHLORIDE FLUSH 0.9 % IV SOLN
INTRAVENOUS | Status: AC
  Filled 2021-09-05: qty 40

## 2021-09-05 MED ORDER — BUPIVACAINE LIPOSOME 1.3 % IJ SUSP
INTRAMUSCULAR | Status: AC
  Filled 2021-09-05: qty 10

## 2021-09-05 MED ORDER — APREPITANT 40 MG PO CAPS
ORAL_CAPSULE | ORAL | Status: AC
  Administered 2021-09-05: 40 mg
  Filled 2021-09-05: qty 1

## 2021-09-05 MED ORDER — FENTANYL CITRATE PF 50 MCG/ML IJ SOSY
PREFILLED_SYRINGE | INTRAMUSCULAR | Status: AC
  Administered 2021-09-05: 50 ug via INTRAVENOUS
  Filled 2021-09-05: qty 1

## 2021-09-05 MED ORDER — SODIUM CHLORIDE 0.9 % IR SOLN
Status: DC | PRN
  Administered 2021-09-05: 3000 mL

## 2021-09-05 MED ORDER — PROPOFOL 10 MG/ML IV BOLUS
INTRAVENOUS | Status: AC
  Filled 2021-09-05: qty 20

## 2021-09-05 MED ORDER — KETOROLAC TROMETHAMINE 15 MG/ML IJ SOLN
INTRAMUSCULAR | Status: AC
  Filled 2021-09-05: qty 1

## 2021-09-05 MED ORDER — MIDAZOLAM HCL 2 MG/2ML IJ SOLN: 1.0000 mg | Freq: Once | INTRAMUSCULAR | Status: AC

## 2021-09-05 MED ORDER — OXYCODONE HCL 5 MG/5ML PO SOLN: 5.0000 mg | Freq: Once | ORAL | Status: AC | PRN

## 2021-09-05 MED ORDER — FENTANYL CITRATE PF 50 MCG/ML IJ SOSY: 50.0000 ug | PREFILLED_SYRINGE | Freq: Once | INTRAMUSCULAR | Status: AC

## 2021-09-05 MED ORDER — BUPIVACAINE LIPOSOME 1.3 % IJ SUSP
INTRAMUSCULAR | Status: AC
  Filled 2021-09-05: qty 20

## 2021-09-05 MED ORDER — ACETAMINOPHEN 10 MG/ML IV SOLN
INTRAVENOUS | Status: DC | PRN
  Administered 2021-09-05: 1000 mg via INTRAVENOUS

## 2021-09-05 MED ORDER — BUPIVACAINE LIPOSOME 1.3 % IJ SUSP
INTRAMUSCULAR | Status: DC | PRN
  Administered 2021-09-05: 10 mL

## 2021-09-05 MED ORDER — BUPIVACAINE-EPINEPHRINE (PF) 0.5% -1:200000 IJ SOLN
INTRAMUSCULAR | Status: DC | PRN
  Administered 2021-09-05: 20 mL

## 2021-09-05 MED ORDER — PROPOFOL 10 MG/ML IV BOLUS
INTRAVENOUS | Status: DC | PRN
  Administered 2021-09-05: 150 mg via INTRAVENOUS

## 2021-09-05 MED ORDER — ORAL CARE MOUTH RINSE: 15.0000 mL | Freq: Once | OROMUCOSAL | Status: AC

## 2021-09-05 MED ORDER — LOSARTAN POTASSIUM 50 MG PO TABS
50.0000 mg | ORAL_TABLET | Freq: Every day | ORAL | Status: DC
  Administered 2021-09-05: 50 mg via ORAL
  Filled 2021-09-05: qty 1

## 2021-09-05 MED ORDER — VASOPRESSIN 20 UNIT/ML IV SOLN
INTRAVENOUS | Status: DC | PRN
  Administered 2021-09-05 (×2): 2 [IU] via INTRAVENOUS

## 2021-09-05 MED ORDER — ENOXAPARIN SODIUM 40 MG/0.4ML IJ SOSY
40.0000 mg | PREFILLED_SYRINGE | INTRAMUSCULAR | Status: DC
  Filled 2021-09-05: qty 0.4

## 2021-09-05 MED ORDER — ESCITALOPRAM OXALATE 10 MG PO TABS
10.0000 mg | ORAL_TABLET | Freq: Every day | ORAL | Status: DC
  Administered 2021-09-06: 10 mg via ORAL
  Filled 2021-09-05: qty 1

## 2021-09-05 MED ORDER — ACETAMINOPHEN 10 MG/ML IV SOLN: 1000.0000 mg | Freq: Once | INTRAVENOUS | Status: DC | PRN

## 2021-09-05 MED ORDER — GLYCOPYRROLATE 0.2 MG/ML IJ SOLN
INTRAMUSCULAR | Status: DC | PRN
  Administered 2021-09-05: .2 mg via INTRAVENOUS

## 2021-09-05 MED ORDER — HYDROMORPHONE HCL 1 MG/ML IJ SOLN: 0.2500 mg | INTRAMUSCULAR | Status: DC | PRN

## 2021-09-05 MED ORDER — CEFAZOLIN SODIUM-DEXTROSE 2-4 GM/100ML-% IV SOLN
2.0000 g | Freq: Four times a day (QID) | INTRAVENOUS | Status: AC
Start: 2021-09-05 — End: 2021-09-06
  Administered 2021-09-05 (×2): 2 g via INTRAVENOUS
  Filled 2021-09-05 (×3): qty 100

## 2021-09-05 MED ORDER — ONDANSETRON HCL 4 MG/2ML IJ SOLN
INTRAMUSCULAR | Status: DC | PRN
  Administered 2021-09-05: 4 mg via INTRAVENOUS

## 2021-09-05 MED ORDER — METOCLOPRAMIDE HCL 10 MG PO TABS: 5.0000 mg | ORAL_TABLET | Freq: Three times a day (TID) | ORAL | Status: DC | PRN

## 2021-09-05 MED ORDER — KETOROLAC TROMETHAMINE 15 MG/ML IJ SOLN
7.5000 mg | Freq: Four times a day (QID) | INTRAMUSCULAR | Status: DC
  Administered 2021-09-05 – 2021-09-06 (×2): 7.5 mg via INTRAVENOUS
  Filled 2021-09-05 (×3): qty 1

## 2021-09-05 MED ORDER — TORSEMIDE 10 MG PO TABS: 5.0000 mg | ORAL_TABLET | Freq: Every day | ORAL | Status: DC

## 2021-09-05 MED ORDER — PHENYLEPHRINE HCL (PRESSORS) 10 MG/ML IV SOLN
INTRAVENOUS | Status: DC | PRN
  Administered 2021-09-05: 80 ug via INTRAVENOUS

## 2021-09-05 MED ORDER — OXYCODONE HCL 5 MG PO TABS
ORAL_TABLET | ORAL | Status: AC
  Filled 2021-09-05: qty 1

## 2021-09-05 MED ORDER — FLEET ENEMA 7-19 GM/118ML RE ENEM: 1.0000 | ENEMA | Freq: Once | RECTAL | Status: DC | PRN

## 2021-09-05 MED ORDER — TRANEXAMIC ACID 1000 MG/10ML IV SOLN
INTRAVENOUS | Status: DC | PRN
  Administered 2021-09-05: 1000 mg via TOPICAL

## 2021-09-05 MED ORDER — MIDAZOLAM HCL 2 MG/2ML IJ SOLN
INTRAMUSCULAR | Status: AC
  Administered 2021-09-05: 1 mg via INTRAVENOUS
  Filled 2021-09-05: qty 2

## 2021-09-05 MED ORDER — METOPROLOL SUCCINATE ER 25 MG PO TB24
12.5000 mg | ORAL_TABLET | Freq: Every day | ORAL | Status: DC
  Filled 2021-09-05: qty 1

## 2021-09-05 MED ORDER — CEFAZOLIN SODIUM-DEXTROSE 2-4 GM/100ML-% IV SOLN
INTRAVENOUS | Status: AC
  Administered 2021-09-06: 2 g via INTRAVENOUS
  Filled 2021-09-05: qty 100

## 2021-09-05 MED ORDER — INSULIN PUMP
Freq: Three times a day (TID) | SUBCUTANEOUS | Status: DC
  Filled 2021-09-05: qty 1

## 2021-09-05 MED ORDER — TORSEMIDE 10 MG PO TABS
15.0000 mg | ORAL_TABLET | Freq: Every day | ORAL | Status: DC
  Administered 2021-09-06: 15 mg via ORAL
  Filled 2021-09-05: qty 1.5

## 2021-09-05 MED ORDER — MIDAZOLAM HCL 2 MG/2ML IJ SOLN
INTRAMUSCULAR | Status: AC
  Filled 2021-09-05: qty 2

## 2021-09-05 MED ORDER — BUPIVACAINE HCL (PF) 0.5 % IJ SOLN
INTRAMUSCULAR | Status: AC
  Filled 2021-09-05: qty 10

## 2021-09-05 MED ORDER — LIDOCAINE HCL (CARDIAC) PF 100 MG/5ML IV SOSY
PREFILLED_SYRINGE | INTRAVENOUS | Status: DC | PRN
  Administered 2021-09-05: 40 mg via INTRAVENOUS

## 2021-09-05 MED ORDER — SUGAMMADEX SODIUM 500 MG/5ML IV SOLN
INTRAVENOUS | Status: DC | PRN
  Administered 2021-09-05: 200 mg via INTRAVENOUS

## 2021-09-05 MED ORDER — APREPITANT 40 MG PO CAPS: 40.0000 mg | ORAL_CAPSULE | Freq: Once | ORAL | Status: AC

## 2021-09-05 MED ORDER — DIPHENHYDRAMINE HCL 12.5 MG/5ML PO ELIX: 12.5000 mg | ORAL_SOLUTION | ORAL | Status: DC | PRN

## 2021-09-05 MED ORDER — BUPIVACAINE HCL (PF) 0.5 % IJ SOLN
INTRAMUSCULAR | Status: DC | PRN
  Administered 2021-09-05: 10 mL

## 2021-09-05 MED ORDER — TORSEMIDE 10 MG PO TABS: 10.0000 mg | ORAL_TABLET | Freq: Every day | ORAL | Status: DC

## 2021-09-05 MED ORDER — ROCURONIUM BROMIDE 100 MG/10ML IV SOLN
INTRAVENOUS | Status: DC | PRN
  Administered 2021-09-05: 10 mg via INTRAVENOUS
  Administered 2021-09-05: 50 mg via INTRAVENOUS
  Administered 2021-09-05: 10 mg via INTRAVENOUS

## 2021-09-05 MED ORDER — DOCUSATE SODIUM 100 MG PO CAPS
100.0000 mg | ORAL_CAPSULE | Freq: Two times a day (BID) | ORAL | Status: DC
  Administered 2021-09-05 – 2021-09-06 (×3): 100 mg via ORAL
  Filled 2021-09-05 (×3): qty 1

## 2021-09-05 MED ORDER — PROPOFOL 10 MG/ML IV BOLUS
INTRAVENOUS | Status: AC
  Filled 2021-09-05: qty 40

## 2021-09-05 MED ORDER — OXYCODONE HCL 5 MG PO TABS
5.0000 mg | ORAL_TABLET | ORAL | Status: DC | PRN
  Administered 2021-09-05: 5 mg via ORAL
  Filled 2021-09-05: qty 1

## 2021-09-05 MED ORDER — TRANEXAMIC ACID 1000 MG/10ML IV SOLN
INTRAVENOUS | Status: AC
  Filled 2021-09-05: qty 10

## 2021-09-05 MED ORDER — INSULIN ASPART 100 UNIT/ML IJ SOLN: 0.0000 [IU] | Freq: Three times a day (TID) | INTRAMUSCULAR | Status: DC

## 2021-09-05 MED ORDER — ACETAMINOPHEN 500 MG PO TABS
1000.0000 mg | ORAL_TABLET | Freq: Four times a day (QID) | ORAL | Status: DC
  Administered 2021-09-05 – 2021-09-06 (×3): 1000 mg via ORAL
  Filled 2021-09-05 (×3): qty 2

## 2021-09-05 MED ORDER — CHLORHEXIDINE GLUCONATE 0.12 % MT SOLN: 15.0000 mL | Freq: Once | OROMUCOSAL | Status: AC

## 2021-09-05 MED ORDER — CARBAMAZEPINE ER 100 MG PO TB12
100.0000 mg | ORAL_TABLET | Freq: Two times a day (BID) | ORAL | Status: DC
  Administered 2021-09-06: 100 mg via ORAL
  Filled 2021-09-05 (×3): qty 1

## 2021-09-05 MED ORDER — ACETAMINOPHEN 10 MG/ML IV SOLN
INTRAVENOUS | Status: AC
  Filled 2021-09-05: qty 300

## 2021-09-05 MED ORDER — ACETAMINOPHEN 325 MG PO TABS: 325.0000 mg | ORAL_TABLET | Freq: Four times a day (QID) | ORAL | Status: DC | PRN

## 2021-09-05 MED ORDER — CHLORHEXIDINE GLUCONATE 0.12 % MT SOLN
OROMUCOSAL | Status: AC
  Administered 2021-09-05: 15 mL via OROMUCOSAL
  Filled 2021-09-05: qty 15

## 2021-09-05 MED ORDER — INSULIN ASPART 100 UNIT/ML IJ SOLN: 0.0000 [IU] | Freq: Every day | INTRAMUSCULAR | Status: DC

## 2021-09-05 MED ORDER — SODIUM CHLORIDE 0.9 % IV SOLN
INTRAVENOUS | Status: DC | PRN
  Administered 2021-09-05: 100 mL via TOPICAL

## 2021-09-05 MED ORDER — PRAVASTATIN SODIUM 40 MG PO TABS
80.0000 mg | ORAL_TABLET | Freq: Every day | ORAL | Status: DC
  Administered 2021-09-05: 80 mg via ORAL
  Filled 2021-09-05 (×2): qty 2
  Filled 2021-09-05: qty 4

## 2021-09-05 MED ORDER — LACTATED RINGERS IV SOLN: INTRAVENOUS | Status: DC

## 2021-09-05 SURGICAL SUPPLY — 69 items
ANCHOR JUGGERKNOT WTAP NDL 2.9 (Anchor) ×2 IMPLANT
BAG DECANTER FOR FLEXI CONT (MISCELLANEOUS) ×2 IMPLANT
BIT DRILL JUGRKNT W/NDL BIT2.9 (DRILL) ×1 IMPLANT
BLADE SAGITTAL WIDE XTHICK NO (BLADE) ×2 IMPLANT
BODY TRUNION ECLIPSE 39 SL (Shoulder) ×2 IMPLANT
BOWL CEMENT MIX W/ADAPTER (MISCELLANEOUS) ×2 IMPLANT
CEMENT BONE R 1X40 (Cement) ×2 IMPLANT
CHLORAPREP W/TINT 26 (MISCELLANEOUS) ×2 IMPLANT
COOLER POLAR GLACIER W/PUMP (MISCELLANEOUS) ×2 IMPLANT
COVER BACK TABLE REUSABLE LG (DRAPES) ×2 IMPLANT
DRAPE 3/4 80X56 (DRAPES) ×4 IMPLANT
DRAPE IMP U-DRAPE 54X76 (DRAPES) ×4 IMPLANT
DRAPE INCISE IOBAN 66X45 STRL (DRAPES) ×4 IMPLANT
DRILL JUGGERKNOT W/NDL BIT 2.9 (DRILL) ×2
DRSG OPSITE POSTOP 4X8 (GAUZE/BANDAGES/DRESSINGS) ×2 IMPLANT
ELECT BLADE 6.5 EXT (BLADE) ×2 IMPLANT
ELECT CAUTERY BLADE 6.4 (BLADE) ×2 IMPLANT
ELECT REM PT RETURN 9FT ADLT (ELECTROSURGICAL) ×2
ELECTRODE REM PT RTRN 9FT ADLT (ELECTROSURGICAL) ×1 IMPLANT
GAUZE PACK 2X3YD (PACKING) ×2 IMPLANT
GLENOID WITH CLEAT SM (Miscellaneous) ×2 IMPLANT
GLOVE SRG 8 PF TXTR STRL LF DI (GLOVE) ×1 IMPLANT
GLOVE SURG ENC MOIS LTX SZ7.5 (GLOVE) ×8 IMPLANT
GLOVE SURG ENC MOIS LTX SZ8 (GLOVE) ×8 IMPLANT
GLOVE SURG UNDER LTX SZ8 (GLOVE) ×2 IMPLANT
GLOVE SURG UNDER POLY LF SZ8 (GLOVE) ×1
GOWN STRL REUS W/ TWL LRG LVL3 (GOWN DISPOSABLE) ×1 IMPLANT
GOWN STRL REUS W/ TWL XL LVL3 (GOWN DISPOSABLE) ×1 IMPLANT
GOWN STRL REUS W/TWL LRG LVL3 (GOWN DISPOSABLE) ×1
GOWN STRL REUS W/TWL XL LVL3 (GOWN DISPOSABLE) ×1
HEAD HUMERAL ECLIPSE 39/16 (Shoulder) ×2 IMPLANT
ILLUMINATOR WAVEGUIDE N/F (MISCELLANEOUS) IMPLANT
IV NS 100ML SINGLE PACK (IV SOLUTION) ×2 IMPLANT
IV NS IRRIG 3000ML ARTHROMATIC (IV SOLUTION) ×2 IMPLANT
KIT SET UNIVERSAL (KITS) ×2 IMPLANT
KIT STABILIZATION SHOULDER (MISCELLANEOUS) ×2 IMPLANT
MANIFOLD NEPTUNE II (INSTRUMENTS) ×2 IMPLANT
MASK FACE SPIDER DISP (MASK) ×2 IMPLANT
MAT ABSORB  FLUID 56X50 GRAY (MISCELLANEOUS) ×1
MAT ABSORB FLUID 56X50 GRAY (MISCELLANEOUS) ×1 IMPLANT
NDL SAFETY ECLIPSE 18X1.5 (NEEDLE) ×1 IMPLANT
NEEDLE HYPO 18GX1.5 SHARP (NEEDLE) ×1
NEEDLE SPNL 20GX3.5 QUINCKE YW (NEEDLE) ×2 IMPLANT
NS IRRIG 500ML POUR BTL (IV SOLUTION) ×2 IMPLANT
PACK ARTHROSCOPY SHOULDER (MISCELLANEOUS) ×2 IMPLANT
PAD WRAPON POLAR SHDR UNIV (MISCELLANEOUS) ×1 IMPLANT
PENCIL SMOKE EVACUATOR (MISCELLANEOUS) ×2 IMPLANT
PIN NITINOL TARGETER 2.8 (PIN) ×2 IMPLANT
PULSAVAC PLUS IRRIG FAN TIP (DISPOSABLE) ×2
SCREW SM FOR ECLIPSE CAGE 30 (Screw) ×2 IMPLANT
SIZER ECLIPSE CAGE SCREW (ORTHOPEDIC DISPOSABLE SUPPLIES) ×2 IMPLANT
SLING ULTRA II M (MISCELLANEOUS) ×2 IMPLANT
SPONGE T-LAP 18X18 ~~LOC~~+RFID (SPONGE) ×4 IMPLANT
STAPLER SKIN PROX 35W (STAPLE) ×2 IMPLANT
STRAP SAFETY 5IN WIDE (MISCELLANEOUS) ×2 IMPLANT
SUT ETHIBOND 0 MO6 C/R (SUTURE) ×2 IMPLANT
SUT FIBERWIRE #2 38 BLUE 1/2 (SUTURE) ×6
SUT VIC AB 0 CT1 36 (SUTURE) ×4 IMPLANT
SUT VIC AB 2-0 CT1 27 (SUTURE) ×2
SUT VIC AB 2-0 CT1 TAPERPNT 27 (SUTURE) ×2 IMPLANT
SUTURE FIBERWR #2 38 BLUE 1/2 (SUTURE) ×3 IMPLANT
SYR 10ML LL (SYRINGE) ×2 IMPLANT
SYR 30ML LL (SYRINGE) ×4 IMPLANT
SYR TOOMEY IRRIG 70ML (MISCELLANEOUS) ×2
SYRINGE TOOMEY IRRIG 70ML (MISCELLANEOUS) ×1 IMPLANT
TAPE TRANSPORE STRL 2 31045 (GAUZE/BANDAGES/DRESSINGS) ×2 IMPLANT
TIP FAN IRRIG PULSAVAC PLUS (DISPOSABLE) ×1 IMPLANT
WATER STERILE IRR 500ML POUR (IV SOLUTION) ×2 IMPLANT
WRAPON POLAR PAD SHDR UNIV (MISCELLANEOUS) ×2

## 2021-09-05 NOTE — Anesthesia Postprocedure Evaluation (Signed)
Anesthesia Post Note  Patient: Lindsay Glenn  Procedure(s) Performed: TOTAL SHOULDER ARTHROPLASTY (Right: Shoulder)  Patient location during evaluation: PACU Anesthesia Type: Regional and General Level of consciousness: awake and alert, awake and oriented Pain management: pain level controlled Vital Signs Assessment: post-procedure vital signs reviewed and stable Respiratory status: spontaneous breathing, nonlabored ventilation and respiratory function stable Cardiovascular status: blood pressure returned to baseline and stable Postop Assessment: no apparent nausea or vomiting Anesthetic complications: no   No notable events documented.   Last Vitals:  Vitals:   09/05/21 1145 09/05/21 1200  BP: (!) 101/49 (!) 115/52  Pulse: 65 66  Resp: 19 15  Temp: (!) 36.4 C (!) 36.3 C  SpO2: 92% 92%    Last Pain:  Vitals:   09/05/21 1337  TempSrc:   PainSc: 0-No pain                 Phill Mutter

## 2021-09-05 NOTE — H&P (Signed)
History of Present Illness: Lindsay Glenn is a 75 y.o. who presents today for history and physical. She is to undergo a right total shoulder arthroplasty on 09/05/2021. Last seen in the clinic on 08/21/2021 for a history and physical.   The patient has continued to have difficulty with activities of daily living and using her right shoulder despite cortisone injections. She has done better with her hemoglobin A1c on her last visit and her level was at 8.4. In fact, the patient has been scheduled to undergo a right total shoulder arthroplasty in November, 2021. However, this was canceled as she was noted to have an elevated hemoglobin A1c. The patient notes that she has been working with her endocrinologist to bring her blood glucose levels back under control and to reduce her hemoglobin A1c. However, over the past 6 months ago so, she has noted increased pain in both shoulders. She denies any reinjury to either shoulder and denies any numbness or paresthesias down her arms to her hands. She rates her pain on today's visit of 5/10. 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 Her symptoms are worse with activities at or above shoulder level, as well as when reaching behind her back. She also has pain at night.  Past Medical History:  Acid reflux   Anesthesia complication (aspirated after colonoscopy, 10-12 years ago)   Arthritis   Bunion of left foot 05/03/2020   Bunion of right foot 05/03/2020   Carpal tunnel syndrome, right 03/19/2020   CHF (congestive heart failure) (CMS-HCC) 11/2019   Chronic heart failure with preserved ejection fraction (HFpEF) (CMS-HCC) 01/20/2020   Chronic kidney disease   Depression   Diabetes mellitus type I (CMS-HCC) LADA 05-16-2020   Dyspnea 09/23/2018   Dyspnea on exertion 01/20/2020   Edema   Essential hypertension 01/20/2020   Gallbladder polyp 12/18/2018   Gastroparesis 12/18/2018   GERD (gastroesophageal reflux disease) 2000   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   Hammer toe of left foot 05/03/2020   Hammer toe of right foot 05/03/2020   Hashimoto's thyroiditis 11/17/2008   History of breast cancer 03/20/2020   History of cancer 01/1999 (breast cancer)   History of carpal tunnel release 03/16/2020   History of cataract   History of CVA (cerebrovascular accident) 04/17/2020   History of stroke 05/2016   Hyperlipidemia 2010   Hypertension   Hypotension   Insulin pump in place (BILATERAL)   Iron deficiency 09/18/2020   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 (CMS-HCC) 05/14/2020   Liver disease   Major depressive disorder, recurrent, 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   OSA (obstructive sleep apnea) 05/31/2020   Osteopenia of neck of left femur 04/03/2020   Osteoporosis (now osteopenia)   Pancreatic cyst 01/16/2019   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0-40 with comorbidity) (CMS-HCC) 03/20/2020   Sleep apnea (does not tolerate CPAP)   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/2020   Thyroid disease 2011  Type 2 diabetes mellitus (CMS-HCC)   Past Surgical History:  CARPAL TUNNEL RELEASE 2004   COMBINED AUGMENTATION MAMMAPLASTY AND ABDOMINOPLASTY   EXTRACTION CATARACT EXTRACAPSULAR W/INSERTION INTRAOCULAR PROSTHESIS Left 08/01/2020  Procedure: LEFT ORA, LRI-cataract extraction  with intraocular lens implant; Surgeon: Arline Asp, MD; Location: [facility]; Service: Ophthalmology; Laterality: Left;   EXTRACTION CATARACT EXTRACAPSULAR W/INSERTION INTRAOCULAR PROSTHESIS Right 08/08/2020  Procedure: RIGHT DEXTENZA, ORA, LRI-Cataract extraction with intraocular lens implant and limbal relaxing incisions; Surgeon: Arline Asp, MD; Location: [facility]; Service: Ophthalmology; Laterality: Right; LRI   FRACTURE SURGERY 2004 (Rt. ankle dislocation)   HYSTERECTOMY   HYSTERECTOMY VAGINAL W/COLPECTOMY 1995   JOINT REPLACEMENT 2008, 2019 (lt knee, rt knee)   MASTECTOMY 2000 (left. rt prophylactic)   OOPHORECTOMY 1995   Open right carpal tunnel release Right 04/04/2020 (Dr. Roland Rack)   [facility] [facility]   REPLACEMENT TOTAL KNEE Left 2006   REPLACEMENT TOTAL KNEE Right 2019   SHOULDER ARTHROSCOPY DISTAL CLAVICLE EXCISION AND OPEN ROTATOR CUFF REPAIR Right 2017   TUBAL LIGATION   Past Family History  High blood pressure (Hypertension) Father   Other Father (liposarcoma, originated in pancreas and required complete pancreatectomy)   Cancer Father   Alzheimer's disease Mother   Deep vein thrombosis (DVT or abnormal blood clot formation) Mother   Diabetes Maternal Aunt   Parkinsonism Paternal Aunt   Macular degeneration Neg Hx   Glaucoma Neg Hx   Anesthesia problems Neg Hx   Medications:  amLODIPine (NORVASC) 10 MG tablet Take 1 tablet (10 mg total) by mouth once daily 90 tablet 3   aspirin 81 MG EC tablet Take 81 mg by mouth once daily CVA 2017   b complex vitamins tablet Take 1 tablet by mouth once daily   carBAMazepine (TEGRETOL XR) 100 MG XR tablet Take 1 tablet (100 mg total) by mouth 2 (two) times daily 60 tablet 1   cephalexin (KEFLEX) 500 MG capsule Take 500 mg by mouth as directed (1 hour prior to Dental appt)   cholecalciferol (VITAMIN D3) 2,000 unit capsule Take 2,000 Units by mouth once daily   clotrimazole-betamethasone (LOTRISONE)  1-0.05 % cream clotrimazole-betamethasone 1 %-0.05 % topical cream   escitalopram oxalate (LEXAPRO) 10 MG tablet Take 1 tablet (10 mg total) by mouth once daily for 90 days 30 tablet 2   FREESTYLE LIBRE 14 DAY SENSOR kit for glucose monitoring; USE TWICE A MONTH 2 kit 0   insulin ASPART (NOVOLOG U-100 INSULIN ASPART) injection (concentration 100 units/mL) Infuse via insulin pump as directed. Max daily dose 70 units. Please dispense brand name Novolog. 60 mL 3   insulin DEGLUDEC (TRESIBA FLEXTOUCH U-100) pen injector (concentration 100 units/mL) Inject 36 units daily in the event of insulin pump failure. Restart pump 24 hours after [place] given 15 mL 1   insulin syringe-needle U-100 0.5 mL 31 gauge x 5/16" syringe Use as directed 100 each 1   loratadine (CLARITIN) 10 mg tablet Take 10 mg by mouth as needed   losartan (COZAAR) 50 MG tablet Take 1 tablet (50 mg total) by mouth nightly for 180 days 90 tablet 1   metoprolol succinate (TOPROL-XL) 25 MG XL tablet Take 0.5 tablets (12.5 mg total) by mouth once daily 15 tablet 11   multivitamin with minerals tablet Take 1 tablet by mouth once daily   omeprazole (PRILOSEC) 20 MG DR capsule Take 20 mg by mouth once daily   OMNIPOD DASH 5 PACK POD Crtg USE 1 NEW POD EVERY  2 TO 3 DAYS 45 each 3   rOPINIRole (REQUIP) 2 MG tablet Take 1 tablet (2 mg total) by mouth at bedtime 90 tablet 1   tiZANidine (ZANAFLEX) 2 MG capsule Take 1 capsule (2 mg total) by mouth at bedtime as needed for Muscle spasms 30 capsule 1   TORsemide (DEMADEX) 5 MG tablet Take 1 tablet (5 mg total) by mouth once daily Take 5 mg with your 10 mg tablet for a total of 15 mg daily. (Patient taking differently: Take 5 mg by mouth once daily Take 5 mg with your 10 mg tablet for a total of 15 mg daily) 30 tablet 11   pravastatin (PRAVACHOL) 80 MG tablet Take 1 tablet (80 mg total) by mouth once daily 90 tablet 3   TORsemide (DEMADEX) 10 MG tablet Take 1 tablet (10 mg total) by mouth once daily for  180 days 90 tablet 1   Allergies:   Trulicity [Dulaglutide] Diarrhea   Adhesive Tape-Silicones - Causes rash, blistering if on for several days (Paper tape and tegaderm OK)   Atorvastatin Other (See Comments)   Jardiance [Empagliflozin] Other (Decreased energy)   Lisinopril Cough   Furosemide Rash   Rosuvastatin Unknown   Review of Systems A comprehensive 14 point ROS was performed, reviewed, and the pertinent orthopaedic findings are documented in the HPI.  Physical Exam: BP (!) 167/68 (BP Location: Right upper arm, Patient Position: Sitting, BP Cuff Size: Adult)  Pulse 76  Ht 152.4 cm (5')  Wt 85.7 kg (189 lb)  BMI 36.91 kg/m   General: Well-developed well-nourished female seen in no acute distress.   HEENT: Atraumatic,normocephalic. Pupils are equal and reactive to light. Oropharynx is clear with moist mucosa  Lungs: Clear to auscultation bilaterally   Cardiovascular: Regular rate and rhythm. Normal S1, S2. Is noted to have a significant 2/6 systolic murmur. No appreciable gallops or rubs. Peripheral pulses are palpable.  Abdomen: Soft, non-tender, nondistended. Bowel sounds present  Right shoulder exam: SKIN: Normal SWELLING: None WARMTH: None LYMPH NODES: No adenopathy palpable CREPITUS: Mild intermittent glenohumeral crepitance TENDERNESS: Mildly tender over anterior shoulder ROM (active):      Forward flexion: 120 degrees    Abduction: 110 degrees    Internal rotation: Right buttock ROM (passive):      Forward flexion: 135 degrees    Abduction: 130 degrees       ER/IR at 90 abd: 70 degrees/50 degrees   She describes mild-moderate pain with forward flexion, abduction, and internal rotation.   STRENGTH:   Forward flexion: 4-4+/5                         Abduction: 4/5                         External rotation: 4-4+/5                         Internal rotation: 4+/5                         Pain with RC testing: None   STABILITY: Normal   SPECIAL TESTS:        Luan Pulling' test: Mildly positive  Speed's test: Negative                                     Capsulitis - pain w/ passive ER: No                                     Crossed arm test: No                                     Crank: Not evaluated                                     Anterior apprehension: Negative                                     Posterior apprehension: Not evaluated  Neurological: The patient is alert and oriented Sensation to light touch appears to be intact and within normal limits Gross motor strength appeared to be equal to 5/5  Vascular : Peripheral pulses felt to be palpable. Capillary refill appears to be intact and within normal limits  X-ray: X-rays of the right shoulder taken on 08/21/2021 showed severe degenerative changes to the glenohumeral articulation with bone-on-bone pathology being noted. The humerus was noted to be riding superiorly. There was some degenerative changes noted to the [facility] joint as well.  Impression: 1. Degenerative arthrosis right shoulder  Plan:  The treatment options, including both surgical and nonsurgical choices, have been discussed in detail with the patient. She would like to proceed with surgical intervention, specifically a right total shoulder arthroplasty. The risks (including bleeding, infection, nerve and/or blood vessel injury, persistent or recurrent pain, loosening or failure of the components, dislocation, need for further surgery, blood clots, strokes, heart attacks or arrhythmias, pneumonia, etc.) and benefits of the surgical procedure were discussed. The patient states her understanding and agrees to proceed. A formal written consent will be obtained by the nursing staff.   H&P reviewed and patient re-examined. No changes.

## 2021-09-05 NOTE — Evaluation (Signed)
Physical Therapy Evaluation Patient Details Name: Lindsay Glenn MRN: [id number] DOB: [birth year] Today's Date: 09/05/2021  History of Present Illness  Lindsay Glenn is a 75 y.o. who is s/p right total shoulder arthroplasty on 09/05/2021.  Clinical Impression  Patient received in bed, husband present in room. She reports she is feeling pretty good. She is mod independent with bed mobility and min assist for sit to stand from bed. Patient ambulated in room with min guard, holding IV pole. She ambulated on RA and did desaturate into 80%s therefore returned [place] to patient once seated in recliner. She will continue to benefit from skilled PT while here to improve mobility for return home.        Recommendations for follow up therapy are one component of a multi-disciplinary discharge planning process, led by the attending physician.  Recommendations may be updated based on patient status, additional functional criteria and insurance authorization.  Follow Up Recommendations Home health PT    Equipment Recommendations  None recommended by PT    Recommendations for Other Services       Precautions / Restrictions Precautions Precautions: Fall;Shoulder Shoulder Interventions: Shoulder sling/immobilizer Restrictions Weight Bearing Restrictions: Yes RUE Weight Bearing: Non weight bearing      Mobility  Bed Mobility Overal bed mobility: Modified Independent             General bed mobility comments: use of bed rail and increased effort    Transfers Overall transfer level: Needs assistance Equipment used: None Transfers: Sit to/from Stand Sit to Stand: Min assist         General transfer comment: min assist to power up from sitting to stand at edge of bed  Ambulation/Gait Ambulation/Gait assistance: Min guard Gait Distance (Feet): 25 Feet Assistive device: IV Pole Gait Pattern/deviations: Step-through pattern;Decreased step length - right;Decreased step length - left;Decreased  stride length Gait velocity: decr   General Gait Details: patient holding lightly to IV pole for ambulation in room. O2 sats dropping into 80s on room air. Returned patient to [place] upon returning to Physicist, medical    Modified Rankin (Stroke Patients Only)       Balance Overall balance assessment: Mild deficits observed, not formally tested   Sitting balance-Leahy Scale: Good     Standing balance support: Single extremity supported;During functional activity Standing balance-Leahy Scale: Fair Standing balance comment: min guard for ambulation in room holding to IV pole                             Pertinent Vitals/Pain Pain Assessment: Faces Faces Pain Scale: Hurts a little bit Pain Location: R shoulder Pain Descriptors / Indicators: Discomfort Pain Intervention(s): Monitored during session    Home Living Family/patient expects to be discharged to:: Private residence Living Arrangements: Spouse/significant other Available Help at Discharge: Family;Available 24 hours/day Type of Home: House Home Access: Stairs to enter Entrance Stairs-Rails: None Entrance Stairs-Number of Steps: 2 Home Layout: One level Home Equipment: None      Prior Function Level of Independence: Independent               Hand Dominance   Dominant Hand: Right    Extremity/Trunk Assessment   Upper Extremity Assessment Upper Extremity Assessment: RUE deficits/detail RUE: Unable to fully assess due to immobilization    Lower Extremity Assessment Lower Extremity Assessment: Overall WFL for tasks assessed  Cervical / Trunk Assessment Cervical / Trunk Assessment: Normal  Communication   Communication: No difficulties  Cognition Arousal/Alertness: Awake/alert Behavior During Therapy: WFL for tasks assessed/performed Overall Cognitive Status: Within Functional Limits for tasks assessed                                         General Comments      Exercises     Assessment/Plan    PT Assessment Patient needs continued PT services  PT Problem List Decreased strength;Decreased activity tolerance;Decreased balance;Decreased mobility;Decreased safety awareness;Pain;Cardiopulmonary status limiting activity;Decreased range of motion       PT Treatment Interventions Therapeutic activities;Gait training;Stair training;Functional mobility training;Therapeutic exercise;Balance training;Patient/family education    PT Goals (Current goals can be found in the Care Plan section)  Acute Rehab PT Goals Patient Stated Goal: to return home PT Goal Formulation: With patient/family Time For Goal Achievement: 09/06/21 Potential to Achieve Goals: Good    Frequency 7X/week   Barriers to discharge        Co-evaluation               AM-PAC PT "6 Clicks" Mobility  Outcome Measure Help needed turning from your back to your side while in a flat bed without using bedrails?: A Lot Help needed moving from lying on your back to sitting on the side of a flat bed without using bedrails?: A Lot Help needed moving to and from a bed to a chair (including a wheelchair)?: A Little Help needed standing up from a chair using your arms (e.g., wheelchair or bedside chair)?: A Little Help needed to walk in hospital room?: A Little Help needed climbing 3-5 steps with a railing? : A Little 6 Click Score: 16    End of Session Equipment Utilized During Treatment: Oxygen Activity Tolerance: Patient tolerated treatment well Patient left: in chair;with call bell/phone within reach;with chair alarm set;with family/visitor present Nurse Communication: Mobility status PT Visit Diagnosis: Unsteadiness on feet (R26.81);Other abnormalities of gait and mobility (R26.89);Pain Pain - Right/Left: Right Pain - part of body: Shoulder    Time: 1459-1516 PT Time Calculation (min) (ACUTE ONLY): 17 min   Charges:   PT Evaluation $PT  Eval Moderate Complexity: 1 Mod          Sheldon Sem, PT, GCS 09/05/21,3:39 PM

## 2021-09-05 NOTE — Evaluation (Signed)
Occupational Therapy Evaluation Patient Details Name: Lindsay Glenn MRN: [id number] DOB: August 21, 1946 Today's Date: 09/05/2021   History of Present Illness Lindsay Glenn is a 75 y.o. who is s/p right total shoulder arthroplasty on 09/05/2021.   Clinical Impression   Patient was seen for an OT evaluation this date. Pt lives with her spouse in a 1 story home. Prior to surgery, pt was active and independent. Pt has orders for RUE to be immobilized and will be NWBing per MD. Patient presents with impaired strength/ROM, pain, and sensation to RUE but improving per pt. These impairments result in a decreased ability to perform self care tasks requiring MIN-MOD assist for UB/LB dressing and bathing and MAX assist for application of polar care, compression stockings, and sling/immobilizer. Pt and spouse instructed in falls prevention, home/routines modifications, polar care mgt, shoulder sling/immobilizer mgt, RUE NWBing and precautions, hemi techniques and other adaptive strategies for bathing/dressing/toileting/grooming, positioning and considerations for sleep. Handout provided. Visual demo and verbal instruction for shoulder sling and polar care. OT adjusted sling/immobilizer and polar care to improve comfort, optimize positioning, and to maximize skin integrity/safety. Pt/spouse verbalized understanding of all education/training provided. Pt will benefit from skilled OT services to address these limitations and improve independence in daily tasks. Recommend HOT services to continue therapy to maximize return to PLOF, address home/routines modifications and safety, minimize falls risk, and minimize caregiver burden.      Recommendations for follow up therapy are one component of a multi-disciplinary discharge planning process, led by the attending physician.  Recommendations may be updated based on patient status, additional functional criteria and insurance authorization.   Follow Up Recommendations  Home  health OT    Equipment Recommendations  3 in 1 bedside commode;Other (comment) (reacher, LH sponge, toileting aide)    Recommendations for Other Services       Precautions / Restrictions Precautions Precautions: Fall;Shoulder Shoulder Interventions: Shoulder sling/immobilizer;At all times;Off for dressing/bathing/exercises;Shoulder abduction pillow Precaution Booklet Issued: Yes (comment) Restrictions Weight Bearing Restrictions: Yes RUE Weight Bearing: Non weight bearing      Mobility Bed Mobility              General bed mobility comments: NT, up in the recliner    Transfers     Balance Overall balance assessment: Mild deficits observed, not formally tested   Sitting balance-Leahy Scale: Good                                ADL either performed or assessed with clinical judgement   ADL Overall ADL's : Needs assistance/impaired                                       General ADL Comments: Pt currently requires MIN A for LB ADL, MIN-MOD A for UB ADL. Spouse able to assist.     Vision         Perception     Praxis      Pertinent Vitals/Pain Pain Assessment: No/denies pain Faces Pain Scale: Hurts a little bit Pain Location: R shoulder Pain Descriptors / Indicators: Discomfort Pain Intervention(s): Monitored during session     Hand Dominance Right   Extremity/Trunk Assessment Upper Extremity Assessment Upper Extremity Assessment: RUE deficits/detail RUE: Unable to fully assess due to immobilization RUE Sensation: decreased light touch;decreased proprioception RUE Coordination: decreased fine motor;decreased gross motor  Lower Extremity Assessment Lower Extremity Assessment: Overall WFL for tasks assessed   Cervical / Trunk Assessment Cervical / Trunk Assessment: Normal   Communication Communication Communication: No difficulties   Cognition Arousal/Alertness: Awake/alert Behavior During Therapy: WFL for tasks  assessed/performed Overall Cognitive Status: Within Functional Limits for tasks assessed                                     General Comments       Exercises Other Exercises Other Exercises: Pt and spouse instructed in falls prevention, home/routines modifications, polar care mgt, shoulder sling/immobilizer mgt, RUE NWBing and precautions, hemi techniques for ADL, AE for toileting and bathing. Handout provided. Visual demo and verbal instruction for shoulder sling and polar care. Both removed and placed back in position with adequate skin coverage for the polar care.   Shoulder Instructions      Home Living Family/patient expects to be discharged to:: Private residence Living Arrangements: Spouse/significant other Available Help at Discharge: Family;Available 24 hours/day Type of Home: House Home Access: Stairs to enter [employer] of Steps: 2 Entrance Stairs-Rails: None Home Layout: One level     Bathroom Shower/Tub: Occupational psychologist: Standard     Home Equipment: None;Shower seat - built in;Hand held shower head;Grab bars - tub/shower          Prior Functioning/Environment Level of Independence: Independent                 OT Problem List: Decreased range of motion;Decreased strength;Impaired sensation;Decreased knowledge of use of DME or AE;Decreased knowledge of precautions;Impaired UE functional use      OT Treatment/Interventions: Self-care/ADL training;Therapeutic exercise;Therapeutic activities;DME and/or AE instruction;Patient/family education;Balance training    OT Goals(Current goals can be found in the care plan section) Acute Rehab OT Goals Patient Stated Goal: to return home OT Goal Formulation: With patient/family Time For Goal Achievement: 09/19/21 Potential to Achieve Goals: Good ADL Goals Pt Will Perform Upper Body Dressing: sitting;with caregiver independent in assisting;with modified independence Pt  Will Perform Lower Body Dressing: sit to/from stand;with caregiver independent in assisting;with modified independence Pt Will Transfer to Toilet: with modified independence;ambulating (LRAD PRN) Additional ADL Goal #1: Pt will independently instruct family in polar care mgt Additional ADL Goal #2: Pt will independently instruct family in compression stocking mgt  OT Frequency: Min 2X/week   Barriers to D/C:            Co-evaluation              AM-PAC OT "6 Clicks" Daily Activity     Outcome Measure Help from another person eating meals?: None Help from another person taking care of personal grooming?: A Little Help from another person toileting, which includes using toliet, bedpan, or urinal?: A Little Help from another person bathing (including washing, rinsing, drying)?: A Lot Help from another person to put on and taking off regular upper body clothing?: A Lot Help from another person to put on and taking off regular lower body clothing?: A Little 6 Click Score: 17   End of Session    Activity Tolerance: Patient tolerated treatment well Patient left: in chair;with call bell/phone within reach;with family/visitor present;with nursing/sitter in room;Other (comment) (polar care and shoulder sling/immobilizer in place)  OT Visit Diagnosis: Other abnormalities of gait and mobility (R26.89)                Time:  6720-9198 OT Time Calculation (min): 54 min Charges:  OT General Charges $OT Visit: 1 Visit OT Evaluation $OT Eval Moderate Complexity: 1 Mod OT Treatments $Self Care/Home Management : 38-52 mins  Ardeth Perfect., MPH, MS, OTR/L ascom [phone] 09/05/21, 5:14 PM

## 2021-09-05 NOTE — Anesthesia Procedure Notes (Addendum)
Procedure Name: Intubation Date/Time: 09/05/2021 7:37 AM Performed by: Kelton Pillar, CRNA Pre-anesthesia Checklist: Patient identified, Emergency Drugs available, Suction available and Patient being monitored Patient Re-evaluated:Patient Re-evaluated prior to induction Oxygen Delivery Method: Circle system utilized Preoxygenation: Pre-oxygenation with 100% oxygen Induction Type: IV induction Ventilation: Mask ventilation without difficulty Laryngoscope Size: McGraph and 3 Grade View: Grade I Tube type: Oral Tube size: 6.5 mm Number of attempts: 1 Airway Equipment and Method: Stylet and Oral airway Placement Confirmation: ETT inserted through vocal cords under direct vision, positive ETCO2, breath sounds checked- equal and bilateral and CO2 detector Secured at: 21 cm Tube secured with: Tape Dental Injury: Teeth and Oropharynx as per pre-operative assessment

## 2021-09-05 NOTE — Anesthesia Procedure Notes (Signed)
Anesthesia Regional Block: Interscalene brachial plexus block   Pre-Anesthetic Checklist: , timeout performed,  Correct Patient, Correct Site, Correct Laterality,  Correct Procedure, Correct Position, site marked,  Risks and benefits discussed,  Surgical consent,  Pre-op evaluation,  At surgeon's request and post-op pain management  Laterality: Right  Prep: chloraprep       Needles:  Injection technique: Single-shot  Needle Type: Stimiplex     Needle Length: 10cm  Needle Gauge: 21     Additional Needles:   Procedures:,,,, ultrasound used (permanent image in chart),,    Narrative:  Start time: 09/05/2021 7:22 AM End time: 09/05/2021 7:26 AM Injection made incrementally with aspirations every 5 mL.  Performed by: Personally  Anesthesiologist: Darrin Nipper, MD  Additional Notes: Functioning IV was confirmed and monitors applied. Ultrasound guidance: relevant anatomy identified, needle position confirmed, local anesthetic spread visualized around nerve(s)., vascular puncture avoided.  Image printed for medical record.  Negative aspiration and no paresthesias; incremental administration of local anesthetic. The patient tolerated the procedure well. Vital signs recorded in RN notes.

## 2021-09-05 NOTE — Anesthesia Preprocedure Evaluation (Addendum)
Anesthesia Evaluation  Patient identified by MRN, date of birth, ID band Patient awake    Reviewed: Allergy & Precautions, NPO status , Patient's Chart, lab work & pertinent test results  History of Anesthesia Complications (+) PONV and history of anesthetic complications (hx aspiration with colonoscopy)  Airway Mallampati: IV   Neck ROM: Full    Dental no notable dental hx.    Pulmonary sleep apnea ,    Pulmonary exam normal breath sounds clear to auscultation       Cardiovascular hypertension, + Valvular Problems/Murmurs (mild) AS  Rhythm:Regular Rate:Normal + Systolic murmurs HFpEF  ECG 08/23/21: Rate: 76 bpm Rhythm: normal sinus Axis (leads I and aVF): Normal Intervals: PR 186 ms. QRS 96 ms. QTc 456 ms. ST segment and T wave changes: No evidence of acute ST segment elevation or depression Comparison: Similar to previous tracing obtained on 03/28/2020 NOTE: Tracing obtained at [facility]; unable for review. Above based on cardiologist's interpretation.   TTE 08/26/21: 1. LVEF >55% 2. Normal left ventricular systolic function with moderate LVH 3. Normal right ventricular systolic function 4. Mild MR, TR, PR 5. No AR 6. Mild aortic stenosis with a MPG of 21.0 mmHg; AVA (VTI) = 1.7 cm 7. Mild biatrial enlargement 8. No evidence of pericardial effusion   Neuro/Psych CVA (2017, affected eye), No Residual Symptoms    GI/Hepatic GERD  ,  Endo/Other  diabetes, Type 2Obesity   Renal/GU Renal disease (stage III CKD)     Musculoskeletal  (+) Arthritis ,   Abdominal   Peds  Hematology negative hematology ROS (+)   Anesthesia Other Findings Reviewed cardiology note 08/23/21  Reproductive/Obstetrics                            Anesthesia Physical Anesthesia Plan  ASA: 3  Anesthesia Plan: General and Regional   Post-op Pain Management:  Regional for Post-op pain and GA combined w/  Regional for post-op pain   Induction: Intravenous  PONV Risk Score and Plan: 4 or greater and Treatment may vary due to age or medical condition and Ondansetron  Airway Management Planned: Natural Airway and LMA  Additional Equipment:   Intra-op Plan:   Post-operative Plan: Extubation in OR  Informed Consent: I have reviewed the patients History and Physical, chart, labs and discussed the procedure including the risks, benefits and alternatives for the proposed anesthesia with the patient or authorized representative who has indicated his/her understanding and acceptance.     Dental advisory given  Plan Discussed with: CRNA  Anesthesia Plan Comments: (Plan for preoperative interscalene nerve block and GETA.)       Anesthesia Quick Evaluation

## 2021-09-05 NOTE — Op Note (Signed)
09/05/2021  11:23 AM  Patient:   Lindsay Glenn  Pre-Op Diagnosis:   Advanced primary degenerative joint disease, right shoulder.  Post-Op Diagnosis:   Same.  Procedure:   Right total shoulder arthroplasty.  Surgeon:   Pascal Lux, MD  Assistant:   Cameron Proud, PA-C  Anesthesia:   GET interscalene block using Exparel placed preoperative by the anesthesiologist.  Findings:   As above. The rotator cuff was in satisfactory condition.  Complications:   None  EBL:   75 cc  Fluids:   950 cc crystalloid  UOP:   None  TT:   None  Drains:   None  Closure:   Staples  Implants:   Arthrex system with a 39 mm humeral platform with a small central capscrew and a 16 x 39 mm humeral head, and a small cemented glenoid component.  Brief Clinical Note:   The patient is a 75 year old female with a long history of gradually worsening right shoulder pain.  Her symptoms have progressed despite medications, activity modification, etc.  Her history and examination are consistent with advanced generative joint disease.  Preoperative MRI scanning showed the rotator cuff to be in good condition.  The patient presents at this time for a right total shoulder arthroplasty.  Procedure:   The patient underwent placement of an interscalene block using Exparel in the preoperative holding area by the anesthesiologist before being brought into the operating room and lain in the supine position. After satisfactory general endotracheal intubation and anesthesia was achieved, the patient was repositioned in the beach chair position using the beach chair positioner. The right shoulder and upper extremity were prepped with ChloraPrep solution before being draped sterilely. Preoperative antibiotics were administered.   A standard anterior approach to the shoulder was made through an approximately 4-5 inch incision. The incision was carried down through the subcutaneous tissues to expose the deltopectoral fascia.  The interval between the deltoid and pectoralis muscles was identified and this plane developed, retracting the cephalic vein laterally with the deltoid muscle. The conjoined tendon was identified. The lateral margin was dissected and the Kolbel self-retraining retractor inserted. The "three sisters" were identified and cauterized. Bursal tissues were removed to improve visualization.  The biceps tendon was identified in the bicipital groove, and a soft tissue biceps tenodesis performed using two #0 Ethibond interrupted sutures, tacking the distal portion of the biceps tendon to the adjacent pectoralis major tendon tissues.  The subscapularis tendon was released from its attachment to the lesser tuberosity 1 cm proximal to its insertion and several tagging sutures placed. The inferior capsule was released with care after identifying and protecting the axillary nerve.   The humeral head was dislocated and, using the appropriate guide, the humeral head was resected.  This piece was taken to the back table where it was found to be optimally replicated by a 16 x 39 mm humeral head.  Attention was directed to the glenoid. The labrum was debrided circumferentially before the center of the glenoid was marked with electrocautery. The small and medium sizers were positioned and it was elected to proceed with a small glenoid component. The Hyperflex guidewire was drilled into the glenoid neck using the appropriate guide. After verifying its position, the glenoid was lightly reamed with the glenoid reamer before the central post reamer was used. The peripheral peg guide was positioned, and the superior peg and inferior keel were drilled out.  The keel region was further prepared using the appropriate rasps.  The bony surfaces were prepared for cementing by irrigating them thoroughly with bacitracin saline solution using the jet lavage system, then packing the glenoid with a Neo-Synephrine soaked sponge.  In addition,  some of the bone was harvested from the humeral head and packed around the central post of the glenoid with the appropriate pliers.  Meanwhile, cement was mixed on the back table.  When it was ready, some cement was injected into the superior hole and inferior keel slot using a Toomey syringe. The component was impacted into place and the excess cement was removed using a Surveyor, quantity.  Pressure was maintained on the glenoid until the cement hardened.  Attention was re-directed to the humeral side. The proximal humeral surface was sized and the 39 mm diameter platform selected.  The central post was prepared using the appropriate devices before the 39 mm platform and the small central capscrew were positioned.  The trial 16 x 39 mm component was trialed and found to fit well.  After reducing the shoulder, the arm demonstrated excellent range of motion as the hand could be brought across the chest to the opposite shoulder and brought to the top of the patient's head and to the patient's ear. The shoulder remained stable throughout this range of motion, and was stable with abduction and external rotation. The joint was dislocated and the trial component was removed. The permanent 16 x 39 mm humeral head was impacted into place. Again, the [facility] taper locking mechanism was verified using manual distraction. The shoulder was relocated and again placed through a range of motion with the findings as described above.  The wound was copiously irrigated with sterile saline solution using the jet lavage system before a total of 10 cc of Exparel diluted out to 30 cc with 20 cc of 0.5% Sensorcaine with epinephrine was injected into the pericapsular and peri-incisional tissues to help with postoperative analgesia.  The subscapularis tendon was reapproximated using #2 FiberWire interrupted sutures.  A single 2.9 mm Biomet JuggerKnot anchor was placed near the inferior aspect of the lesser tuberosity to better secure the  inferior portion of the subscapularis tendon.  The deltopectoral interval was closed using #0 Vicryl interrupted sutures before the subcutaneous tissues were closed using 2-0 Vicryl interrupted sutures.  The skin was closed using staples.  Prior to closing the skin, 1 g of transexemic acid in 10 cc of normal saline was injected intra-articularly to help with postoperative bleeding.  A sterile occlusive dressing was applied to the wound before the arm was placed into a shoulder immobilizer with an abduction pillow.  A polar care device also was applied to the shoulder.  The patient was then transferred back to a hospital bed before being awakened, extubated, and returned to the recovery room in satisfactory condition after tolerating the procedure well.

## 2021-09-05 NOTE — Progress Notes (Signed)
Inpatient Diabetes Program Recommendations  AACE/ADA: New Consensus Statement on Inpatient Glycemic Control (2015)  Target Ranges:  Prepandial:   less than 140 mg/dL      Peak postprandial:   less than 180 mg/dL (1-2 hours)      Critically ill patients:  140 - 180 mg/dL   Lab Results  Component Value Date   GLUCAP 236 (H) 09/05/2021   HGBA1C 10.1 (H) 09/27/2020    Review of Glycemic Control Results for EMMAKATE, HYPES (MRN [id number]) as of 09/05/2021 12:23  Ref. Range 09/05/2021 06:59 09/05/2021 10:38  Glucose-Capillary Latest Ref Range: 70 - 99 mg/dL 149 (H) 236 (H)   Diabetes history: Type 1 DM Outpatient Diabetes medications: Omnipod Current orders for Inpatient glycemic control: none Decadron 5 mg x 1 Follows with Dr Garrel Ridgel- last appointment 9/19  Inpatient Diabetes Program Recommendations:    Patient in PACU and insulin pump applied. Current rate 1.2 units/hr. Per RN patient appropriate and able to independently manage insulin pump. Informed patient that she received steroids so to anticipate the need to correct as blood sugars may be elevated following procedure.  MD at bedside placing insulin pump order set. Patient uses an Omnipod insulin pump with Novolog insulin as an outpatient.   Current insulin pump settings are as follows:  Basal insulin  12A 1.7 units/hour 4 A 1.8 units/hour 12P 1.6 units/hour 6P 1.7 units/hour Total daily basal insulin: 41 units/24 hours  Carb Coverage 0000 1:9 1 unit for every 9 grams of carbohydrates 0630 1:2.3 1 unit for every 2.3 grams of carbohydrates 1700 1:2 1 unit for every 2 grams of carbohydrates 2100 1:9 1 unit for every 9 grams of carbohydrates  Insulin Sensitivity 1:25 1 unit drops blood glucose 25 mg/dl  Target Glucose Goals 12A 150 mg/dl 6P 120 mg/d 9P 150 mg/dl   NURSING: Once insulin pump order set is ordered please print off the Patient insulin pump contract and flow sheet. The insulin pump contract should be signed  by the patient and then placed in the chart. The patient insulin pump flow sheet will be completed by the patient at the bedside and the RN caring for the patient will use the patient's flow sheet to document in the [facility]. RN will need to complete the Nursing Insulin Pump Flowsheet at least once a shift. Patient will need to keep extra insulin pump supplies at the bedside at all times.   Thanks, Bronson Curb, MSN, RNC-OB Diabetes Coordinator [phone] (8a-5p)

## 2021-09-05 NOTE — Transfer of Care (Signed)
Immediate Anesthesia Transfer of Care Note  Patient: Lindsay Glenn  Procedure(s) Performed: TOTAL SHOULDER ARTHROPLASTY (Right: Shoulder)  Patient Location: PACU  Anesthesia Type:General  Level of Consciousness: drowsy and patient cooperative  Airway & Oxygen Therapy: Patient Spontanous Breathing and Patient connected to face mask oxygen  Post-op Assessment: Report given to RN and Post -op Vital signs reviewed and stable  Post vital signs: Reviewed and stable  Last Vitals:  Vitals Value Taken Time  BP 98/49 09/05/21 1036  Temp    Pulse 72 09/05/21 1038  Resp 19 09/05/21 1038  SpO2 99 % 09/05/21 1038  Vitals shown include unvalidated device data.  Last Pain:  Vitals:   09/05/21 0704  TempSrc: Tympanic  PainSc: 4          Complications: No notable events documented.

## 2021-09-06 DIAGNOSIS — G4733 Obstructive sleep apnea (adult) (pediatric): Secondary | ICD-10-CM

## 2021-09-06 DIAGNOSIS — M19011 Primary osteoarthritis, right shoulder: Secondary | ICD-10-CM | POA: Diagnosis not present

## 2021-09-06 LAB — BASIC METABOLIC PANEL
Anion gap: 8 (ref 5–15)
BUN: 47 mg/dL — ABNORMAL HIGH (ref 8–23)
CO2: 25 mmol/L (ref 22–32)
Calcium: 8.7 mg/dL — ABNORMAL LOW (ref 8.9–10.3)
Chloride: 101 mmol/L (ref 98–111)
Creatinine, Ser: 1.91 mg/dL — ABNORMAL HIGH (ref 0.44–1.00)
GFR, Estimated: 27 mL/min — ABNORMAL LOW (ref >60)
Glucose, Bld: 201 mg/dL — ABNORMAL HIGH (ref 70–99)
Potassium: 4 mmol/L (ref 3.5–5.1)
Sodium: 134 mmol/L — ABNORMAL LOW (ref 135–145)

## 2021-09-06 LAB — CBC
HCT: 30.9 % — ABNORMAL LOW (ref 36.0–46.0)
Hemoglobin: 10.2 g/dL — ABNORMAL LOW (ref 12.0–15.0)
MCH: 30.2 pg (ref 26.0–34.0)
MCHC: 33 g/dL (ref 30.0–36.0)
MCV: 91.4 fL (ref 80.0–100.0)
Platelets: 193 10*3/uL (ref 150–400)
RBC: 3.38 MIL/uL — ABNORMAL LOW (ref 3.87–5.11)
RDW: 13.8 % (ref 11.5–15.5)
WBC: 11.5 10*3/uL — ABNORMAL HIGH (ref 4.0–10.5)
nRBC: 0 % (ref 0.0–0.2)

## 2021-09-06 LAB — GLUCOSE, CAPILLARY
Glucose-Capillary: 163 mg/dL — ABNORMAL HIGH (ref 70–99)
Glucose-Capillary: 223 mg/dL — ABNORMAL HIGH (ref 70–99)

## 2021-09-06 MED ORDER — ASPIRIN EC 325 MG PO TBEC: 325.0000 mg | DELAYED_RELEASE_TABLET | Freq: Every day | ORAL | 0 refills | Status: DC

## 2021-09-06 MED ORDER — ACETAMINOPHEN 500 MG PO TABS: 1000.0000 mg | ORAL_TABLET | Freq: Four times a day (QID) | ORAL | 0 refills | Status: DC

## 2021-09-06 MED ORDER — ONDANSETRON HCL 4 MG PO TABS: 4.0000 mg | ORAL_TABLET | Freq: Four times a day (QID) | ORAL | 0 refills | Status: DC | PRN

## 2021-09-06 MED ORDER — OXYCODONE HCL 5 MG PO TABS: 5.0000 mg | ORAL_TABLET | ORAL | 0 refills | Status: DC | PRN

## 2021-09-06 NOTE — Progress Notes (Signed)
SATURATION QUALIFICATIONS: (This note is used to comply with regulatory documentation for home oxygen)  Patient Saturations on Room Air at Rest = 96%  Patient Saturations on Room Air while Ambulating = 91%  Patient Saturations on 0 Liters of oxygen while Ambulating = 90%  Please briefly explain why patient needs home oxygen: Patient did become DOE.

## 2021-09-06 NOTE — Progress Notes (Signed)
D/C paperwork given to patient.  4 hard scripts given to patient. No unanswered questions. IV removed. Tip intact. All belongings with patient. Husband and daughter at bedside. Family transport via private vehicle.

## 2021-09-06 NOTE — Care Management CC44 (Signed)
Condition Code 44 Documentation Completed  Patient Details  Name: Lindsay Glenn MRN: [id number] Date of Birth: 03-07-1946   Condition Code 44 given:  Yes Patient signature on Condition Code 44 notice:  Yes Documentation of 2 MD's agreement:  Yes Code 44 added to claim:  Yes    Conception Oms, RN 09/06/2021, 10:36 AM

## 2021-09-06 NOTE — Progress Notes (Signed)
OT Cancellation Note  Patient Details Name: Lindsay Glenn MRN: [id number] DOB: 03/04/1946   Cancelled Treatment:    Reason Eval/Treat Not Completed: Other (comment). Upon first 2 attempts, pt/spouse still waiting for daughter to arrive for OT session. Pt confirmed later that dtr will be here at 12:30pm. Will plan for OT tx at that time.   Ardeth Perfect., MPH, MS, OTR/L ascom [phone] 09/06/21, 11:39 AM

## 2021-09-06 NOTE — Plan of Care (Signed)

## 2021-09-06 NOTE — Progress Notes (Signed)
Met the patient and her husband in the room at the bedside to discuss DC plan and needs Code 44 completed and the patient was provided a signed copy The patient lives at home with her husband and he provides transportation, she has a cane at home and a raised toilet, she stated that she does not need additional DME She is set up with [facility] She can afford her medications No additional needs

## 2021-09-06 NOTE — Progress Notes (Signed)
Occupational Therapy Treatment Patient Details Name: Lindsay Glenn MRN: [id number] DOB: 1946-03-20 Today's Date: 09/06/2021   History of present illness Lindsay Glenn is a 75 y.o. who is s/p right total shoulder arthroplasty on 09/05/2021.   OT comments  Pt seen for OT tx this date. Spouse and adult daughter present. Pt, spouse, and daughter instructed in falls prevention, home/routines modifications, polar care mgt, shoulder sling/immobilizer mgt (larger sling applied with improved fit and comfort), RUE NWBing and precautions, hemi techniques for ADL, AE for toileting and bathing. Handout reviewed and placed in pt's personal belongings. Visual demo and verbal instruction for shoulder sling and polar care.   Pt required MIN-MOD A for UB dressing, MAX A for polar care and sling doffing/donning, and Min A for LB dressing. Pt noted to be on 3L O2 at start of session. Doffed nasal cannula for LB dressing task. Pt noted with increased WOB and SpO2 with exertion desat to 86% on room air. She was unable to recover with seated rest break and PLB. 2L O2 reapplied and with PLB, improved to 90% on at rest. RN notified. Pt does NOT wear O2 at baseline.   Pt will continue to benefit from skilled OT services to maximize return to PLOF. Continue to recommend [place] at discharge. TOC notified.    Recommendations for follow up therapy are one component of a multi-disciplinary discharge planning process, led by the attending physician.  Recommendations may be updated based on patient status, additional functional criteria and insurance authorization.    Follow Up Recommendations  Home health OT    Equipment Recommendations  3 in 1 bedside commode;Other (comment) (reacher, LH sponge, toileting aide)    Recommendations for Other Services      Precautions / Restrictions Precautions Precautions: Fall;Shoulder Shoulder Interventions: Shoulder sling/immobilizer;At all times;Off for  dressing/bathing/exercises;Shoulder abduction pillow Precaution Booklet Issued: Yes (comment) Restrictions Weight Bearing Restrictions: Yes RUE Weight Bearing: Non weight bearing       Mobility Bed Mobility               General bed mobility comments: NT, up in the recliner    Transfers Overall transfer level: Needs assistance Equipment used: None Transfers: Sit to/from Stand Sit to Stand: Min guard;Supervision              Balance Overall balance assessment: Mild deficits observed, not formally tested                                         ADL either performed or assessed with clinical judgement   ADL Overall ADL's : Needs assistance/impaired                 Upper Body Dressing : Sitting;Minimal assistance;Moderate assistance   Lower Body Dressing: Minimal assistance;Sit to/from stand Lower Body Dressing Details (indicate cue type and reason): Min A for threading over feet                     Vision       Perception     Praxis      Cognition Arousal/Alertness: Awake/alert Behavior During Therapy: WFL for tasks assessed/performed Overall Cognitive Status: Within Functional Limits for tasks assessed  Exercises Other Exercises Other Exercises: Pt, spouse, and daughter instructed in falls prevention, home/routines modifications, polar care mgt, shoulder sling/immobilizer mgt (larger sling applied with improved fit and comfort), RUE NWBing and precautions, hemi techniques for ADL, AE for toileting and bathing. Handout reviewed and placed in pt's personal belongings. Visual demo and verbal instruction for shoulder sling and polar care.   Shoulder Instructions       General Comments Pt noted to be on 3L O2 at start of session. Doffed for LB dressing task. Pt noted with increased WOB, down to 86% on room air. With seated rest and PLB, pt unable to improve on her own,  requiring 2L O2 reapplied, back up to 90% at rest on 2L O2.    Pertinent Vitals/ Pain       Pain Assessment: No/denies pain  Home Living                                          Prior Functioning/Environment              Frequency  Min 2X/week        Progress Toward Goals  OT Goals(current goals can now be found in the care plan section)  Progress towards OT goals: Progressing toward goals  Acute Rehab OT Goals Patient Stated Goal: to return home OT Goal Formulation: With patient/family Time For Goal Achievement: 09/19/21 Potential to Achieve Goals: Good  Plan Discharge plan remains appropriate;Frequency remains appropriate    Co-evaluation                 AM-PAC OT "6 Clicks" Daily Activity     Outcome Measure   Help from another person eating meals?: None Help from another person taking care of personal grooming?: A Little Help from another person toileting, which includes using toliet, bedpan, or urinal?: A Little Help from another person bathing (including washing, rinsing, drying)?: A Lot Help from another person to put on and taking off regular upper body clothing?: A Lot Help from another person to put on and taking off regular lower body clothing?: A Little 6 Click Score: 17    End of Session    OT Visit Diagnosis: Other abnormalities of gait and mobility (R26.89)   Activity Tolerance Patient tolerated treatment well   Patient Left in chair;with call bell/phone within reach;with family/visitor present;with nursing/sitter in room;Other (comment) (polar care and shoulder sling/immobilizer in place)   Nurse Communication Mobility status;Other (comment) (O2 status)        Time: 7858-8502 OT Time Calculation (min): 40 min  Charges: OT General Charges $OT Visit: 1 Visit OT Treatments $Self Care/Home Management : 38-52 mins  Lindsay Glenn., MPH, MS, OTR/L ascom [phone] 09/06/21, 1:27 PM

## 2021-09-06 NOTE — Discharge Summary (Signed)
Physician Discharge Summary  Patient ID: Lindsay Glenn MRN: [id number] DOB/AGE: Oct 18, 1946 75 y.o.  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 b.) TTE 08/26/2021: EF >55%; mild BAE   Aortic stenosis    a.) TTE 02/07/2020: EF 55%; trivial MR; mild AS with MPG 8 mmHg. b.) TTE 08/26/2021: EF >55%; mild MR/TR/PR; mild BAE; mild AS with MPG 21 mmHg.   Arthritis    BILATERAL rotator cuff tendinitis    CKD (chronic kidney disease), stage III (HCC)    Complication of anesthesia    a.) (+) aspiration even during colonoscopy   CTS (carpal tunnel syndrome)    Depression    DOE (dyspnea on exertion)    Dyspnea    Gastroparesis    GERD (gastroesophageal reflux disease)    Hashimoto's thyroiditis    Heart murmur    History of hiatal hernia    HLD (hyperlipidemia)    Hypertension    IDA (iron deficiency anemia)    Insomnia    LADA (latent autoimmune diabetes in adults), managed as type 1 ([place])    a.) uses insulin pump   Obesity    Osteopenia    Osteoporosis    Pancreatic cyst    Pneumonia    PONV (postoperative nausea and vomiting)    Pseudophakia of both eyes    Renal cyst, right    Restless leg syndrome    Sleep apnea    a.) unable to tolerate nocturnal PAP therapy   Stroke ([place]) 2017   field of vision lower on right eye   TIA (transient ischemic attack) 2017     Transfusion: None.   Consultants (if any):   Discharged Condition: Improved  Hospital Course: Lindsay Glenn is an 75 y.o. female who was admitted 09/05/2021 with a diagnosis of advanced  primary degenerative joint disease of the right shoulder and went to the operating room on 09/05/2021 and underwent the above named procedures.    Surgeries: Procedure(s): TOTAL SHOULDER ARTHROPLASTY on 09/05/2021 Patient tolerated the surgery well. Taken to PACU where she was stabilized and then transferred to the orthopedic floor.  Started on Lovenox 40mg  q 24 hrs. Foot pumps applied bilaterally at 80 mm. Heels elevated on bed with rolled towels. No evidence of DVT. Negative [place]. Physical therapy started on day #1 for gait training and transfer. OT started day #1 for ADL and assisted devices.  Patient did require supplemental O2 on POD1, and was weaned off with therapy.  Patient's IV was removed on POD1.  Implants: Arthrex system with a 39 mm humeral platform with a small central capscrew and a 16 x 39 mm humeral head, and a small cemented glenoid component.  She was given perioperative antibiotics:  Anti-infectives (From admission, onward)    Start     Dose/Rate Route Frequency Ordered Stop   09/05/21 1330  ceFAZolin (ANCEF) IVPB 2g/100 mL premix        2 g 200 mL/hr over 30 Minutes Intravenous Every 6 hours 09/05/21 1154 09/06/21 0258   09/05/21 0644  ceFAZolin (ANCEF) 2-4 GM/100ML-% IVPB       Note  to Pharmacy: [facility]   : cabinet override      09/05/21 0644 09/06/21 0258   09/05/21 0600  ceFAZolin (ANCEF) IVPB 2g/100 mL premix        2 g 200 mL/hr over 30 Minutes Intravenous On call to O.R. 09/05/21 1191 09/05/21 0740     .  She was given sequential compression devices, early ambulation, and Lovenox for DVT prophylaxis.  She benefited maximally from the hospital stay and there were no complications.    Recent vital signs:  Vitals:   09/06/21 0515 09/06/21 0739  BP: 121/63 (!) 143/65  Pulse: (!) 57 (!) 56  Resp: 18 15  Temp: 97.6 F (36.4 C) 97.7 F (36.5 C)  SpO2: 97% 100%    Recent laboratory studies:  Lab Results  Component Value Date   HGB 10.2  (L) 09/06/2021   HGB 12.2 08/29/2021   HGB 11.4 (L) 09/27/2020   Lab Results  Component Value Date   WBC 11.5 (H) 09/06/2021   PLT 193 09/06/2021   No results found for: INR Lab Results  Component Value Date   NA 134 (L) 09/06/2021   K 4.0 09/06/2021   CL 101 09/06/2021   CO2 25 09/06/2021   BUN 47 (H) 09/06/2021   CREATININE 1.91 (H) 09/06/2021   GLUCOSE 201 (H) 09/06/2021   Discharge Medications:   Allergies as of 09/06/2021       Reactions   Empagliflozin Nausea And Vomiting, Other (See Comments)   Decreased energy ([place])   Tape Other (See Comments)   Causes rash, blistering( if on for several days)   Paper tape and tegaderm  is OK   Atorvastatin Other (See Comments)   Increases blood sugar   Furosemide Itching, Rash   Lisinopril Cough   Rosuvastatin Other (See Comments)   Increases blood sugar   Trulicity [dulaglutide] Diarrhea, Nausea And Vomiting        Medication List     STOP taking these medications    HYDROcodone-acetaminophen 5-325 MG tablet Commonly known as: Norco       TAKE these medications    acetaminophen 500 MG tablet Commonly known as: TYLENOL Take 2 tablets (1,000 mg total) by mouth every 6 (six) hours.   amLODipine 10 MG tablet Commonly known as: NORVASC Take 10 mg by mouth daily.   aspirin EC 325 MG tablet Take 1 tablet (325 mg total) by mouth daily. What changed:  medication strength how much to take   b complex vitamins tablet Take 1 tablet by mouth daily.   carbamazepine 100 MG 12 hr tablet Commonly known as: TEGRETOL XR Take 100 mg by mouth 2 (two) times daily.   escitalopram 10 MG tablet Commonly known as: LEXAPRO Take 10 mg by mouth daily.   insulin aspart 100 UNIT/ML injection Commonly known as: novoLOG Inject 70-80 Units into the skin daily. Via OmniPod Insulin pump   insulin pump Soln Inject into the skin. Novolog   losartan 50 MG tablet Commonly known as: COZAAR Take 50 mg by mouth at bedtime.    metoprolol succinate 25 MG 24 hr tablet Commonly known as: TOPROL-XL Take 12.5 mg by mouth daily.   multivitamin with minerals tablet Take 1 tablet by mouth daily.   naproxen sodium 220 MG tablet Commonly known as: ALEVE Take 220 mg by mouth as needed.   omeprazole 20 MG capsule Commonly known as: PRILOSEC Take 20 mg by mouth as needed.   ondansetron 4 MG tablet Commonly known as:  ZOFRAN Take 1 tablet (4 mg total) by mouth every 6 (six) hours as needed for nausea.   oxyCODONE 5 MG immediate release tablet Commonly known as: Oxy IR/ROXICODONE Take 1-2 tablets (5-10 mg total) by mouth every 4 (four) hours as needed for moderate pain (pain score 4-6).   pravastatin 80 MG tablet Commonly known as: PRAVACHOL Take 80 mg by mouth at bedtime.   rOPINIRole 2 MG tablet Commonly known as: REQUIP Take 2 mg by mouth at bedtime.   tiZANidine 2 MG tablet Commonly known as: ZANAFLEX Take 2 mg by mouth at bedtime as needed for muscle spasms.   torsemide 10 MG tablet Commonly known as: DEMADEX Take 10 mg by mouth daily.   torsemide 5 MG tablet Commonly known as: DEMADEX Take 5 mg by mouth daily.   Vitamin D 50 MCG (2000 UT) tablet Take 2,000 Units by mouth daily.        Diagnostic Studies: DG Shoulder Right Port  Result Date: 09/05/2021 CLINICAL DATA:  Status post right shoulder arthroplasty EXAM: PORTABLE RIGHT SHOULDER COMPARISON:  06/06/2020 FINDINGS: Interval postsurgical changes from right total shoulder arthroplasty. Arthroplasty components are in their expected alignment. No periprosthetic fracture or evidence of other complication. Expected postoperative changes within the overlying soft tissues. IMPRESSION: Satisfactory postoperative appearance following right shoulder arthroplasty, as above. Electronically Signed   By: Davina Poke D.O.   On: 09/05/2021 14:19   [place] OR NERVE BLOCK-IMAGE ONLY [facility])  Result Date: 09/05/2021 There is no interpretation for this  exam.  This order is for images obtained during a surgical procedure.  Please See "Surgeries" Tab for more information regarding the procedure.    Disposition: Plan will be for discharge home this afternoon after working with therapy today.  Begin taking aspirin 325mg  daily for DVT prevention for two weeks and then transition back to 81mg  daily.   Follow-up Information     Reche Dixon, PA-C Follow up.   Specialty: Orthopedic Surgery Why: Staple removal. Contact information: [address] [place] [address] [phone]                Signed: Judson Roch PA-C 09/06/2021, 9:55 AM

## 2021-09-06 NOTE — Progress Notes (Addendum)
Subjective: 1 Day Post-Op Procedure(s) (LRB): TOTAL SHOULDER ARTHROPLASTY (Right) Patient reports no pain in the right shoulder this AM.  Block still in effect. Patient is well, and has had no acute complaints or problems Plan is to go Home after hospital stay. Negative for chest pain and shortness of breath Fever: no Gastrointestinal:Negative for nausea and vomiting  Objective: Vital signs in last 24 hours: Temp:  [97.5 F (36.4 C)-98.2 F (36.8 C)] 98 F (36.7 C) (10/21 1151) Pulse Rate:  [56-65] 58 (10/21 1151) Resp:  [15-18] 16 (10/21 1151) BP: (121-148)/(59-81) 129/62 (10/21 1151) SpO2:  [90 %-100 %] 90 % (10/21 1317)  Intake/Output from previous day:  Intake/Output Summary (Last 24 hours) at 09/06/2021 1347 Last data filed at 09/06/2021 1026 Gross per 24 hour  Intake 717.82 ml  Output 2 ml  Net 715.82 ml    Intake/Output this shift: Total I/O In: 240 [P.O.:240] Out: 1 [Urine:1]  Labs: Recent Labs    09/06/21 0322  HGB 10.2*   Recent Labs    09/06/21 0322  WBC 11.5*  RBC 3.38*  HCT 30.9*  PLT 193   Recent Labs    09/06/21 0322  NA 134*  K 4.0  CL 101  CO2 25  BUN 47*  CREATININE 1.91*  GLUCOSE 201*  CALCIUM 8.7*   No results for input(s): LABPT, INR in the last 72 hours.   EXAM General - Patient is Alert, Appropriate, and Oriented Extremity - ABD soft Dorsiflexion/Plantar flexion intact Incision: dressing C/D/I No cellulitis present Decreased sensation to light touch to the right shoulder this AM.   Able to flex and extend the wrist this AM. Dressing/Incision - clean, dry, no drainage Motor Function - intact, moving foot and toes well on exam.  Abdomen soft with intact bowel sounds.  Past Medical History:  Diagnosis Date   (HFpEF) heart failure with preserved ejection fraction ([place])    a.) TTE 02/07/2020: EF 55%. b.) TTE 08/26/2021: EF >55%; mild BAE   Aortic stenosis    a.) TTE 02/07/2020: EF 55%; trivial MR; mild AS with MPG 8  mmHg. b.) TTE 08/26/2021: EF >55%; mild MR/TR/PR; mild BAE; mild AS with MPG 21 mmHg.   Arthritis    BILATERAL rotator cuff tendinitis    CKD (chronic kidney disease), stage III (HCC)    Complication of anesthesia    a.) (+) aspiration even during colonoscopy   CTS (carpal tunnel syndrome)    Depression    DOE (dyspnea on exertion)    Dyspnea    Gastroparesis    GERD (gastroesophageal reflux disease)    Hashimoto's thyroiditis    Heart murmur    History of hiatal hernia    HLD (hyperlipidemia)    Hypertension    IDA (iron deficiency anemia)    Insomnia    LADA (latent autoimmune diabetes in adults), managed as type 1 ([place])    a.) uses insulin pump   Obesity    Osteopenia    Osteoporosis    Pancreatic cyst    Pneumonia    PONV (postoperative nausea and vomiting)    Pseudophakia of both eyes    Renal cyst, right    Restless leg syndrome    Sleep apnea    a.) unable to tolerate nocturnal PAP therapy   Stroke ([place]) 2017   field of vision lower on right eye   TIA (transient ischemic attack) 2017    Assessment/Plan: 1 Day Post-Op Procedure(s) (LRB): TOTAL SHOULDER ARTHROPLASTY (Right) Active Problems:  Status post total shoulder arthroplasty, right   Obstructive sleep apnea  Estimated body mass index is 37.67 kg/m as calculated from the following:   Height as of this encounter: 5' (1.524 m).   Weight as of this encounter: 87.5 kg. Advance diet Up with therapy D/C IV fluids when tolerating po intake.  Labs reviewed this AM.   Patient did de-saturate on RA yesterday, currently on supplemental O2.  Continue to wean off at this time.  May need to discharge home with supplemental O2. Up with OT this AM. Plan for discharge home after morning session of therapy.  DVT Prophylaxis - Lovenox, Foot Pumps, and TED hose Non-weightbearing to the right arm.  Lindsay Lenix Benoist, PA-C [facility] 09/06/2021, 1:47 PM

## 2021-09-06 NOTE — Plan of Care (Signed)
  Problem: Education: Goal: Knowledge of General Education information will improve Description: Including pain rating scale, medication(s)/side effects and non-pharmacologic comfort measures 09/06/2021 1619 by Elsie Ra, RN Outcome: Completed/Met 09/06/2021 1111 by Elsie Ra, RN Outcome: Progressing   Problem: Health Behavior/Discharge Planning: Goal: Ability to manage health-related needs will improve 09/06/2021 1619 by Shawnette Augello, Debbe Mounts, RN Outcome: Completed/Met 09/06/2021 1111 by Elsie Ra, RN Outcome: Progressing   Problem: Clinical Measurements: Goal: Ability to maintain clinical measurements within normal limits will improve 09/06/2021 1619 by Danitra Payano, Debbe Mounts, RN Outcome: Completed/Met 09/06/2021 1111 by Elsie Ra, RN Outcome: Progressing Goal: Will remain free from infection 09/06/2021 1619 by Elsie Ra, RN Outcome: Completed/Met 09/06/2021 1111 by Elsie Ra, RN Outcome: Progressing Goal: Diagnostic test results will improve 09/06/2021 1619 by Elsie Ra, RN Outcome: Completed/Met 09/06/2021 1111 by Elsie Ra, RN Outcome: Progressing Goal: Respiratory complications will improve 09/06/2021 1619 by Elsie Ra, RN Outcome: Completed/Met 09/06/2021 1111 by Elsie Ra, RN Outcome: Progressing Goal: Cardiovascular complication will be avoided 09/06/2021 1619 by Elsie Ra, RN Outcome: Completed/Met 09/06/2021 1111 by Elsie Ra, RN Outcome: Progressing   Problem: Activity: Goal: Risk for activity intolerance will decrease 09/06/2021 1619 by Elsie Ra, RN Outcome: Completed/Met 09/06/2021 1111 by Elsie Ra, RN Outcome: Progressing   Problem: Nutrition: Goal: Adequate nutrition will be maintained 09/06/2021 1619 by Elsie Ra, RN Outcome: Completed/Met 09/06/2021 1111 by Elsie Ra, RN Outcome: Progressing   Problem:  Coping: Goal: Level of anxiety will decrease 09/06/2021 1619 by Elsie Ra, RN Outcome: Completed/Met 09/06/2021 1111 by Elsie Ra, RN Outcome: Progressing   Problem: Elimination: Goal: Will not experience complications related to bowel motility 09/06/2021 1619 by Elsie Ra, RN Outcome: Completed/Met 09/06/2021 1111 by Elsie Ra, RN Outcome: Progressing Goal: Will not experience complications related to urinary retention 09/06/2021 1619 by Elsie Ra, RN Outcome: Completed/Met 09/06/2021 1111 by Elsie Ra, RN Outcome: Progressing   Problem: Pain Managment: Goal: General experience of comfort will improve 09/06/2021 1619 by Elsie Ra, RN Outcome: Completed/Met 09/06/2021 1111 by Elsie Ra, RN Outcome: Progressing   Problem: Safety: Goal: Ability to remain free from injury will improve 09/06/2021 1619 by Alfonzo Arca, Debbe Mounts, RN Outcome: Completed/Met 09/06/2021 1111 by Elsie Ra, RN Outcome: Progressing   Problem: Skin Integrity: Goal: Risk for impaired skin integrity will decrease 09/06/2021 1619 by Elsie Ra, RN Outcome: Completed/Met 09/06/2021 1111 by Elsie Ra, RN Outcome: Progressing

## 2021-09-06 NOTE — Discharge Instructions (Addendum)
Diet: As you were doing prior to hospitalization   Shower:  May shower but keep the wounds dry, use an occlusive plastic wrap, NO SOAKING IN TUB.  If the bandage gets wet, change with a clean dry gauze.  Dressing:  You may change your dressing as needed. Change the dressing with sterile gauze dressing.    Activity:  Increase activity slowly as tolerated, but follow the weight bearing instructions below.  No lifting or driving for 6 weeks.  Weight Bearing:   Non-weightbearing to the right arm.  Take 325mg  aspirin daily instead of 81mg  aspirin daily for 4 weeks.  To prevent constipation: you may use a stool softener such as -  Colace (over the counter) 100 mg by mouth twice a day  Drink plenty of fluids (prune juice may be helpful) and high fiber foods Miralax (over the counter) for constipation as needed.    Itching:  If you experience itching with your medications, try taking only a single pain pill, or even half a pain pill at a time.  You may take up to 10 pain pills per day, and you can also use benadryl over the counter for itching or also to help with sleep.   Precautions:  If you experience chest pain or shortness of breath - call 911 immediately for transfer to the hospital emergency department!!  If you develop a fever greater that 101 F, purulent drainage from wound, increased redness or drainage from wound, or calf pain-Call [place]                                              Follow- Up Appointment:  Please call for an appointment to be seen in 2 weeks at [facility]

## 2021-09-10 LAB — SURGICAL PATHOLOGY

## 2021-11-05 ENCOUNTER — Emergency Department
Admission: EM | Admit: 2021-11-05 | Discharge: 2021-11-05 | Disposition: A | Discharge status: Home / Self Care | Payer: Medicare Other | Attending: Emergency Medicine | Admitting: Emergency Medicine

## 2021-11-05 ENCOUNTER — Other Ambulatory Visit: Payer: Self-pay

## 2021-11-05 ENCOUNTER — Emergency Department: Payer: Medicare Other

## 2021-11-05 DIAGNOSIS — I130 Hypertensive heart and chronic kidney disease with heart failure and stage 1 through stage 4 chronic kidney disease, or unspecified chronic kidney disease: Secondary | ICD-10-CM | POA: Diagnosis not present

## 2021-11-05 DIAGNOSIS — I5033 Acute on chronic diastolic (congestive) heart failure: Secondary | ICD-10-CM | POA: Diagnosis not present

## 2021-11-05 DIAGNOSIS — Z79899 Other long term (current) drug therapy: Secondary | ICD-10-CM | POA: Insufficient documentation

## 2021-11-05 DIAGNOSIS — Z96611 Presence of right artificial shoulder joint: Secondary | ICD-10-CM | POA: Insufficient documentation

## 2021-11-05 DIAGNOSIS — Z7982 Long term (current) use of aspirin: Secondary | ICD-10-CM | POA: Insufficient documentation

## 2021-11-05 DIAGNOSIS — L282 Other prurigo: Secondary | ICD-10-CM

## 2021-11-05 DIAGNOSIS — R21 Rash and other nonspecific skin eruption: Secondary | ICD-10-CM | POA: Insufficient documentation

## 2021-11-05 DIAGNOSIS — Z794 Long term (current) use of insulin: Secondary | ICD-10-CM | POA: Diagnosis not present

## 2021-11-05 DIAGNOSIS — N183 Chronic kidney disease, stage 3 unspecified: Secondary | ICD-10-CM | POA: Insufficient documentation

## 2021-11-05 DIAGNOSIS — R0602 Shortness of breath: Secondary | ICD-10-CM | POA: Diagnosis present

## 2021-11-05 LAB — CBC
HCT: 34.5 % — ABNORMAL LOW (ref 36.0–46.0)
Hemoglobin: 11.8 g/dL — ABNORMAL LOW (ref 12.0–15.0)
MCH: 30.1 pg (ref 26.0–34.0)
MCHC: 34.2 g/dL (ref 30.0–36.0)
MCV: 88 fL (ref 80.0–100.0)
Platelets: 295 10*3/uL (ref 150–400)
RBC: 3.92 MIL/uL (ref 3.87–5.11)
RDW: 13.5 % (ref 11.5–15.5)
WBC: 12.9 10*3/uL — ABNORMAL HIGH (ref 4.0–10.5)
nRBC: 0 % (ref 0.0–0.2)

## 2021-11-05 LAB — BASIC METABOLIC PANEL
Anion gap: 11 (ref 5–15)
BUN: 39 mg/dL — ABNORMAL HIGH (ref 8–23)
CO2: 23 mmol/L (ref 22–32)
Calcium: 9.8 mg/dL (ref 8.9–10.3)
Chloride: 100 mmol/L (ref 98–111)
Creatinine, Ser: 1.48 mg/dL — ABNORMAL HIGH (ref 0.44–1.00)
GFR, Estimated: 37 mL/min — ABNORMAL LOW (ref >60)
Glucose, Bld: 368 mg/dL — ABNORMAL HIGH (ref 70–99)
Potassium: 3.9 mmol/L (ref 3.5–5.1)
Sodium: 134 mmol/L — ABNORMAL LOW (ref 135–145)

## 2021-11-05 LAB — TROPONIN I (HIGH SENSITIVITY)
Troponin I (High Sensitivity): 10 ng/L (ref <18)
Troponin I (High Sensitivity): 11 ng/L (ref <18)

## 2021-11-05 MED ORDER — BUMETANIDE 0.5 MG PO TABS: 0.5000 mg | ORAL_TABLET | Freq: Every day | ORAL | 0 refills | Status: DC

## 2021-11-05 MED ORDER — BUMETANIDE 0.25 MG/ML IJ SOLN
1.0000 mg | Freq: Once | INTRAMUSCULAR | Status: AC
  Administered 2021-11-05: 16:00:00 1 mg via INTRAVENOUS
  Filled 2021-11-05 (×2): qty 4

## 2021-11-05 NOTE — ED Provider Notes (Signed)
[facility] : [facility] Emergency Department Provider Note   ____________________________________________   Event Date/Time   First MD Initiated Contact with Patient 11/05/21 1415     (approximate)  I have reviewed the triage vital signs and the nursing notes.   HISTORY  Chief Complaint Shortness of Breath and Hypertension    HPI Lindsay Glenn is a 75 y.o. female with past medical history of hypertension, hyperlipidemia, diabetes, CKD, CHF, stroke, and GERD who presents to the ED complaining of shortness of breath.  Patient states that she initially noticed itchy red rash to her chest and arms about 5 days ago.  She states that she dealt with a similar rash in the past while taking furosemide, had been switched to torsemide with improvement a couple of years ago.  She thought that her symptoms more recently were related to the torsemide and she stopped taking this 4 days ago.  She states that the rash has seemed to improve but that she has had weight gain, leg swelling, and dyspnea on exertion.  She complains of a dry cough but denies any chest pain or fevers.  Due to the weight gain, she took a dose of torsemide yesterday evening with significant urine production since then.  She denies any shortness of breath at rest but states she gets out of breath easily when walking.  Rash is overall improving, but she continues to have red raised areas to her chest that are itchy.        Past Medical History:  Diagnosis Date   (HFpEF) heart failure with preserved ejection fraction ([place])    a.) TTE 02/07/2020: EF 55%. b.) TTE 08/26/2021: EF >55%; mild BAE   Aortic stenosis    a.) TTE 02/07/2020: EF 55%; trivial MR; mild AS with MPG 8 mmHg. b.) TTE 08/26/2021: EF >55%; mild MR/TR/PR; mild BAE; mild AS with MPG 21 mmHg.   Arthritis    BILATERAL rotator cuff tendinitis    CKD (chronic kidney disease), stage III (HCC)    Complication of anesthesia    a.) (+) aspiration even during  colonoscopy   CTS (carpal tunnel syndrome)    Depression    DOE (dyspnea on exertion)    Dyspnea    Gastroparesis    GERD (gastroesophageal reflux disease)    Hashimoto's thyroiditis    Heart murmur    History of hiatal hernia    HLD (hyperlipidemia)    Hypertension    IDA (iron deficiency anemia)    Insomnia    LADA (latent autoimmune diabetes in adults), managed as type 1 ([place])    a.) uses insulin pump   Obesity    Osteopenia    Osteoporosis    Pancreatic cyst    Pneumonia    PONV (postoperative nausea and vomiting)    Pseudophakia of both eyes    Renal cyst, right    Restless leg syndrome    Sleep apnea    a.) unable to tolerate nocturnal PAP therapy   Stroke ([place]) 2017   field of vision lower on right eye   TIA (transient ischemic attack) 2017    Patient Active Problem List   Diagnosis Date Noted   Obstructive sleep apnea 09/06/2021   Status post total shoulder arthroplasty, right 09/05/2021    Past Surgical History:  Procedure Laterality Date   ABDOMINAL HYSTERECTOMY  1994   ANKLE FRACTURE SURGERY Right 2004   screws;plate   BRACHIOPLASTY Bilateral 2018   fat removal from both upper arms  BREAST SURGERY  2000   bilateral mastectomy   CARPAL TUNNEL RELEASE Right 2004   CARPAL TUNNEL RELEASE Right 04/04/2020   Procedure: OPEN CARPAL TUNNEL RELEASE;  Surgeon: Corky Mull, MD;  Location: [facility] ORS;  Service: Orthopedics;  Laterality: Right;   COLONOSCOPY     JOINT REPLACEMENT Bilateral    knee   SHOULDER ARTHROSCOPY Right 2016   TOTAL SHOULDER ARTHROPLASTY Right 09/05/2021   Procedure: TOTAL SHOULDER ARTHROPLASTY;  Surgeon: Corky Mull, MD;  Location: [facility] ORS;  Service: Orthopedics;  Laterality: Right;   UPPER ESOPHAGEAL ENDOSCOPIC ULTRASOUND (EUS)  2020   pancreas    Prior to Admission medications   Medication Sig Start Date End Date Taking? Authorizing Provider  bumetanide (BUMEX) 0.5 MG tablet Take 1 tablet (0.5 mg total) by mouth daily. 11/05/21  12/05/21 Yes Blake Divine, MD  acetaminophen (TYLENOL) 500 MG tablet Take 2 tablets (1,000 mg total) by mouth every 6 (six) hours. 09/06/21   Lattie Corns, PA-C  amLODipine (NORVASC) 10 MG tablet Take 10 mg by mouth daily. 07/12/21   [provider]  aspirin EC 325 MG tablet Take 1 tablet (325 mg total) by mouth daily. 09/06/21   Lattie Corns, PA-C  b complex vitamins tablet Take 1 tablet by mouth daily.    [provider]  carbamazepine (TEGRETOL XR) 100 MG 12 hr tablet Take 100 mg by mouth 2 (two) times daily. 08/08/21   [provider]  Cholecalciferol (VITAMIN D) 50 MCG (2000 UT) tablet Take 2,000 Units by mouth daily.    [provider]  escitalopram (LEXAPRO) 10 MG tablet Take 10 mg by mouth daily. 09/18/20   [provider]  insulin aspart (NOVOLOG) 100 UNIT/ML injection Inject 70-80 Units into the skin daily. Via OmniPod Insulin pump 01/18/20   [provider]  Insulin Human (INSULIN PUMP) SOLN Inject into the skin. Novolog    [provider]  losartan (COZAAR) 50 MG tablet Take 50 mg by mouth at bedtime. 07/22/20   [provider]  metoprolol succinate (TOPROL-XL) 25 MG 24 hr tablet Take 12.5 mg by mouth daily. 08/23/21   [provider]  Multiple Vitamins-Minerals (MULTIVITAMIN WITH MINERALS) tablet Take 1 tablet by mouth daily.    [provider]  naproxen sodium (ALEVE) 220 MG tablet Take 220 mg by mouth as needed.    [provider]  omeprazole (PRILOSEC) 20 MG capsule Take 20 mg by mouth as needed. 12/09/19   [provider]  ondansetron (ZOFRAN) 4 MG tablet Take 1 tablet (4 mg total) by mouth every 6 (six) hours as needed for nausea. 09/06/21   Lattie Corns, PA-C  oxyCODONE (OXY IR/ROXICODONE) 5 MG immediate release tablet Take 1-2 tablets (5-10 mg total) by mouth every 4 (four) hours as needed for moderate pain (pain score 4-6). 09/06/21   Lattie Corns, PA-C   pravastatin (PRAVACHOL) 80 MG tablet Take 80 mg by mouth at bedtime. 12/10/19   [provider]  rOPINIRole (REQUIP) 2 MG tablet Take 2 mg by mouth at bedtime. 12/16/19   [provider]  tiZANidine (ZANAFLEX) 2 MG tablet Take 2 mg by mouth at bedtime as needed for muscle spasms. 08/16/21   [provider]  torsemide (DEMADEX) 10 MG tablet Take 10 mg by mouth daily. 08/16/20   [provider]  torsemide (DEMADEX) 5 MG tablet Take 5 mg by mouth daily. 05/01/21   [provider]    Allergies Empagliflozin, Tape,  Atorvastatin, Furosemide, Lisinopril, Rosuvastatin, and Trulicity [dulaglutide]  Family History  Problem Relation Age of Onset   Alzheimer's disease Mother    Cancer Father     Social History Social History   Tobacco Use   Smoking status: Never   Smokeless tobacco: Never  Vaping Use   Vaping Use: Never used  Substance Use Topics   Alcohol use: Yes    Comment: rarely   Drug use: Never    Review of Systems  Constitutional: No fever/chills Eyes: No visual changes. ENT: No sore throat. Cardiovascular: Denies chest pain. Respiratory: Positive for cough and shortness of breath. Gastrointestinal: No abdominal pain.  No nausea, no vomiting.  No diarrhea.  No constipation. Genitourinary: Negative for dysuria. Musculoskeletal: Negative for back pain.  Positive for leg swelling. Skin: Positive for rash. Neurological: Negative for headaches, focal weakness or numbness.  ____________________________________________   PHYSICAL EXAM:  VITAL SIGNS: ED Triage Vitals  Enc Vitals Group     BP 11/05/21 1213 (!) 161/68     Pulse Rate 11/05/21 1213 90     Resp 11/05/21 1213 14     Temp 11/05/21 1213 98.5 F (36.9 C)     Temp Source 11/05/21 1213 Oral     SpO2 11/05/21 1213 96 %     Weight 11/05/21 1213 186 lb (84.4 kg)     Height 11/05/21 1213 5' (1.524 m)     Head Circumference --      Peak Flow --      Pain Score 11/05/21 1213  0     Pain Loc --      Pain Edu? --      Excl. in [place]? --     Constitutional: Alert and oriented. Eyes: Conjunctivae are normal. Head: Atraumatic. Nose: No congestion/rhinnorhea. Mouth/Throat: Mucous membranes are moist. Neck: Normal ROM Cardiovascular: Normal rate, regular rhythm. Grossly normal heart sounds.  2+ radial pulses bilaterally. Respiratory: Normal respiratory effort.  No retractions. Lungs CTAB. Gastrointestinal: Soft and nontender. No distention. Genitourinary: deferred Musculoskeletal: No lower extremity tenderness, 1+ pain edema to knees bilaterally. Neurologic:  Normal speech and language. No gross focal neurologic deficits are appreciated. Skin:  Skin is warm, dry and intact. No rash noted. Psychiatric: Mood and affect are normal. Speech and behavior are normal.  ____________________________________________   LABS (all labs ordered are listed, but only abnormal results are displayed)  Labs Reviewed  CBC - Abnormal; Notable for the following components:      Result Value   WBC 12.9 (*)    Hemoglobin 11.8 (*)    HCT 34.5 (*)    All other components within normal limits  BASIC METABOLIC PANEL - Abnormal; Notable for the following components:   Sodium 134 (*)    Glucose, Bld 368 (*)    BUN 39 (*)    Creatinine, Ser 1.48 (*)    GFR, Estimated 37 (*)    All other components within normal limits  TROPONIN I (HIGH SENSITIVITY)  TROPONIN I (HIGH SENSITIVITY)   ____________________________________________  EKG  ED ECG REPORT I, Blake Divine, the attending physician, personally viewed and interpreted this ECG.   Date: 11/05/2021  EKG Time: 12:14  Rate: 82  Rhythm: normal sinus rhythm  Axis: Normal  Intervals:none  ST&T Change: Inferolateral T wave inversions   PROCEDURES  Procedure(s) performed (including Critical Care):  Procedures   ____________________________________________   INITIAL IMPRESSION / ASSESSMENT AND PLAN / ED COURSE       75 year old female with past medical history  of hypertension, hyperlipidemia, diabetes, CHF, CKD, stroke, and GERD who presents to the ED with increasing shortness of breath and leg swelling after she stopped taking her torsemide 5 days ago due to concern for rash.  Patient is not in any respiratory distress and is maintaining O2 sats on room air.  Chest x-ray reviewed by me and shows mild pulmonary edema with no focal infiltrates, patient appears clinically fluid overloaded with lower extremity edema.  She denies any chest pain, EKG shows no evidence of arrhythmia or ischemia and troponin is negative.  We will trend troponin and give trial dose of IV Bumex to see if she is able to tolerate this without any reaction.  Plan to ambulate on pulse ox following diuresis.  Patient with no evidence of rash or itching following dose of Bumex.  She was subsequently able to ambulate with no drop in O2 sat, is feeling well with minimal difficulty breathing at this time.  She is appropriate for discharge home with follow-up in the CHF clinic or with her PCP.  She was counseled to return to the ED for new worsening symptoms, patient agrees with plan.      ____________________________________________   FINAL CLINICAL IMPRESSION(S) / ED DIAGNOSES  Final diagnoses:  Acute on chronic diastolic congestive heart failure (HCC)  Pruritic rash     ED Discharge Orders          Ordered    bumetanide (BUMEX) 0.5 MG tablet  Daily        11/05/21 1729    AMB referral to CHF clinic        11/05/21 1729             Note:  This document was prepared using Dragon voice recognition software and may include unintentional dictation errors.    Blake Divine, MD 11/05/21 [phone]

## 2021-11-05 NOTE — ED Triage Notes (Signed)
Pt here with SOB and hypertension since last night. Pt also had a steroid shot yesterday that she believes elevated her blood glucose. Pt is also on a diuretic and has been having some itching all over which she believes is related. Pt took a Benadryl for relief. Pt in no distress in triage.

## 2021-11-05 NOTE — ED Notes (Signed)
D/C and reasons to return to ED discussed with pt, pt verbalized understanding. Pt ambulatory with steady gait on D/C.  

## 2021-11-05 NOTE — ED Notes (Signed)
See triage note. Pt sitting calmly in chair; resp reg/unlabored; skin dry; pt reports "I lost 6lb since last night after taking my lasix"; states legs swollen lately; nonpitting edema at R ankle; L 1+ pitting edema at L ankle. EDP [place] at bedside.

## 2021-11-11 NOTE — Progress Notes (Signed)
Patient ID: Lindsay Glenn, female    DOB: Jan 15, 1946, 75 y.o.   MRN: [id number]  HPI  Ms Ridinger is a 75 y/o female with a history of DM (autoimmune), hyperlipidemia, HTN, CKD, stroke, thyroid disease, aortic stenosis, carpal tunnel syndrome, depression, GERD, iron deficiency anemia, osteoporosis, restless leg syndrome, sleep apnea, TIA and chronic heart failure.   Echo report from 08/26/21 reviewed and showed an EF of >55% along with moderate LVH.  Was in the ED 11/05/21 due to acute on chronic heart failure. Also noted a rash on her chest which was thought to possibly be due to diuretic. Had previous issue with this when previously taking furosemide. She stopped her torsemide due to rash and then developed shortness of breath so she took a dose of torsemide. Given IV bumex without rash or reaction so placed on oral bumex and released.    She presents today for her initial visit with a chief complaint of moderate fatigue upon minimal exertion. She describes this as chronic in nature. She has associated cough, shortness of breath, pedal edema, right shoulder pain and chronic difficulty sleeping along with this. She denies any dizziness, abdominal distention, palpitations, chest pain or weight gain.   Has has a chronic, itchy rash for several months. Thought initially to be related to furosemide with change to torsemide. Rash improved some but then re-occurred so it was thought to be related to torsemide so she's currently in bumex. Continues to have the rash but doesn't think it's as bad as it was. Years ago had allergy testing completed and PCP says they may have to re-visit this if the rash continues.   Past Medical History:  Diagnosis Date   (HFpEF) heart failure with preserved ejection fraction ([place])    a.) TTE 02/07/2020: EF 55%. b.) TTE 08/26/2021: EF >55%; mild BAE   Aortic stenosis    a.) TTE 02/07/2020: EF 55%; trivial MR; mild AS with MPG 8 mmHg. b.) TTE 08/26/2021: EF >55%; mild MR/TR/PR;  mild BAE; mild AS with MPG 21 mmHg.   Arthritis    BILATERAL rotator cuff tendinitis    CHF (congestive heart failure) (HCC)    CKD (chronic kidney disease), stage III (HCC)    Complication of anesthesia    a.) (+) aspiration even during colonoscopy   CTS (carpal tunnel syndrome)    Depression    DOE (dyspnea on exertion)    Dyspnea    Gastroparesis    GERD (gastroesophageal reflux disease)    Hashimoto's thyroiditis    Heart murmur    History of hiatal hernia    HLD (hyperlipidemia)    Hypertension    IDA (iron deficiency anemia)    Insomnia    LADA (latent autoimmune diabetes in adults), managed as type 1 ([place])    a.) uses insulin pump   Obesity    Osteopenia    Osteoporosis    Pancreatic cyst    Pneumonia    PONV (postoperative nausea and vomiting)    Pseudophakia of both eyes    Renal cyst, right    Restless leg syndrome    Sleep apnea    a.) unable to tolerate nocturnal PAP therapy   Stroke ([place]) 2017   field of vision lower on right eye   TIA (transient ischemic attack) 2017   Past Surgical History:  Procedure Laterality Date   ABDOMINAL HYSTERECTOMY  1994   ANKLE FRACTURE SURGERY Right 2004   screws;plate   BRACHIOPLASTY Bilateral 2018  fat removal from both upper arms   BREAST SURGERY  2000   bilateral mastectomy   CARPAL TUNNEL RELEASE Right 2004   CARPAL TUNNEL RELEASE Right 04/04/2020   Procedure: OPEN CARPAL TUNNEL RELEASE;  Surgeon: Corky Mull, MD;  Location: [facility] ORS;  Service: Orthopedics;  Laterality: Right;   COLONOSCOPY     JOINT REPLACEMENT Bilateral    knee   SHOULDER ARTHROSCOPY Right 2016   TOTAL SHOULDER ARTHROPLASTY Right 09/05/2021   Procedure: TOTAL SHOULDER ARTHROPLASTY;  Surgeon: Corky Mull, MD;  Location: [facility] ORS;  Service: Orthopedics;  Laterality: Right;   UPPER ESOPHAGEAL ENDOSCOPIC ULTRASOUND (EUS)  2020   pancreas   Family History  Problem Relation Age of Onset   Alzheimer's disease Mother    Cancer Father     Social History   Tobacco Use   Smoking status: Never   Smokeless tobacco: Never  Substance Use Topics   Alcohol use: Yes    Comment: rarely   Allergies  Allergen Reactions   Empagliflozin Nausea And Vomiting and Other (See Comments)    Decreased energy ([place])   Tape Other (See Comments)    Causes rash, blistering( if on for several days)   Paper tape and tegaderm  is OK   Atorvastatin Other (See Comments)    Increases blood sugar   Furosemide Itching and Rash   Lisinopril Cough   Rosuvastatin Other (See Comments)    Increases blood sugar   Trulicity [Dulaglutide] Diarrhea and Nausea And Vomiting   Prior to Admission medications   Medication Sig Start Date End Date Taking? Authorizing Provider  amLODipine (NORVASC) 10 MG tablet Take 10 mg by mouth daily. 07/12/21  Yes [provider]  aspirin EC 325 MG tablet Take 1 tablet (325 mg total) by mouth daily. 09/06/21  Yes Lattie Corns, PA-C  b complex vitamins tablet Take 1 tablet by mouth daily.   Yes [provider]  bumetanide (BUMEX) 0.5 MG tablet Take 1 tablet (0.5 mg total) by mouth daily. 11/05/21 12/05/21 Yes Blake Divine, MD  cephALEXin (KEFLEX) 500 MG capsule Take 500 mg by mouth as needed. For dental appointments   Yes [provider]  Cholecalciferol (VITAMIN D) 50 MCG (2000 UT) tablet Take 2,000 Units by mouth daily.   Yes [provider]  escitalopram (LEXAPRO) 10 MG tablet Take 10 mg by mouth daily. 09/18/20  Yes [provider]  hydrALAZINE (APRESOLINE) 10 MG tablet Take 5 mg by mouth as needed.   Yes [provider]  insulin aspart (NOVOLOG) 100 UNIT/ML injection Inject 70-80 Units into the skin daily. Via OmniPod Insulin pump 01/18/20  Yes [provider]  Insulin Human (INSULIN PUMP) SOLN Inject into the skin. Novolog   Yes [provider]  losartan (COZAAR) 50 MG tablet Take 50 mg by mouth at bedtime. 07/22/20  Yes [provider]  Multiple Vitamins-Minerals (MULTIVITAMIN WITH MINERALS) tablet Take 1 tablet by mouth daily.   Yes [provider]  naproxen sodium (ALEVE) 220 MG tablet Take 220 mg by mouth as needed.   Yes [provider]  omeprazole (PRILOSEC) 20 MG capsule Take 20 mg by mouth as needed. 12/09/19  Yes [provider]  pravastatin (PRAVACHOL) 80 MG tablet Take 80 mg by mouth at bedtime. 12/10/19  Yes [provider]  rOPINIRole (REQUIP) 2 MG tablet Take 2 mg by mouth at bedtime. 12/16/19  Yes [provider]  carbamazepine (TEGRETOL XR) 100 MG 12 hr tablet  Take 100 mg by mouth 2 (two) times daily. Patient not taking: Reported on 11/13/2021 08/08/21   [provider]  metoprolol succinate (TOPROL-XL) 25 MG 24 hr tablet Take 12.5 mg by mouth daily. Patient not taking: Reported on 11/13/2021 08/23/21   [provider]  tiZANidine (ZANAFLEX) 2 MG tablet Take 2 mg by mouth at bedtime as needed for muscle spasms. Patient not taking: Reported on 11/13/2021 08/16/21   [provider]   Review of Systems  Constitutional:  Positive for fatigue (easily). Negative for appetite change.  HENT:  Negative for congestion, postnasal drip and sore throat.   Eyes: Negative.   Respiratory:  Positive for cough (dry) and shortness of breath (easily). Negative for chest tightness.   Cardiovascular:  Positive for leg swelling. Negative for chest pain and palpitations.  Gastrointestinal:  Negative for abdominal distention and abdominal pain.  Endocrine: Negative.   Genitourinary: Negative.   Musculoskeletal:  Positive for arthralgias (right shoulder).  Skin: Negative.   Allergic/Immunologic: Negative.   Neurological:  Negative for dizziness and light-headedness.  Hematological:  Negative for adenopathy. Does not bruise/bleed easily.  Psychiatric/Behavioral:  Positive for sleep disturbance (chronic difficulty sleeping; sleeping on 1 pillow).  Negative for agitation. The patient is not nervous/anxious.    Vitals:   11/13/21 1100  BP: (!) 149/56  Pulse: 73  Resp: 20  SpO2: 98%  Weight: 191 lb 2 oz (86.7 kg)  Height: 5' (1.524 m)   Wt Readings from Last 3 Encounters:  11/13/21 191 lb 2 oz (86.7 kg)  11/05/21 186 lb (84.4 kg)  09/05/21 192 lb 14.4 oz (87.5 kg)   Lab Results  Component Value Date   CREATININE 1.48 (H) 11/05/2021   CREATININE 1.91 (H) 09/06/2021   CREATININE 1.34 (H) 08/29/2021   Physical Exam Constitutional:      Appearance: She is well-developed.  HENT:     Head: Normocephalic and atraumatic.  Neck:     Vascular: No JVD.  Cardiovascular:     Rate and Rhythm: Normal rate and regular rhythm.  Pulmonary:     Effort: Pulmonary effort is normal. No accessory muscle usage.     Breath sounds: No wheezing or rales.  Abdominal:     Palpations: Abdomen is soft.     Tenderness: There is no abdominal tenderness.  Musculoskeletal:     Cervical back: Normal range of motion.     Right lower leg: No tenderness. Edema (2+ pitting) present.     Left lower leg: No tenderness. Edema (2+ pitting) present.  Skin:    General: Skin is warm and dry.  Neurological:     General: No focal deficit present.     Mental Status: She is alert and oriented to person, place, and time.  Psychiatric:        Mood and Affect: Mood normal.        Behavior: Behavior normal.   Assessment & Plan:  1: Chronic heart failure with preserved ejection fraction with structural changes (LVH)- - NYHA class  III - euvolemic today - weighing daily; reminded to call for an overnight weight gain of > 2 pounds or a weekly weight gain of > 5 pounds - not adding salt and trying to be mindful of sodium content of foods; low sodium cookbook provided - saw cardiology Corky Sox) 08/23/21; returns in a couple of weeks - now on bumex and rash doesn't seem to have worsened although still persists - discussed changing losartan to entresto in the future;  just received a 90 day supply of losartan - jardiance caused decreased energy; would not use farxiga since patient is type 1/ 1.5 diabetic  2: HTN- - BP mildly elevated (149/56) - does have hydralazine that she takes PRN for HTN; took a dose recently - saw PCP Rosendo Gros) 10/16/21; returns March 2023 - BMP 11/05/21 reviewed and showed sodium 134, potassium 3.9, creatinine 1.48 and GFR 37  3: Type 1 DM- - A1c 10/16/21 was 8.1% - sees endocrinology in [place] but is hoping to transfer her diabetes care here locally - is interested in ozempic or something similar to aid in weight loss; encouraged to speak with PCP or endocrinologist about this - using insulin pump  4: Lymphedema- - stage 2 - has worn compression socks in the past but not recently; encouraged to resume wearing them daily with removal at bedtime - elevate legs when sitting for long periods of time - limited in her ability to exercise due to her symptoms - consider compression boots if edema persists; brochure provided  5: Rash- - thought to be related to diuretic but is currently on bumex without worsening of rash - had allergy testing years ago and PCP says this may have to be re-visited in the future - can try OTC claritin or zrytec to see if this helps; she says that benadryl makes her too groggy   Medication bottles reviewed.   Return in 6 weeks or sooner for any questions/problems before then.

## 2021-11-13 ENCOUNTER — Encounter: Payer: Self-pay | Admitting: Family

## 2021-11-13 ENCOUNTER — Ambulatory Visit: Payer: Medicare Other | Attending: Family | Admitting: Family

## 2021-11-13 ENCOUNTER — Other Ambulatory Visit: Payer: Self-pay

## 2021-11-13 VITALS — BP 149/56 | HR 73 | Resp 20 | Ht 60.0 in | Wt 191.1 lb

## 2021-11-13 DIAGNOSIS — N183 Chronic kidney disease, stage 3 unspecified: Secondary | ICD-10-CM | POA: Insufficient documentation

## 2021-11-13 DIAGNOSIS — I130 Hypertensive heart and chronic kidney disease with heart failure and stage 1 through stage 4 chronic kidney disease, or unspecified chronic kidney disease: Secondary | ICD-10-CM | POA: Diagnosis not present

## 2021-11-13 DIAGNOSIS — Z8673 Personal history of transient ischemic attack (TIA), and cerebral infarction without residual deficits: Secondary | ICD-10-CM | POA: Insufficient documentation

## 2021-11-13 DIAGNOSIS — Z794 Long term (current) use of insulin: Secondary | ICD-10-CM | POA: Diagnosis not present

## 2021-11-13 DIAGNOSIS — G4730 Sleep apnea, unspecified: Secondary | ICD-10-CM | POA: Insufficient documentation

## 2021-11-13 DIAGNOSIS — E1043 Type 1 diabetes mellitus with diabetic autonomic (poly)neuropathy: Secondary | ICD-10-CM | POA: Insufficient documentation

## 2021-11-13 DIAGNOSIS — I5032 Chronic diastolic (congestive) heart failure: Secondary | ICD-10-CM | POA: Diagnosis present

## 2021-11-13 DIAGNOSIS — I10 Essential (primary) hypertension: Secondary | ICD-10-CM

## 2021-11-13 DIAGNOSIS — E785 Hyperlipidemia, unspecified: Secondary | ICD-10-CM | POA: Insufficient documentation

## 2021-11-13 DIAGNOSIS — Z9641 Presence of insulin pump (external) (internal): Secondary | ICD-10-CM | POA: Insufficient documentation

## 2021-11-13 DIAGNOSIS — I890 Lymphedema, not elsewhere classified: Secondary | ICD-10-CM | POA: Diagnosis not present

## 2021-11-13 DIAGNOSIS — R21 Rash and other nonspecific skin eruption: Secondary | ICD-10-CM | POA: Diagnosis not present

## 2021-11-13 DIAGNOSIS — E1022 Type 1 diabetes mellitus with diabetic chronic kidney disease: Secondary | ICD-10-CM

## 2021-11-13 DIAGNOSIS — N1832 Chronic kidney disease, stage 3b: Secondary | ICD-10-CM

## 2021-11-13 DIAGNOSIS — K219 Gastro-esophageal reflux disease without esophagitis: Secondary | ICD-10-CM | POA: Diagnosis not present

## 2021-11-13 NOTE — Patient Instructions (Addendum)
Continue weighing daily and call for an overnight weight gain of 3 pounds or more or a weekly weight gain of more than 5 pounds.    Wear compression socks daily with removal at bedtime. Elevate legs when sitting for long periods of time.    We will consider changing your losartan to entresto in the future

## 2021-11-24 ENCOUNTER — Other Ambulatory Visit: Payer: Self-pay

## 2021-11-24 ENCOUNTER — Ambulatory Visit
Admission: EM | Admit: 2021-11-24 | Discharge: 2021-11-24 | Disposition: A | Discharge status: Home / Self Care | Payer: Medicare Other | Attending: Physician Assistant | Admitting: Physician Assistant

## 2021-11-24 ENCOUNTER — Encounter: Payer: Self-pay | Admitting: Emergency Medicine

## 2021-11-24 DIAGNOSIS — R0981 Nasal congestion: Secondary | ICD-10-CM

## 2021-11-24 DIAGNOSIS — R051 Acute cough: Secondary | ICD-10-CM | POA: Diagnosis present

## 2021-11-24 DIAGNOSIS — I350 Nonrheumatic aortic (valve) stenosis: Secondary | ICD-10-CM | POA: Diagnosis not present

## 2021-11-24 DIAGNOSIS — E063 Autoimmune thyroiditis: Secondary | ICD-10-CM | POA: Diagnosis not present

## 2021-11-24 DIAGNOSIS — I5032 Chronic diastolic (congestive) heart failure: Secondary | ICD-10-CM | POA: Diagnosis not present

## 2021-11-24 DIAGNOSIS — E1122 Type 2 diabetes mellitus with diabetic chronic kidney disease: Secondary | ICD-10-CM | POA: Diagnosis not present

## 2021-11-24 DIAGNOSIS — N183 Chronic kidney disease, stage 3 unspecified: Secondary | ICD-10-CM | POA: Insufficient documentation

## 2021-11-24 DIAGNOSIS — E785 Hyperlipidemia, unspecified: Secondary | ICD-10-CM | POA: Diagnosis not present

## 2021-11-24 DIAGNOSIS — Z6836 Body mass index (BMI) 36.0-36.9, adult: Secondary | ICD-10-CM | POA: Diagnosis not present

## 2021-11-24 DIAGNOSIS — E669 Obesity, unspecified: Secondary | ICD-10-CM | POA: Diagnosis not present

## 2021-11-24 DIAGNOSIS — I130 Hypertensive heart and chronic kidney disease with heart failure and stage 1 through stage 4 chronic kidney disease, or unspecified chronic kidney disease: Secondary | ICD-10-CM | POA: Insufficient documentation

## 2021-11-24 DIAGNOSIS — R0602 Shortness of breath: Secondary | ICD-10-CM | POA: Insufficient documentation

## 2021-11-24 DIAGNOSIS — Z20822 Contact with and (suspected) exposure to covid-19: Secondary | ICD-10-CM | POA: Insufficient documentation

## 2021-11-24 DIAGNOSIS — Z8673 Personal history of transient ischemic attack (TIA), and cerebral infarction without residual deficits: Secondary | ICD-10-CM | POA: Diagnosis not present

## 2021-11-24 DIAGNOSIS — J069 Acute upper respiratory infection, unspecified: Secondary | ICD-10-CM | POA: Insufficient documentation

## 2021-11-24 LAB — RESP PANEL BY RT-PCR (FLU A&B, COVID) ARPGX2
Influenza A by PCR: NEGATIVE
Influenza B by PCR: NEGATIVE
SARS Coronavirus 2 by RT PCR: NEGATIVE

## 2021-11-24 NOTE — ED Provider Notes (Signed)
[facility]    CSN: [id number] Arrival date & time: 11/24/21  1013      History   Chief Complaint Chief Complaint  Patient presents with   Cough    HPI Lindsay Glenn is a 76 y.o. female presenting for onset of cough and congestion last night.  Reports fatigue.  Reports chronic shortness of breath due to heart failure.  No increased swelling of lower extremities.  Does not report associated fever, body aches, sore throat, chest pain, nausea/vomiting or diarrhea.  Has been taking Mucinex.  No known COVID or flu exposure.  Her husband is sick with similar symptoms.  Patient has a past medical history significant for heart failure, aortic stenosis, C Kd, DOE, Hashimoto's thyroiditis, hypertension, hyperlipidemia, obesity, diabetes and history of stroke/TIA.   HPI  Past Medical History:  Diagnosis Date   (HFpEF) heart failure with preserved ejection fraction ([place])    a.) TTE 02/07/2020: EF 55%. b.) TTE 08/26/2021: EF >55%; mild BAE   Aortic stenosis    a.) TTE 02/07/2020: EF 55%; trivial MR; mild AS with MPG 8 mmHg. b.) TTE 08/26/2021: EF >55%; mild MR/TR/PR; mild BAE; mild AS with MPG 21 mmHg.   Arthritis    BILATERAL rotator cuff tendinitis    CHF (congestive heart failure) (HCC)    CKD (chronic kidney disease), stage III (HCC)    Complication of anesthesia    a.) (+) aspiration even during colonoscopy   CTS (carpal tunnel syndrome)    Depression    DOE (dyspnea on exertion)    Dyspnea    Gastroparesis    GERD (gastroesophageal reflux disease)    Hashimoto's thyroiditis    Heart murmur    History of hiatal hernia    HLD (hyperlipidemia)    Hypertension    IDA (iron deficiency anemia)    Insomnia    LADA (latent autoimmune diabetes in adults), managed as type 1 ([place])    a.) uses insulin pump   Obesity    Osteopenia    Osteoporosis    Pancreatic cyst    Pneumonia    PONV (postoperative nausea and vomiting)    Pseudophakia of both eyes    Renal cyst, right     Restless leg syndrome    Sleep apnea    a.) unable to tolerate nocturnal PAP therapy   Stroke ([place]) 2017   field of vision lower on right eye   TIA (transient ischemic attack) 2017    Patient Active Problem List   Diagnosis Date Noted   Obstructive sleep apnea 09/06/2021   Status post total shoulder arthroplasty, right 09/05/2021    Past Surgical History:  Procedure Laterality Date   ABDOMINAL HYSTERECTOMY  1994   ANKLE FRACTURE SURGERY Right 2004   screws;plate   BRACHIOPLASTY Bilateral 2018   fat removal from both upper arms   BREAST SURGERY  2000   bilateral mastectomy   CARPAL TUNNEL RELEASE Right 2004   CARPAL TUNNEL RELEASE Right 04/04/2020   Procedure: OPEN CARPAL TUNNEL RELEASE;  Surgeon: Corky Mull, MD;  Location: [facility] ORS;  Service: Orthopedics;  Laterality: Right;   COLONOSCOPY     JOINT REPLACEMENT Bilateral    knee   SHOULDER ARTHROSCOPY Right 2016   TOTAL SHOULDER ARTHROPLASTY Right 09/05/2021   Procedure: TOTAL SHOULDER ARTHROPLASTY;  Surgeon: Corky Mull, MD;  Location: [facility] ORS;  Service: Orthopedics;  Laterality: Right;   UPPER ESOPHAGEAL ENDOSCOPIC ULTRASOUND (EUS)  2020   pancreas  OB History   No obstetric history on file.      Home Medications    Prior to Admission medications   Medication Sig Start Date End Date Taking? Authorizing Provider  amLODipine (NORVASC) 10 MG tablet Take 10 mg by mouth daily. 07/12/21  Yes [provider]  aspirin EC 325 MG tablet Take 1 tablet (325 mg total) by mouth daily. 09/06/21  Yes Lattie Corns, PA-C  b complex vitamins tablet Take 1 tablet by mouth daily.   Yes [provider]  Cholecalciferol (VITAMIN D) 50 MCG (2000 UT) tablet Take 2,000 Units by mouth daily.   Yes [provider]  escitalopram (LEXAPRO) 10 MG tablet Take 10 mg by mouth daily. 09/18/20  Yes [provider]  insulin aspart (NOVOLOG) 100 UNIT/ML injection Inject 70-80 Units into the skin  daily. Via OmniPod Insulin pump 01/18/20  Yes [provider]  Insulin Human (INSULIN PUMP) SOLN Inject into the skin. Novolog   Yes [provider]  losartan (COZAAR) 50 MG tablet Take 50 mg by mouth at bedtime. 07/22/20  Yes [provider]  Multiple Vitamins-Minerals (MULTIVITAMIN WITH MINERALS) tablet Take 1 tablet by mouth daily.   Yes [provider]  omeprazole (PRILOSEC) 20 MG capsule Take 20 mg by mouth as needed. 12/09/19  Yes [provider]  pravastatin (PRAVACHOL) 80 MG tablet Take 80 mg by mouth at bedtime. 12/10/19  Yes [provider]  rOPINIRole (REQUIP) 2 MG tablet Take 2 mg by mouth at bedtime. 12/16/19  Yes [provider]  bumetanide (BUMEX) 0.5 MG tablet Take 1 tablet (0.5 mg total) by mouth daily. 11/05/21 12/05/21  Blake Divine, MD  carbamazepine (TEGRETOL XR) 100 MG 12 hr tablet Take 100 mg by mouth 2 (two) times daily. Patient not taking: Reported on 11/13/2021 08/08/21   [provider]  cephALEXin (KEFLEX) 500 MG capsule Take 500 mg by mouth as needed. For dental appointments    [provider]  hydrALAZINE (APRESOLINE) 10 MG tablet Take 5 mg by mouth as needed.    [provider]  metoprolol succinate (TOPROL-XL) 25 MG 24 hr tablet Take 12.5 mg by mouth daily. Patient not taking: Reported on 11/13/2021 08/23/21   [provider]  naproxen sodium (ALEVE) 220 MG tablet Take 220 mg by mouth as needed.    [provider]  tiZANidine (ZANAFLEX) 2 MG tablet Take 2 mg by mouth at bedtime as needed for muscle spasms. Patient not taking: Reported on 11/13/2021 08/16/21   [provider]    Family History Family History  Problem Relation Age of Onset   Alzheimer's disease Mother    Cancer Father     Social History Social History   Tobacco Use   Smoking status: Never   Smokeless tobacco: Never  Vaping Use   Vaping Use: Never used  Substance Use Topics    Alcohol use: Yes    Comment: rarely   Drug use: Never     Allergies   Empagliflozin, Tape, Atorvastatin, Furosemide, Lisinopril, Rosuvastatin, and Trulicity [dulaglutide]   Review of Systems Review of Systems  Constitutional:  Positive for fatigue. Negative for chills, diaphoresis and fever.  HENT:  Positive for congestion and rhinorrhea. Negative for ear pain, sinus pressure, sinus pain and sore throat.   Respiratory:  Positive for cough and shortness of breath (chronic, related to CHF).   Gastrointestinal:  Negative for abdominal pain, nausea and vomiting.  Musculoskeletal:  Negative for arthralgias and myalgias.  Skin:  Negative for rash.  Neurological:  Negative for weakness and headaches.  Hematological:  Negative for adenopathy.    Physical Exam Triage Vital Signs ED Triage Vitals  Enc Vitals Group     BP 11/24/21 1041 (!) 158/65     Pulse Rate 11/24/21 1041 66     Resp 11/24/21 1041 14     Temp 11/24/21 1041 98.2 F (36.8 C)     Temp Source 11/24/21 1041 Oral     SpO2 11/24/21 1041 99 %     Weight 11/24/21 1037 187 lb (84.8 kg)     Height 11/24/21 1037 5' (1.524 m)     Head Circumference --      Peak Flow --      Pain Score 11/24/21 1037 2     Pain Loc --      Pain Edu? --      Excl. in [place]? --    No data found.  Updated Vital Signs BP (!) 158/65 (BP Location: Left Arm)    Pulse 66    Temp 98.2 F (36.8 C) (Oral)    Resp 14    Ht 5' (1.524 m)    Wt 187 lb (84.8 kg)    SpO2 99%    BMI 36.52 kg/m     Physical Exam Vitals and nursing note reviewed.  Constitutional:      General: She is not in acute distress.    Appearance: Normal appearance. She is not ill-appearing or toxic-appearing.  HENT:     Head: Normocephalic and atraumatic.     Nose: Congestion present.     Mouth/Throat:     Mouth: Mucous membranes are moist.     Pharynx: Oropharynx is clear.  Eyes:     General: No scleral icterus.       Right eye: No discharge.        Left eye: No discharge.      Conjunctiva/sclera: Conjunctivae normal.  Cardiovascular:     Rate and Rhythm: Normal rate and regular rhythm.     Heart sounds: Murmur heard.  Pulmonary:     Effort: Pulmonary effort is normal. No respiratory distress.     Breath sounds: Normal breath sounds. No wheezing, rhonchi or rales.  Musculoskeletal:     Cervical back: Neck supple.     Right lower leg: No edema.     Left lower leg: No edema.  Skin:    General: Skin is dry.  Neurological:     General: No focal deficit present.     Mental Status: She is alert. Mental status is at baseline.     Motor: No weakness.     Gait: Gait normal.  Psychiatric:        Mood and Affect: Mood normal.        Behavior: Behavior normal.        Thought Content: Thought content normal.     UC Treatments / Results  Labs (all labs ordered are listed, but only abnormal results are displayed) Labs Reviewed  RESP PANEL BY RT-PCR (FLU A&B, COVID) [id number]    EKG   Radiology No results found.  Procedures Procedures (including critical care time)  Medications Ordered in UC Medications - No data to display  Initial Impression / Assessment and Plan / UC Course  I have reviewed the triage vital signs and the nursing notes.  Pertinent labs & imaging results that were available during my care of the patient were reviewed by me and  considered in my medical decision making (see chart for details).  76 year old female presenting for onset of cough and congestion last night.  Has been sick with similar symptoms.  No known COVID or flu exposure.  Patient is afebrile.  She is overall well-appearing.  She has mild nasal congestion on exam.  Chest is clear to auscultation heart regular rate and rhythm.  No lower extremity edema.  Respiratory panel negative for flu and COVID.  Discussed results with patient.  Advised her that she has a viral URI, likely the same as her husband.  Supportive care encouraged increasing rest and fluids and continue  Mucinex.  Add Flonase and nasal saline, Tylenol for discomfort.  Advised patient she may be sick for 7 to 10 days but she should be seen again for any worsening of symptoms or if not better after 10 days or if she develops a fever, worsening cough, chest pain or shortness of breath.  Advise follow-up with PCP.   Final Clinical Impressions(s) / UC Diagnoses   Final diagnoses:  Viral upper respiratory tract infection  Acute cough  Nasal congestion     Discharge Instructions      -You are negative for COVID and flu - You have a viral upper respiratory infection.  Most people get better within 7 to 10 days. - Increase rest and fluids and continue Mucinex.  Consider Flonase and nasal saline. - Tylenol for any aches or discomfort. -You should be seen again if you feel that your symptoms are not better after 10 days or if you develop a fever or increased breathing difficulty.  Please contact your PCP or return for reevaluation.     ED Prescriptions   None    PDMP not reviewed this encounter.   Danton Clap, PA-C 11/24/21 1143

## 2021-11-24 NOTE — ED Triage Notes (Signed)
Patient c/o cough and congestion that started last night.  Patient denies fevers.

## 2021-11-24 NOTE — Discharge Instructions (Signed)
-  You are negative for COVID and flu - You have a viral upper respiratory infection.  Most people get better within 7 to 10 days. - Increase rest and fluids and continue Mucinex.  Consider Flonase and nasal saline. - Tylenol for any aches or discomfort. -You should be seen again if you feel that your symptoms are not better after 10 days or if you develop a fever or increased breathing difficulty.  Please contact your PCP or return for reevaluation.

## 2021-12-23 ENCOUNTER — Ambulatory Visit: Payer: Medicare Other | Admitting: Family

## 2022-10-08 ENCOUNTER — Ambulatory Visit
Admission: RE | Admit: 2022-10-08 | Discharge: 2022-10-08 | Disposition: A | Discharge status: Home / Self Care | Payer: Medicare Other | Source: Ambulatory Visit | Attending: Emergency Medicine | Admitting: Emergency Medicine

## 2022-10-08 ENCOUNTER — Ambulatory Visit (INDEPENDENT_AMBULATORY_CARE_PROVIDER_SITE_OTHER): Payer: Medicare Other

## 2022-10-08 VITALS — BP 153/62 | HR 69 | Temp 98.4°F | Resp 18

## 2022-10-08 DIAGNOSIS — W19XXXA Unspecified fall, initial encounter: Secondary | ICD-10-CM

## 2022-10-08 DIAGNOSIS — R55 Syncope and collapse: Secondary | ICD-10-CM

## 2022-10-08 DIAGNOSIS — S92344A Nondisplaced fracture of fourth metatarsal bone, right foot, initial encounter for closed fracture: Secondary | ICD-10-CM

## 2022-10-08 DIAGNOSIS — S92334A Nondisplaced fracture of third metatarsal bone, right foot, initial encounter for closed fracture: Secondary | ICD-10-CM | POA: Diagnosis not present

## 2022-10-08 DIAGNOSIS — M79671 Pain in right foot: Secondary | ICD-10-CM

## 2022-10-08 LAB — CBC WITH DIFFERENTIAL/PLATELET
Abs Immature Granulocytes: 0.04 10*3/uL (ref 0.00–0.07)
Basophils Absolute: 0.1 10*3/uL (ref 0.0–0.1)
Basophils Relative: 1 %
Eosinophils Absolute: 0.4 10*3/uL (ref 0.0–0.5)
Eosinophils Relative: 5 %
HCT: 35.7 % — ABNORMAL LOW (ref 36.0–46.0)
Hemoglobin: 11.5 g/dL — ABNORMAL LOW (ref 12.0–15.0)
Immature Granulocytes: 1 %
Lymphocytes Relative: 13 %
Lymphs Abs: 1 10*3/uL (ref 0.7–4.0)
MCH: 29 pg (ref 26.0–34.0)
MCHC: 32.2 g/dL (ref 30.0–36.0)
MCV: 90.2 fL (ref 80.0–100.0)
Monocytes Absolute: 0.8 10*3/uL (ref 0.1–1.0)
Monocytes Relative: 10 %
Neutro Abs: 5.7 10*3/uL (ref 1.7–7.7)
Neutrophils Relative %: 70 %
Platelets: 278 10*3/uL (ref 150–400)
RBC: 3.96 MIL/uL (ref 3.87–5.11)
RDW: 14.5 % (ref 11.5–15.5)
WBC: 8 10*3/uL (ref 4.0–10.5)
nRBC: 0 % (ref 0.0–0.2)

## 2022-10-08 LAB — BASIC METABOLIC PANEL
Anion gap: 7 (ref 5–15)
BUN: 35 mg/dL — ABNORMAL HIGH (ref 8–23)
CO2: 24 mmol/L (ref 22–32)
Calcium: 9.2 mg/dL (ref 8.9–10.3)
Chloride: 107 mmol/L (ref 98–111)
Creatinine, Ser: 1.44 mg/dL — ABNORMAL HIGH (ref 0.44–1.00)
GFR, Estimated: 38 mL/min — ABNORMAL LOW (ref >60)
Glucose, Bld: 114 mg/dL — ABNORMAL HIGH (ref 70–99)
Potassium: 4.1 mmol/L (ref 3.5–5.1)
Sodium: 138 mmol/L (ref 135–145)

## 2022-10-08 NOTE — ED Provider Notes (Signed)
HPI  SUBJECTIVE:  Lindsay Glenn is a 76 y.o. female who presents with right foot pain, swelling, difficulty bearing weight after having a syncopal episode 2 days ago.  Patient states that she took an extra diuretic that day, and subsequently urinated a lot .  She states that she was trying to get out of a chair, felt faint, and had a syncopal episode with full loss of consciousness.  States that she woke up on the floor.  She had prodromal symptoms of lightheadedness and feeling faint.  No tinnitus, nausea, tunnel vision, chest pain, pressure, heaviness, palpitations, abdominal, back pain.  She checked her sugar when she came to, and it was 127.  She denies hitting her head, headache, neck pain, numbness or tingling, weakness in her extremities.  She has not had any further episodes of syncope since.  Only other change in her medications recently is that she discontinued Lexapro.  She denies change in her baseline cough, shortness of breath.  No pleuritic chest pain, hemoptysis, calf pain or swelling.  No surgery in the past 4 weeks.  She has tried rest, Tylenol, elevation, icing her foot without improvement in her symptoms.  Symptoms are worse when she tries to bear weight.  She has a past medical history of congestive heart failure, aortic stenosis, hypercholesterolemia, CVA with no residual deficits, syncope secondary to hypotension due to blood pressure medication, chronic kidney disease, Hashimoto's thyroiditis, diabetes type 1, hypertension, osteopenia, breast cancer in 2000.  She has been cancer free for 23 years.  She is on a baby aspirin 81 mg daily.  No other anticoagulant or antiplatelet use.  No history of PE, DVT, exogenous estrogen use, MI, arrhythmia, atrial fibrillation.  PCP: [facility].  Cardiology: [facility]    Past Medical History:  Diagnosis Date   (HFpEF) heart failure with preserved ejection fraction ([place])    a.) TTE 02/07/2020: EF 55%. b.) TTE 08/26/2021: EF >55%;  mild BAE   Aortic stenosis    a.) TTE 02/07/2020: EF 55%; trivial MR; mild AS with MPG 8 mmHg. b.) TTE 08/26/2021: EF >55%; mild MR/TR/PR; mild BAE; mild AS with MPG 21 mmHg.   Arthritis    BILATERAL rotator cuff tendinitis    CHF (congestive heart failure) (HCC)    CKD (chronic kidney disease), stage III (HCC)    Complication of anesthesia    a.) (+) aspiration even during colonoscopy   CTS (carpal tunnel syndrome)    Depression    DOE (dyspnea on exertion)    Dyspnea    Gastroparesis    GERD (gastroesophageal reflux disease)    Hashimoto's thyroiditis    Heart murmur    History of hiatal hernia    HLD (hyperlipidemia)    Hypertension    IDA (iron deficiency anemia)    Insomnia    LADA (latent autoimmune diabetes in adults), managed as type 1 ([place])    a.) uses insulin pump   Obesity    Osteopenia    Osteoporosis    Pancreatic cyst    Pneumonia    PONV (postoperative nausea and vomiting)    Pseudophakia of both eyes    Renal cyst, right    Restless leg syndrome    Sleep apnea    a.) unable to tolerate nocturnal PAP therapy   Stroke ([place]) 2017   field of vision lower on right eye   TIA (transient ischemic attack) 2017    Past Surgical History:  Procedure Laterality Date  ABDOMINAL HYSTERECTOMY  1994   ANKLE FRACTURE SURGERY Right 2004   screws;plate   BRACHIOPLASTY Bilateral 2018   fat removal from both upper arms   BREAST SURGERY  2000   bilateral mastectomy   CARPAL TUNNEL RELEASE Right 2004   CARPAL TUNNEL RELEASE Right 04/04/2020   Procedure: OPEN CARPAL TUNNEL RELEASE;  Surgeon: Corky Mull, MD;  Location: [facility];  Service: Orthopedics;  Laterality: Right;   COLONOSCOPY     JOINT REPLACEMENT Bilateral    knee   SHOULDER ARTHROSCOPY Right 2016   TOTAL SHOULDER ARTHROPLASTY Right 09/05/2021   Procedure: TOTAL SHOULDER ARTHROPLASTY;  Surgeon: Corky Mull, MD;  Location: [facility];  Service: Orthopedics;  Laterality: Right;   UPPER ESOPHAGEAL ENDOSCOPIC  ULTRASOUND (EUS)  2020   pancreas    Family History  Problem Relation Age of Onset   Alzheimer's disease Mother    Cancer Father     Social History   Tobacco Use   Smoking status: Never   Smokeless tobacco: Never  Vaping Use   Vaping Use: Never used  Substance Use Topics   Alcohol use: Yes    Comment: rarely   Drug use: Never    No current facility-administered medications for this encounter.  Current Outpatient Medications:    amLODipine (NORVASC) 10 MG tablet, Take 10 mg by mouth daily., Disp: , Rfl:    aspirin EC 325 MG tablet, Take 1 tablet (325 mg total) by mouth daily., Disp: 30 tablet, Rfl: 0   b complex vitamins tablet, Take 1 tablet by mouth daily., Disp: , Rfl:    bumetanide (BUMEX) 0.5 MG tablet, Take 1 tablet (0.5 mg total) by mouth daily., Disp: 30 tablet, Rfl: 0   carbamazepine (TEGRETOL XR) 100 MG 12 hr tablet, Take 100 mg by mouth 2 (two) times daily. (Patient not taking: Reported on 11/13/2021), Disp: , Rfl:    cephALEXin (KEFLEX) 500 MG capsule, Take 500 mg by mouth as needed. For dental appointments, Disp: , Rfl:    Cholecalciferol (VITAMIN D) 50 MCG (2000 UT) tablet, Take 2,000 Units by mouth daily., Disp: , Rfl:    escitalopram (LEXAPRO) 10 MG tablet, Take 10 mg by mouth daily., Disp: , Rfl:    hydrALAZINE (APRESOLINE) 10 MG tablet, Take 5 mg by mouth as needed., Disp: , Rfl:    insulin aspart (NOVOLOG) 100 UNIT/ML injection, Inject 70-80 Units into the skin daily. Via OmniPod Insulin pump, Disp: , Rfl:    Insulin Human (INSULIN PUMP) SOLN, Inject into the skin. Novolog, Disp: , Rfl:    losartan (COZAAR) 50 MG tablet, Take 50 mg by mouth at bedtime., Disp: , Rfl:    metoprolol succinate (TOPROL-XL) 25 MG 24 hr tablet, Take 12.5 mg by mouth daily. (Patient not taking: Reported on 11/13/2021), Disp: , Rfl:    Multiple Vitamins-Minerals (MULTIVITAMIN WITH MINERALS) tablet, Take 1 tablet by mouth daily., Disp: , Rfl:    naproxen sodium (ALEVE) 220 MG tablet,  Take 220 mg by mouth as needed., Disp: , Rfl:    omeprazole (PRILOSEC) 20 MG capsule, Take 20 mg by mouth as needed., Disp: , Rfl:    pravastatin (PRAVACHOL) 80 MG tablet, Take 80 mg by mouth at bedtime., Disp: , Rfl:    rOPINIRole (REQUIP) 2 MG tablet, Take 2 mg by mouth at bedtime., Disp: , Rfl:   Allergies  Allergen Reactions   Empagliflozin Nausea And Vomiting and Other (See Comments)    Decreased energy ([place])   Tape Other (  See Comments)    Causes rash, blistering( if on for several days)   Paper tape and tegaderm  is OK   Atorvastatin Other (See Comments)    Increases blood sugar   Furosemide Itching and Rash   Lisinopril Cough   Rosuvastatin Other (See Comments)    Increases blood sugar   Trulicity [Dulaglutide] Diarrhea and Nausea And Vomiting     ROS  As noted in HPI.   Physical Exam  BP (!) 153/62 (BP Location: Left Arm)   Pulse 69   Temp 98.4 F (36.9 C) (Oral)   Resp 18   SpO2 100%   Constitutional: Well developed, well nourished, no acute distress Eyes: PERRL, EOMI, conjunctiva normal bilaterally HENT: Normocephalic, atraumatic,mucus membranes moist Respiratory: Clear to auscultation bilaterally, no rales, no wheezing, no rhonchi Cardiovascular: Normal rate and rhythm, murmur loudest at the aortic valve, no gallops, no rubs GI: Nondistended skin: No rash, skin intact Musculoskeletal: Positive right foot edema.  no deformities, bruising.  Tenderness over the third and fourth metatarsals.  No ankle tenderness, midfoot tenderness, Achilles or calcaneal tenderness.  No tenderness over the fifth metatarsal.  Patient able to move all toes.  Cap refill less than 2 seconds in all toes.  No tenderness over the the foot.  No pain with dorsiflexion/plantarflexion, inversion, eversion. Neurologic: Alert & oriented x 3, CN III-XII intact, nose, heel shin within normal limits.  Patient has difficulty bearing weight.  Gait does not appear to be ataxic.  Speech fluent.   Sensation upper, lower extremities equal and grossly intact.  GCS 15. Psychiatric: Speech and behavior appropriate   ED Course   Medications - No data to display  Orders Placed This Encounter  Procedures   DG Foot Complete Right    Standing Status:   Standing    Number of Occurrences:   1    Order Specific Question:   Reason for Exam (SYMPTOM  OR DIAGNOSIS REQUIRED)    Answer:   Pain, swelling along the third and fourth metatarsals rule out fracture.   CBC with Differential    Standing Status:   Standing    Number of Occurrences:   1   Basic metabolic panel    Standing Status:   Standing    Number of Occurrences:   1   ED EKG    Syncope 2 days ago    Standing Status:   Standing    Number of Occurrences:   1    Order Specific Question:   Reason for Exam    Answer:   Syncope   EKG 12-Lead    Standing Status:   Standing    Number of Occurrences:   1   No results found for this or any previous visit (from the past 24 hour(s)).  No results found.  ED Clinical Impression  1. Closed nondisplaced fracture of fourth metatarsal bone of right foot, initial encounter   2. Nondisplaced fracture of third metatarsal bone, right foot, initial encounter for closed fracture   3. Syncope, unspecified syncope type      ED Assessment/Plan    EKG: Normal sinus rhythm, rate 70.  Left axis deviation.  No ST T wave changes.  T wave inversion in 3, aVF resolved compared to EKG from 10/22.  Patient with a syncopal episode 2 days ago, however, I suspect it was due to dehydration.  We have a clear reason for the syncopal episode.  Doubt MI, stroke, arrhythmia, PE causing the syncope.  She  does not meet the [place] or [place] head CT criteria to defer CT due to age, however, patient is neurologically intact, appears stable. She denies hitting her head, headache, she has no evidence of facial or intracranial injury, do not think that she needs a head CT.  Checking a CBC, BMP.  I suspect that she has  broken her foot.  Obtaining foot x-ray.  Labs reviewed.  CBC shows anemia, but she is at her baseline.  Creatinine elevated, but improved from lab work 11 months ago.  Reviewed imaging independently.  Appearance suspicious for minimally displaced fracture of the third and fourth metatarsal distal metadiaphysis.  see radiology report for full details.  Patient with third, fourth minimally displaced metatarsal fractures.  She is neurovascularly intact.  Placed in Ace wrap, postop shoe.  Tylenol 3 times a day with sparing use of NSAIDs.  Follow-up with orthopedics or podiatry as needed.  Follow-up with PCP or cardiology regarding syncopal episode.  ER return precautions given..  Discussed labs, imaging, MDM, treatment plan, and plan for follow-up with patient. Discussed sn/sx that should prompt return to the ED. patient agrees with plan.   No orders of the defined types were placed in this encounter.     *This clinic note was created using Dragon dictation software. Therefore, there may be occasional mistakes despite careful proofreading. ?    Melynda Ripple, MD 10/11/22 [phone]

## 2022-10-08 NOTE — ED Triage Notes (Signed)
Pt present states she fainted on Monday and possibly broke her right foot. Pt right foot is swollen. Pt states hurts to apply pressure to foot.

## 2022-10-08 NOTE — Discharge Instructions (Signed)
Your labs are at their baseline.  You have a subtle nondisplaced fracture of your third and fourth metatarsals, which is where you are tender.  You can take Tylenol 1000 mg 3 times a day as needed for pain.  Ice, elevate.  Ace wrap, stiff soled shoe should help.  Please follow-up with [facility] if this continues to give you pain in a week to 10 days.  Go immediately to the emergency department for chest pain, shortness of breath, palpitations, or if you pass out again.

## 2022-11-09 DIAGNOSIS — J205 Acute bronchitis due to respiratory syncytial virus: Secondary | ICD-10-CM | POA: Insufficient documentation

## 2022-11-09 DIAGNOSIS — R0602 Shortness of breath: Secondary | ICD-10-CM | POA: Diagnosis present

## 2022-11-09 DIAGNOSIS — U071 COVID-19: Secondary | ICD-10-CM | POA: Insufficient documentation

## 2022-11-09 DIAGNOSIS — I509 Heart failure, unspecified: Secondary | ICD-10-CM | POA: Insufficient documentation

## 2022-11-09 NOTE — ED Triage Notes (Signed)
Pt via POV c/o new onset of SOB today s/t dry non-productive cough that has been ongoing x2 days. Was recently seen at urgent care negative workup and clear chest xray. Pt denies CP. Pt endorse hx CHF and HTN, Has been noncompliant with medication d/t not feeling well. Pt sates daughter who lives with her has RSV.

## 2022-11-10 ENCOUNTER — Other Ambulatory Visit: Payer: Self-pay

## 2022-11-10 ENCOUNTER — Emergency Department
Admission: EM | Admit: 2022-11-10 | Discharge: 2022-11-10 | Disposition: A | Discharge status: Home / Self Care | Payer: Medicare Other | Attending: Emergency Medicine | Admitting: Emergency Medicine

## 2022-11-10 ENCOUNTER — Emergency Department: Payer: Medicare Other

## 2022-11-10 DIAGNOSIS — U071 COVID-19: Secondary | ICD-10-CM

## 2022-11-10 DIAGNOSIS — R051 Acute cough: Secondary | ICD-10-CM

## 2022-11-10 DIAGNOSIS — J208 Acute bronchitis due to other specified organisms: Secondary | ICD-10-CM

## 2022-11-10 LAB — MAGNESIUM: Magnesium: 2.1 mg/dL (ref 1.7–2.4)

## 2022-11-10 LAB — CBC WITH DIFFERENTIAL/PLATELET
Abs Immature Granulocytes: 0.02 10*3/uL (ref 0.00–0.07)
Basophils Absolute: 0.1 10*3/uL (ref 0.0–0.1)
Basophils Relative: 1 %
Eosinophils Absolute: 0.2 10*3/uL (ref 0.0–0.5)
Eosinophils Relative: 5 %
HCT: 35.8 % — ABNORMAL LOW (ref 36.0–46.0)
Hemoglobin: 11.2 g/dL — ABNORMAL LOW (ref 12.0–15.0)
Immature Granulocytes: 0 %
Lymphocytes Relative: 13 %
Lymphs Abs: 0.7 10*3/uL (ref 0.7–4.0)
MCH: 28.6 pg (ref 26.0–34.0)
MCHC: 31.3 g/dL (ref 30.0–36.0)
MCV: 91.3 fL (ref 80.0–100.0)
Monocytes Absolute: 0.9 10*3/uL (ref 0.1–1.0)
Monocytes Relative: 17 %
Neutro Abs: 3.4 10*3/uL (ref 1.7–7.7)
Neutrophils Relative %: 64 %
Platelets: 238 10*3/uL (ref 150–400)
RBC: 3.92 MIL/uL (ref 3.87–5.11)
RDW: 14.4 % (ref 11.5–15.5)
WBC: 5.3 10*3/uL (ref 4.0–10.5)
nRBC: 0 % (ref 0.0–0.2)

## 2022-11-10 LAB — RESP PANEL BY RT-PCR (RSV, FLU A&B, COVID)  RVPGX2
Influenza A by PCR: NEGATIVE
Influenza B by PCR: NEGATIVE
Resp Syncytial Virus by PCR: POSITIVE — AB
SARS Coronavirus 2 by RT PCR: POSITIVE — AB

## 2022-11-10 LAB — COMPREHENSIVE METABOLIC PANEL
ALT: 19 U/L (ref 0–44)
AST: 20 U/L (ref 15–41)
Albumin: 4.1 g/dL (ref 3.5–5.0)
Alkaline Phosphatase: 135 U/L — ABNORMAL HIGH (ref 38–126)
Anion gap: 7 (ref 5–15)
BUN: 26 mg/dL — ABNORMAL HIGH (ref 8–23)
CO2: 25 mmol/L (ref 22–32)
Calcium: 9.1 mg/dL (ref 8.9–10.3)
Chloride: 108 mmol/L (ref 98–111)
Creatinine, Ser: 1.45 mg/dL — ABNORMAL HIGH (ref 0.44–1.00)
GFR, Estimated: 37 mL/min — ABNORMAL LOW (ref >60)
Glucose, Bld: 132 mg/dL — ABNORMAL HIGH (ref 70–99)
Potassium: 4.1 mmol/L (ref 3.5–5.1)
Sodium: 140 mmol/L (ref 135–145)
Total Bilirubin: 0.7 mg/dL (ref 0.3–1.2)
Total Protein: 7.1 g/dL (ref 6.5–8.1)

## 2022-11-10 LAB — TROPONIN I (HIGH SENSITIVITY)
Troponin I (High Sensitivity): 30 ng/L — ABNORMAL HIGH (ref <18)
Troponin I (High Sensitivity): 29 ng/L — ABNORMAL HIGH (ref <18)

## 2022-11-10 LAB — BRAIN NATRIURETIC PEPTIDE: B Natriuretic Peptide: 186.9 pg/mL — ABNORMAL HIGH (ref 0.0–100.0)

## 2022-11-10 MED ORDER — NIRMATRELVIR/RITONAVIR (PAXLOVID) TABLET (RENAL DOSING)
2.0000 | ORAL_TABLET | Freq: Two times a day (BID) | ORAL | Status: DC
  Administered 2022-11-10: 2 via ORAL
  Filled 2022-11-10: qty 20

## 2022-11-10 NOTE — Discharge Instructions (Addendum)
You tested positive for both COVID-19 and RSV today.  These are 2 different viruses that are causing her cough and contributing to a bronchitis.  You are being discharged with a prescription for Paxlovid, this is an antiviral medicine that helps particularly with COVID-19.  Use Tylenol for pain and fevers.  Up to 1000 mg per dose, up to 4 times per day.  Do not take more than 4000 mg of Tylenol/acetaminophen within 24 hours..  Continue all of your other medications.  Is importantly restart your Bumex.  For the first dose back, you can take a double dose because you are a little bit behind, then resume normal dosing after this.

## 2022-11-10 NOTE — ED Provider Notes (Signed)
[facility] Provider Note    None    (approximate)   History   Shortness of Breath   HPI  Lindsay Glenn is a 76 y.o. female who presents to the ED for evaluation of Shortness of Breath   Patient presents to the ED for evaluation of a nonproductive cough.  She reports getting to coughing fits causing her to feel short of breath.  Cough has been there for the past couple days and the dyspnea is more recent in the past few hours associated with worsening cough.  Has a history of CHF and has not taken her Bumex in the past couple days because she has not been feeling well.  Denies any chest pain, syncope, abdominal pain or emesis   Physical Exam   Triage Vital Signs: ED Triage Vitals  Enc Vitals Group     BP 11/09/22 2352 (!) 200/70     Pulse Rate 11/09/22 2352 72     Resp 11/09/22 2352 16     Temp 11/09/22 2352 98.5 F (36.9 C)     Temp Source 11/09/22 2352 Oral     SpO2 11/09/22 2352 96 %     Weight 11/10/22 0000 188 lb (85.3 kg)     Height 11/10/22 0000 5' (1.524 m)     Head Circumference --      Peak Flow --      Pain Score 11/10/22 0000 0     Pain Loc --      Pain Edu? --      Excl. in [place]? --     Most recent vital signs: Vitals:   11/10/22 0525 11/10/22 0555  BP: (!) 171/74 (!) 144/61  Pulse: 66 74  Resp: 18 18  Temp: 98.4 F (36.9 C) 98.5 F (36.9 C)  SpO2: 97% 97%    General: Awake, no distress.  Occasional nonproductive coughing during my evaluation, but conversational in full sentences. CV:  Good peripheral perfusion.  RRR without appreciable murmur Resp:  Normal effort.  No tachypnea.  Clear lungs.  No wheezing. Abd:  No distention.  Soft and benign 9 MSK:  No deformity noted.  Trace pitting edema to bilateral lower extremities symmetrically Neuro:  No focal deficits appreciated. Other:     ED Results / Procedures / Treatments   Labs (all labs ordered are listed, but only abnormal results are displayed) Labs Reviewed   RESP PANEL BY RT-PCR (RSV, FLU A&B, COVID)  RVPGX2 - Abnormal; Notable for the following components:      Result Value   SARS Coronavirus 2 by RT PCR POSITIVE (*)    Resp Syncytial Virus by PCR POSITIVE (*)    All other components within normal limits  COMPREHENSIVE METABOLIC PANEL - Abnormal; Notable for the following components:   Glucose, Bld 132 (*)    BUN 26 (*)    Creatinine, Ser 1.45 (*)    Alkaline Phosphatase 135 (*)    GFR, Estimated 37 (*)    All other components within normal limits  CBC WITH DIFFERENTIAL/PLATELET - Abnormal; Notable for the following components:   Hemoglobin 11.2 (*)    HCT 35.8 (*)    All other components within normal limits  BRAIN NATRIURETIC PEPTIDE - Abnormal; Notable for the following components:   B Natriuretic Peptide 186.9 (*)    All other components within normal limits  TROPONIN I (HIGH SENSITIVITY) - Abnormal; Notable for the following components:   Troponin I (High Sensitivity) 30 (*)  All other components within normal limits  TROPONIN I (HIGH SENSITIVITY) - Abnormal; Notable for the following components:   Troponin I (High Sensitivity) 29 (*)    All other components within normal limits  MAGNESIUM  URINALYSIS, ROUTINE W REFLEX MICROSCOPIC    EKG Sinus rhythm with a rate of 70 bpm.  Normal axis and intervals.  Nonspecific ST changes inferiorly and laterally without STEMI.  RADIOLOGY CXR interpreted by me without evidence of acute cardiopulmonary pathology.  Official radiology report(s): DG Chest 2 View  Result Date: 11/10/2022 CLINICAL DATA:  Cough EXAM: CHEST - 2 VIEW COMPARISON:  11/05/2021 FINDINGS: Stable mild cardiomegaly. Aortic atherosclerotic calcification. Chronic bronchitic change. No focal consolidation, pleural effusion, or pneumothorax. No acute osseous abnormality. Chronic wedging of a few mid and lower thoracic vertebral bodies. IMPRESSION: No change from 11/05/2021. Chronic bronchitic thickening. Mild cardiomegaly.  Electronically Signed   By: Placido Sou M.D.   On: 11/10/2022 00:28    PROCEDURES and INTERVENTIONS:  .1-3 Lead EKG Interpretation  Performed by: Vladimir Crofts, MD Authorized by: Vladimir Crofts, MD     Interpretation: normal     ECG rate:  70   ECG rate assessment: normal     Rhythm: sinus rhythm     Ectopy: none     Conduction: normal     Medications  nirmatrelvir/ritonavir (renal dosing) (PAXLOVID) 2 tablet (2 tablets Oral Given 11/10/22 0555)     IMPRESSION / MDM / ASSESSMENT AND PLAN / ED COURSE  I reviewed the triage vital signs and the nursing notes.  Differential diagnosis includes, but is not limited to, pneumonia, sepsis, pneumothorax, viral syndrome, CHF exacerbation, COPD exacerbation.  {Patient presents with symptoms of an acute illness or injury that is potentially life-threatening.  76 year old woman presents with a couple days of a cough, clinically a bronchitis, testing positive for both RSV and COVID-19 and ultimately suitable for trial of outpatient management.  Look systemically well to me, just occasional coughing but speaking in full sentences between this.  Normal sats, not tachypneic.  No signs of sepsis.  Blood work without leukocytosis.  Metabolic panel with CKD around baseline.  BNP is noted to be slightly elevated, but she has no pleural effusions or pulmonary edema on CXR.  Troponins are flat on her repeat and she has no chest pain.  I considered observation admission for this patient, but she looks well and is eager to go home for the holidays which is reasonable.  We discussed starting Paxlovid and a trial of outpatient management.  Discussed OTC medications to help and return precautions for the ED.  Clinical Course as of 11/10/22 0610  Mon Nov 10, 2022  0528 Reassessed and discussed plan of care.  She is in agreement. [DS]  [phone] Called pharmacy to send the renal dosing variant of Paxlovid [DS]    Clinical Course User Index [DS] Vladimir Crofts, MD      FINAL CLINICAL IMPRESSION(S) / ED DIAGNOSES   Final diagnoses:  COVID-19  Acute cough  Acute bronchitis due to COVID-19 virus     Rx / DC Orders   ED Discharge Orders     None        Note:  This document was prepared using Dragon voice recognition software and may include unintentional dictation errors.   Vladimir Crofts, MD 11/10/22 [phone]

## 2022-11-10 NOTE — ED Provider Triage Note (Signed)
Emergency Medicine Provider Triage Evaluation Note  Lindsay Glenn , a 76 y.o. female  was evaluated in triage.  Pt complains of nonproductive cough for couple days, think she has bronchitis.  Does acknowledge increased swelling and noncompliance with her diuretics because she has not been feeling well.  Review of Systems  Positive:  Negative:   Physical Exam  BP (!) 189/58 (BP Location: Left Arm)   Pulse 72   Temp 98.5 F (36.9 C) (Oral)   Resp 16   SpO2 96%  Gen:   Awake, no distress   Resp:  Normal effort  MSK:   Moves extremities without difficulty  Other:    Medical Decision Making  Medically screening exam initiated at 12:00 AM.  Appropriate orders placed.  Lindsay Glenn was informed that the remainder of the evaluation will be completed by another provider, this initial triage assessment does not replace that evaluation, and the importance of remaining in the ED until their evaluation is complete.     Vladimir Crofts, MD 11/10/22 0000

## 2022-11-10 NOTE — ED Notes (Signed)
Pt Dc to home. Dc instructions reviewed with all questions answered. Pt voices understanding. Pt remaining in lobby until someone can pick her up.

## 2022-11-21 ENCOUNTER — Emergency Department: Payer: Medicare Other

## 2022-11-21 ENCOUNTER — Observation Stay: Payer: Medicare Other

## 2022-11-21 ENCOUNTER — Observation Stay
Admission: EM | Admit: 2022-11-21 | Discharge: 2022-11-22 | Disposition: A | Discharge status: Home / Self Care | Payer: Medicare Other | Attending: Student | Admitting: Student

## 2022-11-21 ENCOUNTER — Other Ambulatory Visit: Payer: Self-pay

## 2022-11-21 DIAGNOSIS — Z79899 Other long term (current) drug therapy: Secondary | ICD-10-CM | POA: Insufficient documentation

## 2022-11-21 DIAGNOSIS — R058 Other specified cough: Secondary | ICD-10-CM

## 2022-11-21 DIAGNOSIS — I639 Cerebral infarction, unspecified: Principal | ICD-10-CM | POA: Diagnosis present

## 2022-11-21 DIAGNOSIS — Z96653 Presence of artificial knee joint, bilateral: Secondary | ICD-10-CM | POA: Diagnosis not present

## 2022-11-21 DIAGNOSIS — R059 Cough, unspecified: Secondary | ICD-10-CM | POA: Diagnosis not present

## 2022-11-21 DIAGNOSIS — Z96611 Presence of right artificial shoulder joint: Secondary | ICD-10-CM | POA: Diagnosis not present

## 2022-11-21 DIAGNOSIS — Z8673 Personal history of transient ischemic attack (TIA), and cerebral infarction without residual deficits: Secondary | ICD-10-CM | POA: Insufficient documentation

## 2022-11-21 DIAGNOSIS — M810 Age-related osteoporosis without current pathological fracture: Secondary | ICD-10-CM | POA: Insufficient documentation

## 2022-11-21 DIAGNOSIS — Z794 Long term (current) use of insulin: Secondary | ICD-10-CM | POA: Diagnosis not present

## 2022-11-21 DIAGNOSIS — Z7982 Long term (current) use of aspirin: Secondary | ICD-10-CM | POA: Diagnosis not present

## 2022-11-21 DIAGNOSIS — I130 Hypertensive heart and chronic kidney disease with heart failure and stage 1 through stage 4 chronic kidney disease, or unspecified chronic kidney disease: Secondary | ICD-10-CM | POA: Diagnosis not present

## 2022-11-21 DIAGNOSIS — N1832 Chronic kidney disease, stage 3b: Secondary | ICD-10-CM | POA: Diagnosis not present

## 2022-11-21 DIAGNOSIS — I5032 Chronic diastolic (congestive) heart failure: Secondary | ICD-10-CM | POA: Diagnosis present

## 2022-11-21 DIAGNOSIS — H538 Other visual disturbances: Secondary | ICD-10-CM | POA: Diagnosis present

## 2022-11-21 DIAGNOSIS — E1322 Other specified diabetes mellitus with diabetic chronic kidney disease: Secondary | ICD-10-CM | POA: Diagnosis present

## 2022-11-21 DIAGNOSIS — N189 Chronic kidney disease, unspecified: Secondary | ICD-10-CM | POA: Insufficient documentation

## 2022-11-21 LAB — CBC
HCT: 37.8 % (ref 36.0–46.0)
Hemoglobin: 12.1 g/dL (ref 12.0–15.0)
MCH: 28.7 pg (ref 26.0–34.0)
MCHC: 32 g/dL (ref 30.0–36.0)
MCV: 89.8 fL (ref 80.0–100.0)
Platelets: 304 10*3/uL (ref 150–400)
RBC: 4.21 MIL/uL (ref 3.87–5.11)
RDW: 14.2 % (ref 11.5–15.5)
WBC: 8 10*3/uL (ref 4.0–10.5)
nRBC: 0 % (ref 0.0–0.2)

## 2022-11-21 LAB — BASIC METABOLIC PANEL
Anion gap: 11 (ref 5–15)
BUN: 32 mg/dL — ABNORMAL HIGH (ref 8–23)
CO2: 24 mmol/L (ref 22–32)
Calcium: 9.5 mg/dL (ref 8.9–10.3)
Chloride: 103 mmol/L (ref 98–111)
Creatinine, Ser: 1.34 mg/dL — ABNORMAL HIGH (ref 0.44–1.00)
GFR, Estimated: 41 mL/min — ABNORMAL LOW (ref >60)
Glucose, Bld: 276 mg/dL — ABNORMAL HIGH (ref 70–99)
Potassium: 4.5 mmol/L (ref 3.5–5.1)
Sodium: 138 mmol/L (ref 135–145)

## 2022-11-21 LAB — URINALYSIS, ROUTINE W REFLEX MICROSCOPIC
Bacteria, UA: NONE SEEN
Bilirubin Urine: NEGATIVE
Glucose, UA: NEGATIVE mg/dL
Ketones, ur: NEGATIVE mg/dL
Leukocytes,Ua: NEGATIVE
Nitrite: NEGATIVE
Protein, ur: NEGATIVE mg/dL
Specific Gravity, Urine: 1.005 (ref 1.005–1.030)
pH: 5 (ref 5.0–8.0)

## 2022-11-21 LAB — LIPID PANEL
Cholesterol: 240 mg/dL — ABNORMAL HIGH (ref 0–200)
HDL: 39 mg/dL — ABNORMAL LOW (ref >40)
LDL Cholesterol: 167 mg/dL — ABNORMAL HIGH (ref 0–99)
Total CHOL/HDL Ratio: 6.2 RATIO
Triglycerides: 168 mg/dL — ABNORMAL HIGH (ref <150)
VLDL: 34 mg/dL (ref 0–40)

## 2022-11-21 LAB — CBG MONITORING, ED: Glucose-Capillary: 172 mg/dL — ABNORMAL HIGH (ref 70–99)

## 2022-11-21 LAB — GLUCOSE, CAPILLARY: Glucose-Capillary: 278 mg/dL — ABNORMAL HIGH (ref 70–99)

## 2022-11-21 LAB — BRAIN NATRIURETIC PEPTIDE: B Natriuretic Peptide: 201.6 pg/mL — ABNORMAL HIGH (ref 0.0–100.0)

## 2022-11-21 LAB — TROPONIN I (HIGH SENSITIVITY): Troponin I (High Sensitivity): 16 ng/L (ref <18)

## 2022-11-21 MED ORDER — SODIUM CHLORIDE 0.9 % IV BOLUS
1000.0000 mL | Freq: Once | INTRAVENOUS | Status: AC
  Administered 2022-11-21: 1000 mL via INTRAVENOUS

## 2022-11-21 MED ORDER — ROPINIROLE HCL 1 MG PO TABS
4.0000 mg | ORAL_TABLET | Freq: Every day | ORAL | Status: DC
  Administered 2022-11-22: 4 mg via ORAL
  Filled 2022-11-21 (×2): qty 4

## 2022-11-21 MED ORDER — BUMETANIDE 1 MG PO TABS
1.0000 mg | ORAL_TABLET | Freq: Every day | ORAL | Status: DC
  Administered 2022-11-22: 1 mg via ORAL
  Filled 2022-11-21 (×2): qty 1

## 2022-11-21 MED ORDER — ASPIRIN 81 MG PO CHEW
324.0000 mg | CHEWABLE_TABLET | Freq: Once | ORAL | Status: AC
  Administered 2022-11-21: 324 mg via ORAL
  Filled 2022-11-21: qty 4

## 2022-11-21 MED ORDER — DULOXETINE HCL 30 MG PO CPEP
30.0000 mg | ORAL_CAPSULE | Freq: Every day | ORAL | Status: DC
  Administered 2022-11-22: 30 mg via ORAL
  Filled 2022-11-21: qty 1

## 2022-11-21 MED ORDER — IOHEXOL 350 MG/ML SOLN
75.0000 mL | Freq: Once | INTRAVENOUS | Status: AC | PRN
  Administered 2022-11-21: 75 mL via INTRAVENOUS

## 2022-11-21 MED ORDER — ACETAMINOPHEN 160 MG/5ML PO SOLN: 650.0000 mg | ORAL | Status: DC | PRN

## 2022-11-21 MED ORDER — GADOBUTROL 1 MMOL/ML IV SOLN
7.5000 mL | Freq: Once | INTRAVENOUS | Status: AC | PRN
  Administered 2022-11-21: 7.5 mL via INTRAVENOUS

## 2022-11-21 MED ORDER — ENOXAPARIN SODIUM 60 MG/0.6ML IJ SOSY
0.5000 mg/kg | PREFILLED_SYRINGE | INTRAMUSCULAR | Status: DC
  Administered 2022-11-22: 42.5 mg via SUBCUTANEOUS
  Filled 2022-11-21: qty 0.6

## 2022-11-21 MED ORDER — INSULIN GLARGINE-YFGN 100 UNIT/ML ~~LOC~~ SOLN
20.0000 [IU] | Freq: Every day | SUBCUTANEOUS | Status: DC
  Administered 2022-11-22: 20 [IU] via SUBCUTANEOUS
  Filled 2022-11-21 (×2): qty 0.2

## 2022-11-21 MED ORDER — ESCITALOPRAM OXALATE 10 MG PO TABS
20.0000 mg | ORAL_TABLET | Freq: Every day | ORAL | Status: DC
  Filled 2022-11-21: qty 2

## 2022-11-21 MED ORDER — ACETAMINOPHEN 650 MG RE SUPP: 650.0000 mg | RECTAL | Status: DC | PRN

## 2022-11-21 MED ORDER — ROPINIROLE HCL 1 MG PO TABS
2.0000 mg | ORAL_TABLET | Freq: Every day | ORAL | Status: DC
  Filled 2022-11-21: qty 2

## 2022-11-21 MED ORDER — INSULIN ASPART 100 UNIT/ML IJ SOLN
0.0000 [IU] | Freq: Three times a day (TID) | INTRAMUSCULAR | Status: DC
  Administered 2022-11-21: 3 [IU] via SUBCUTANEOUS
  Administered 2022-11-22: 8 [IU] via SUBCUTANEOUS
  Administered 2022-11-22 (×2): 5 [IU] via SUBCUTANEOUS
  Filled 2022-11-21 (×4): qty 1

## 2022-11-21 MED ORDER — ROSUVASTATIN CALCIUM 20 MG PO TABS
20.0000 mg | ORAL_TABLET | Freq: Every day | ORAL | Status: DC
  Administered 2022-11-22: 20 mg via ORAL
  Filled 2022-11-21 (×3): qty 1

## 2022-11-21 MED ORDER — IPRATROPIUM-ALBUTEROL 0.5-2.5 (3) MG/3ML IN SOLN
3.0000 mL | Freq: Once | RESPIRATORY_TRACT | Status: AC
  Administered 2022-11-21: 3 mL via RESPIRATORY_TRACT
  Filled 2022-11-21: qty 3

## 2022-11-21 MED ORDER — ACETAMINOPHEN 325 MG PO TABS
650.0000 mg | ORAL_TABLET | ORAL | Status: DC | PRN
  Administered 2022-11-21 – 2022-11-22 (×2): 650 mg via ORAL
  Filled 2022-11-21 (×2): qty 2

## 2022-11-21 MED ORDER — ASPIRIN 81 MG PO TBEC
81.0000 mg | DELAYED_RELEASE_TABLET | Freq: Every day | ORAL | Status: DC
  Administered 2022-11-22: 81 mg via ORAL
  Filled 2022-11-21: qty 1

## 2022-11-21 MED ORDER — BUMETANIDE 1 MG PO TABS: 1.0000 mg | ORAL_TABLET | Freq: Every day | ORAL | Status: DC

## 2022-11-21 MED ORDER — STROKE: EARLY STAGES OF RECOVERY BOOK: Freq: Once | Status: AC

## 2022-11-21 MED ORDER — HYDROCOD POLI-CHLORPHE POLI ER 10-8 MG/5ML PO SUER
5.0000 mL | Freq: Two times a day (BID) | ORAL | Status: DC | PRN
  Administered 2022-11-22: 5 mL via ORAL
  Filled 2022-11-21: qty 5

## 2022-11-21 MED ORDER — SENNOSIDES-DOCUSATE SODIUM 8.6-50 MG PO TABS: 1.0000 | ORAL_TABLET | Freq: Every evening | ORAL | Status: DC | PRN

## 2022-11-21 NOTE — ED Triage Notes (Addendum)
Pt to ED via [facility] from home. Pt reports since 12/22 has been having productive cough. Pt reports last night she started coughing real bad, vomited and then started seeing "patterns". Stroke screen negative with EMS. Rhonchi in all lung fields. Pt tested positive for COVID and RSV on 12/24.  EMS VS:  170/73 98% RA 86 HR CBG 262

## 2022-11-21 NOTE — Assessment & Plan Note (Addendum)
Patient states that since testing positive for COVID-19 and RSV approximately 2 weeks ago, her cough has been persistently present, however seems slightly worse in the past few days.  This may be secondary to mild pulmonary edema.  Differential also includes reactive airway disease secondary to recent infection.  No focal opacities on chest x-ray to suggest postviral bacterial pneumonia.  - Diuretics as noted below - Tussionex as needed - Continue home albuterol

## 2022-11-21 NOTE — Assessment & Plan Note (Addendum)
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  Patient is outside the window for tPA.  Previous stroke was felt to be small vessel in nature, however given recent COVID-19 infection, she is at risk for coagulopathy.  - Neurology consulted; appreciate their recommendations - Telemetry monitoring - Allow for permissive HTN (systolic < 527 and diastolic < 782) - Echocardiogram  - Lipid panel  - s/p ASA 325 mg. Resume home 81 mg daily tomorrow - Discontinue home pravastatin - Start high intensity statin - PT/OT

## 2022-11-21 NOTE — ED Provider Notes (Signed)
[facility] Provider Note    Event Date/Time   First MD Initiated Contact with Patient 11/21/22 1335     (approximate)   History   Weakness, Headache, and Cough (RSV and COVID + 12/24)   HPI  Lindsay Glenn is a 77 y.o. female with complicated past medical history including history of previous stroke, heart failure, CKD, hypertension, recent diagnosis of COVID and RSV, here with primary complaint of visual changes in the setting of ongoing shortness of breath.  Patient was just seen on 12/24 and has been persistently symptomatic with COVID and RSV.  She states been coughing very frequently.  She was seen by her PCP and just recently started on breathing treatments.  She states that last night, she took codeine cough syrup that had been prescribed and then started vomiting.  She has significant amount of coughing during this episode as well.  She states that shortly after, she developed bright, flashing, floating spots in her left eye.  This persisted intermittently and have now largely resolved.  She states she continues to have difficulty seeing out of her left lower eye.  She has a history of a stroke with similar symptoms in the right eye.  She also endorses ongoing shortness of breath and states that she has not really improved much despite ongoing outpatient treatment for symptoms.      Physical Exam   Triage Vital Signs: ED Triage Vitals  Enc Vitals Group     BP 11/21/22 1138 (!) 172/71     Pulse Rate 11/21/22 1138 86     Resp 11/21/22 1138 18     Temp 11/21/22 1138 98.4 F (36.9 C)     Temp Source 11/21/22 1138 Oral     SpO2 11/21/22 1138 98 %     Weight --      Height --      Head Circumference --      Peak Flow --      Pain Score 11/21/22 1137 10     Pain Loc --      Pain Edu? --      Excl. in [place]? --     Most recent vital signs: Vitals:   11/21/22 2038 11/21/22 2153  BP:  (!) 178/70  Pulse:  79  Resp:  17  Temp: 98.6 F (37 C) 98.2 F  (36.8 C)  SpO2:  94%     General: Awake, no distress.  CV:  Good peripheral perfusion.  Regular rate and rhythm. Resp:  Mild tachypnea, diffuse wheezes noted. Abd:  No distention.  Other:  Left lower visual field cut on the left eye.  Extraocular movements are intact.  No photophobia.  Cranial nerves II through XII intact.  Strength out of 5 bilateral upper and lower extremities.  Normal finger-nose.  Normal sensation.   ED Results / Procedures / Treatments   Labs (all labs ordered are listed, but only abnormal results are displayed) Labs Reviewed  BASIC METABOLIC PANEL - Abnormal; Notable for the following components:      Result Value   Glucose, Bld 276 (*)    BUN 32 (*)    Creatinine, Ser 1.34 (*)    GFR, Estimated 41 (*)    All other components within normal limits  URINALYSIS, ROUTINE W REFLEX MICROSCOPIC - Abnormal; Notable for the following components:   Color, Urine COLORLESS (*)    APPearance CLEAR (*)    Hgb urine dipstick SMALL (*)  All other components within normal limits  LIPID PANEL - Abnormal; Notable for the following components:   Cholesterol 240 (*)    Triglycerides 168 (*)    HDL 39 (*)    LDL Cholesterol 167 (*)    All other components within normal limits  BRAIN NATRIURETIC PEPTIDE - Abnormal; Notable for the following components:   B Natriuretic Peptide 201.6 (*)    All other components within normal limits  CBG MONITORING, ED - Abnormal; Notable for the following components:   Glucose-Capillary 172 (*)    All other components within normal limits  CBC  PROCALCITONIN  CBC WITH DIFFERENTIAL/PLATELET  BASIC METABOLIC PANEL  TROPONIN I (HIGH SENSITIVITY)     EKG Normal sinus rhythm, ventricular rate 80.  PR 182, QRS 80, QTc 454.  Significant T wave changes.  No acute ST elevations.   RADIOLOGY MR Brain: Right PCA infarct CXR: Clear   I also independently reviewed and agree with radiologist interpretations.   PROCEDURES:  Critical  Care performed: No  Procedures    MEDICATIONS ORDERED IN ED: Medications   stroke: early stages of recovery book (has no administration in time range)  acetaminophen (TYLENOL) tablet 650 mg (650 mg Oral Given 11/21/22 2025)    Or  acetaminophen (TYLENOL) 160 MG/5ML solution 650 mg ( Per Tube See Alternative 11/21/22 2025)    Or  acetaminophen (TYLENOL) suppository 650 mg ( Rectal See Alternative 11/21/22 2025)  senna-docusate (Senokot-S) tablet 1 tablet (has no administration in time range)  enoxaparin (LOVENOX) injection 42.5 mg (has no administration in time range)  insulin aspart (novoLOG) injection 0-15 Units (3 Units Subcutaneous Given 11/21/22 1732)  chlorpheniramine-HYDROcodone (TUSSIONEX) 10-8 MG/5ML suspension 5 mL (has no administration in time range)  aspirin EC tablet 81 mg (has no administration in time range)  rosuvastatin (CRESTOR) tablet 20 mg (has no administration in time range)  DULoxetine (CYMBALTA) DR capsule 30 mg (has no administration in time range)  escitalopram (LEXAPRO) tablet 20 mg (has no administration in time range)  bumetanide (BUMEX) tablet 1 mg (1 mg Oral Not Given 11/21/22 2025)  rOPINIRole (REQUIP) tablet 4 mg (has no administration in time range)  insulin glargine-yfgn (SEMGLEE) injection 20 Units (has no administration in time range)  ipratropium-albuterol (DUONEB) 0.5-2.5 (3) MG/3ML nebulizer solution 3 mL (3 mLs Nebulization Given 11/21/22 1613)  ipratropium-albuterol (DUONEB) 0.5-2.5 (3) MG/3ML nebulizer solution 3 mL (3 mLs Nebulization Given 11/21/22 1612)  sodium chloride 0.9 % bolus 1,000 mL (1,000 mLs Intravenous New Bag/Given 11/21/22 1612)  iohexol (OMNIPAQUE) 350 MG/ML injection 75 mL (75 mLs Intravenous Contrast Given 11/21/22 1532)  aspirin chewable tablet 324 mg (324 mg Oral Given 11/21/22 1613)  gadobutrol (GADAVIST) 1 MMOL/ML injection 7.5 mL (7.5 mLs Intravenous Contrast Given 11/21/22 1701)     IMPRESSION / MDM / ASSESSMENT AND PLAN / ED COURSE  I  reviewed the triage vital signs and the nursing notes.                              Differential diagnosis includes, but is not limited to, retinal hemorrhage from coughing, RD, less likely PCA CVA, mass/lesion.  Patient's presentation is most consistent with acute presentation with potential threat to life or bodily function.   The patient is on the cardiac monitor to evaluate for evidence of arrhythmia and/or significant heart rate changes.**}  77 yo F here with acute visual field changes in setting of recent COVID and  coughing spell. Initially, concern for retinal hemorrhage given positive scotomas and unilateral nature of sx. However, pt has h/o CVA so MR obtained which shows actual PCA stroke. 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 Will give ASA. Outside of tPA window. Admit to medicine. Labs o/w unremarkable. BMP with hyperglycemia, CKD. No leukocytosis on CBC.   FINAL CLINICAL IMPRESSION(S) / ED DIAGNOSES   Final diagnoses:  Acute stroke due to ischemia [facility])     Rx / DC Orders   ED Discharge Orders     None        Note:  This document was prepared using Dragon voice recognition software and may include unintentional dictation errors.   Duffy Bruce, MD 11/21/22 2217

## 2022-11-21 NOTE — H&P (Addendum)
History and Physical    Patient: Lindsay Glenn WFU:[id number] DOB: 05/07/1946 DOA: 11/21/2022 DOS: the patient was seen and examined on 11/21/2022 PCP: Jonathon Jordan, MD  Patient coming from: Home  Chief Complaint:  Chief Complaint  Patient presents with   Weakness   Headache   Cough    RSV and COVID + 12/24   HPI: Lindsay Glenn is a 77 y.o. female with medical history significant of latent autoimmune diabetes, HFpEF, CKD stage IIIb, gastroparesis, Hashimoto's thyroiditis, hypertension, hyperlipidemia, osteoporosis, OSA, MGUS, IPMN, who presents to the ED due to headache and vision changes.  Per chart review, patient was recently evaluated in the ED on 12/24 at which time she was diagnosed with both COVID-19 and RSV. [name] states that for the past 2 weeks, she has been experiencing a cough that initially seem to be getting better, but in the past couple days, has seemed more productive.  Yesterday, she was having a coughing fit that caused some sputum production and led to her vomiting.  After vomiting, she noticed that she was having some vision changes in her left eye.  She states that she initially saw floaters, but now she is unable to see out the left side of her left eye.  Due to this, she came to the ED for evaluation.  [name] also endorses lower extremity edema but states this is not unusual for her, however she has been experiencing worsening orthopnea lately.  She denies any chest pain or palpitations.  ED course: On arrival to the ED, patient was hypertensive at 172/71 with heart rate of 86.  She was afebrile at 98.4.Initial workup remarkable for normal CBC.  Creatinine elevated at 1.34 consistent with patient's baseline.  GFR 41.  Urinalysis was obtained that showed small hematuria.  Chest x-ray was obtained that did not show any acute cardiopulmonary disease.  Ophthalmology was initially consulted with recommendations to obtain an MRI.  MRI brain did demonstrate acute  right PCA infarct.  CT venogram did not show any evidence of dural venous sinus thrombosis.  TRH contacted for admission for acute CVA.  Past Medical History:  Diagnosis Date   (HFpEF) heart failure with preserved ejection fraction ([place])    a.) TTE 02/07/2020: EF 55%. b.) TTE 08/26/2021: EF >55%; mild BAE   Aortic stenosis    a.) TTE 02/07/2020: EF 55%; trivial MR; mild AS with MPG 8 mmHg. b.) TTE 08/26/2021: EF >55%; mild MR/TR/PR; mild BAE; mild AS with MPG 21 mmHg.   Arthritis    BILATERAL rotator cuff tendinitis    CHF (congestive heart failure) (HCC)    CKD (chronic kidney disease), stage III (HCC)    Complication of anesthesia    a.) (+) aspiration even during colonoscopy   CTS (carpal tunnel syndrome)    Depression    DOE (dyspnea on exertion)    Dyspnea    Gastroparesis    GERD (gastroesophageal reflux disease)    Hashimoto's thyroiditis    Heart murmur    History of hiatal hernia    HLD (hyperlipidemia)    Hypertension    IDA (iron deficiency anemia)    Insomnia    LADA (latent autoimmune diabetes in adults), managed as type 1 ([place])    a.) uses insulin pump   Obesity    Osteopenia    Osteoporosis    Pancreatic cyst    Pneumonia    PONV (postoperative nausea and vomiting)    Pseudophakia of both eyes  Renal cyst, right    Restless leg syndrome    Sleep apnea    a.) unable to tolerate nocturnal PAP therapy   Stroke ([place]) 2017   field of vision lower on right eye   TIA (transient ischemic attack) 2017   Past Surgical History:  Procedure Laterality Date   [place] Right 2004   screws;plate   BRACHIOPLASTY Bilateral 2018   fat removal from both upper arms   BREAST SURGERY  2000   bilateral mastectomy   CARPAL TUNNEL RELEASE Right 2004   CARPAL TUNNEL RELEASE Right 04/04/2020   Procedure: OPEN CARPAL TUNNEL RELEASE;  Surgeon: Corky Mull, MD;  Location: [facility] ORS;  Service: Orthopedics;  Laterality: Right;    COLONOSCOPY     JOINT REPLACEMENT Bilateral    knee   SHOULDER ARTHROSCOPY Right 2016   TOTAL SHOULDER ARTHROPLASTY Right 09/05/2021   Procedure: TOTAL SHOULDER ARTHROPLASTY;  Surgeon: Corky Mull, MD;  Location: [facility] ORS;  Service: Orthopedics;  Laterality: Right;   UPPER ESOPHAGEAL ENDOSCOPIC ULTRASOUND (EUS)  2020   pancreas   Social History:  reports that she has never smoked. She has never used smokeless tobacco. She reports current alcohol use. She reports that she does not use drugs.  Allergies  Allergen Reactions   Empagliflozin Nausea And Vomiting and Other (See Comments)    Decreased energy ([place])   Tape Other (See Comments)    Causes rash, blistering( if on for several days)   Paper tape and tegaderm  is OK   Atorvastatin Other (See Comments)    Increases blood sugar   Furosemide Itching and Rash   Lisinopril Cough   Rosuvastatin Other (See Comments)    Increases blood sugar   Trulicity [Dulaglutide] Diarrhea and Nausea And Vomiting    Family History  Problem Relation Age of Onset   Alzheimer's disease Mother    Cancer Father     Prior to Admission medications   Medication Sig Start Date End Date Taking? Authorizing Provider  amLODipine (NORVASC) 10 MG tablet Take 10 mg by mouth daily. 07/12/21   [provider]  aspirin EC 325 MG tablet Take 1 tablet (325 mg total) by mouth daily. 09/06/21   Lattie Corns, PA-C  b complex vitamins tablet Take 1 tablet by mouth daily.    [provider]  bumetanide (BUMEX) 0.5 MG tablet Take 1 tablet (0.5 mg total) by mouth daily. 11/05/21 12/05/21  Blake Divine, MD  carbamazepine (TEGRETOL XR) 100 MG 12 hr tablet Take 100 mg by mouth 2 (two) times daily. Patient not taking: Reported on 11/13/2021 08/08/21   [provider]  cephALEXin (KEFLEX) 500 MG capsule Take 500 mg by mouth as needed. For dental appointments    [provider]  Cholecalciferol (VITAMIN D) 50 MCG (2000 UT)  tablet Take 2,000 Units by mouth daily.    [provider]  escitalopram (LEXAPRO) 10 MG tablet Take 10 mg by mouth daily. 09/18/20   [provider]  hydrALAZINE (APRESOLINE) 10 MG tablet Take 5 mg by mouth as needed.    [provider]  insulin aspart (NOVOLOG) 100 UNIT/ML injection Inject 70-80 Units into the skin daily. Via OmniPod Insulin pump 01/18/20   [provider]  losartan (COZAAR) 50 MG tablet Take 50 mg by mouth at bedtime. 07/22/20   [provider]  metoprolol succinate (TOPROL-XL) 25 MG 24 hr tablet Take 12.5 mg by mouth daily. Patient  not taking: Reported on 11/13/2021 08/23/21   [provider]  Multiple Vitamins-Minerals (MULTIVITAMIN WITH MINERALS) tablet Take 1 tablet by mouth daily.    [provider]  naproxen sodium (ALEVE) 220 MG tablet Take 220 mg by mouth as needed.    [provider]  omeprazole (PRILOSEC) 20 MG capsule Take 20 mg by mouth as needed. 12/09/19   [provider]  pravastatin (PRAVACHOL) 80 MG tablet Take 80 mg by mouth at bedtime. 12/10/19   [provider]  rOPINIRole (REQUIP) 2 MG tablet Take 2 mg by mouth at bedtime. 12/16/19   [provider]    Physical Exam: Vitals:   11/21/22 1138 11/21/22 1330 11/21/22 1400 11/21/22 1629  BP: (!) 172/71 (!) 145/62 (!) 140/59   Pulse: 86 77 73   Resp: 18 20 (!) 23   Temp: 98.4 F (36.9 C)   98.9 F (37.2 C)  TempSrc: Oral   Oral  SpO2: 98% 96% 94%    Physical Exam Vitals and nursing note reviewed.  Constitutional:      General: She is not in acute distress.    Appearance: She is obese. She is not toxic-appearing.  HENT:     Head: Normocephalic and atraumatic.     Mouth/Throat:     Mouth: Mucous membranes are moist.     Pharynx: Oropharynx is clear.  Eyes:     General: No scleral icterus.    Extraocular Movements: Extraocular movements intact.     Conjunctiva/sclera: Conjunctivae normal.     Pupils: Pupils  are equal, round, and reactive to light.  Cardiovascular:     Rate and Rhythm: Normal rate and regular rhythm.     Heart sounds: Murmur (3/6 decrescendo systolic murmur) heard.  Pulmonary:     Effort: Pulmonary effort is normal. No respiratory distress.     Breath sounds: Decreased breath sounds (Right lower and middle lung field) and rales (Left basilar region) present. No wheezing or rhonchi.  Abdominal:     General: Bowel sounds are normal. There is no distension.     Palpations: Abdomen is soft.     Tenderness: There is no abdominal tenderness. There is no guarding.  Musculoskeletal:     Cervical back: Neck supple.     Right lower leg: No edema.     Left lower leg: No edema.     Comments: Size discrepancy noted between right and left lower extremities, right larger than left.  Patient states this is chronic and unchanged.  Skin:    General: Skin is warm and dry.  Neurological:     Mental Status: She is alert and oriented to person, place, and time.     Comments:  No facial asymmetry or dysarthria. Left temporal vision deficit noted Right medial visual deficit noted Vision otherwise intact 5 out of 5 strength in all extremities Sensation intact throughout  Psychiatric:        Mood and Affect: Mood normal.        Behavior: Behavior normal.        Thought Content: Thought content normal.        Judgment: Judgment normal.    Data Reviewed: CBC with WBC of 8.0, hemoglobin of 12.1, platelets of 304. BMP with sodium of 138, potassium of 3.4, glucose of 276, BUN of 32, creatinine of 1.34 and GFR 41 Urinalysis with small hemoglobin, no bacteria, WBC or RBC.  EKG personally reviewed.  Sinus rhythm with a rate of 80.  Nonspecific T  wave inversions noted.  No changes compared to EKG obtained on 11/10/2022.  MR ANGIO HEAD WO CONTRAST  Result Date: 11/21/2022 CLINICAL DATA:  Neuro deficit, acute, stroke suspected. Acute right PCA infarct on MRI. EXAM: MRA NECK WITHOUT AND WITH  CONTRAST MRA HEAD WITHOUT CONTRAST TECHNIQUE: Multiplanar and multiecho pulse sequences of the neck were obtained without and with intravenous contrast. Angiographic images of the neck were obtained using MRA technique without and with intravenous contrast; Angiographic images of the Circle of Willis were obtained using MRA technique without intravenous contrast. CONTRAST:  7.6m GADAVIST GADOBUTROL 1 MMOL/ML IV SOLN COMPARISON:  None Available. FINDINGS: MRA NECK FINDINGS The study is mildly motion degraded. There is a standard 3 vessel aortic arch. The brachiocephalic and subclavian arteries are widely patent. The common carotid and cervical internal carotid arteries are patent bilaterally without evidence of a significant stenosis or dissection. The vertebral arteries are patent and codominant without evidence of a significant stenosis or dissection allowing for motion artifact through the proximal V1 segments. MRA HEAD FINDINGS The intracranial vertebral arteries are widely patent to the basilar. Patent PICA and SCA origins are seen bilaterally. The basilar artery is widely patent. Posterior communicating arteries are diminutive or absent. Both PCAs are widely patent proximally, however there is occlusion of a right P3 branch. The internal carotid arteries are widely patent from skull base to carotid termini. ACAs and MCAs are patent without evidence of a proximal branch occlusion or significant proximal stenosis. No aneurysm is identified. IMPRESSION: 1. Right P3 branch occlusion. 2. No significant proximal intracranial arterial stenosis. 3. Negative neck MRA Electronically Signed   By: ALogan BoresM.D.   On: 11/21/2022 18:18   MR MRA NECK W CONTRAST  Result Date: 11/21/2022 CLINICAL DATA:  Neuro deficit, acute, stroke suspected. Acute right PCA infarct on MRI. EXAM: MRA NECK WITHOUT AND WITH CONTRAST MRA HEAD WITHOUT CONTRAST TECHNIQUE: Multiplanar and multiecho pulse sequences of the neck were obtained  without and with intravenous contrast. Angiographic images of the neck were obtained using MRA technique without and with intravenous contrast; Angiographic images of the Circle of Willis were obtained using MRA technique without intravenous contrast. CONTRAST:  7.512mGADAVIST GADOBUTROL 1 MMOL/ML IV SOLN COMPARISON:  None Available. FINDINGS: MRA NECK FINDINGS The study is mildly motion degraded. There is a standard 3 vessel aortic arch. The brachiocephalic and subclavian arteries are widely patent. The common carotid and cervical internal carotid arteries are patent bilaterally without evidence of a significant stenosis or dissection. The vertebral arteries are patent and codominant without evidence of a significant stenosis or dissection allowing for motion artifact through the proximal V1 segments. MRA HEAD FINDINGS The intracranial vertebral arteries are widely patent to the basilar. Patent PICA and SCA origins are seen bilaterally. The basilar artery is widely patent. Posterior communicating arteries are diminutive or absent. Both PCAs are widely patent proximally, however there is occlusion of a right P3 branch. The internal carotid arteries are widely patent from skull base to carotid termini. ACAs and MCAs are patent without evidence of a proximal branch occlusion or significant proximal stenosis. No aneurysm is identified. IMPRESSION: 1. Right P3 branch occlusion. 2. No significant proximal intracranial arterial stenosis. 3. Negative neck MRA Electronically Signed   By: AlLogan Bores.D.   On: 11/21/2022 18:18   CT VENOGRAM HEAD  Result Date: 11/21/2022 CLINICAL DATA:  Dural venous sinus thrombosis suspected. EXAM: CT VENOGRAM HEAD TECHNIQUE: Venographic phase images of the brain were obtained following the administration  of intravenous contrast. Multiplanar reformats and maximum intensity projections were generated. RADIATION DOSE REDUCTION: This exam was performed according to the departmental  dose-optimization program which includes automated exposure control, adjustment of the mA and/or kV according to patient size and/or use of iterative reconstruction technique. CONTRAST:  81m OMNIPAQUE IOHEXOL 350 MG/ML SOLN COMPARISON:  Head MRI 11/21/2022 FINDINGS: Hypoattenuation medially in the right occipital lobe corresponds to the acute infarct on MRI. Hypodensities elsewhere in the cerebral white matter bilaterally are nonspecific but compatible with mild chronic small vessel ischemic disease. There is mild cerebral atrophy. Punctate cortical or sulcal calcifications are noted in the left occipital region. No acute fracture or suspicious osseous lesion is identified. There is mild mucosal thickening in the paranasal sinuses, and a small amount of fluid is noted in the left maxillary sinus. Bilateral cataract extraction is noted. The superior sagittal sinus, internal cerebral veins, vein of Galen, straight sinus, transverse sinuses, sigmoid sinuses, and jugular bulbs are patent without evidence of thrombus or significant stenosis. The right transverse and sigmoid sinuses are dominant. The proximal right PCA is grossly patent, however the arterial structures are not assessed in detail on this venous study. IMPRESSION: 1. No dural venous sinus thrombosis. 2. Acute right PCA infarct. Electronically Signed   By: ALogan BoresM.D.   On: 11/21/2022 16:23   MR BRAIN WO CONTRAST  Result Date: 11/21/2022 CLINICAL DATA:  Neuro deficit, acute, stroke suspected. Visual disturbance. EXAM: MRI HEAD WITHOUT CONTRAST TECHNIQUE: Multiplanar, multiecho pulse sequences of the brain and surrounding structures were obtained without intravenous contrast. COMPARISON:  None Available. FINDINGS: Brain: There is a small to moderate-sized acute right PCA territory infarct involving the medial right occipital lobe. No intracranial hemorrhage, mass, midline shift, or extra-axial fluid collection is identified. Scattered small T2  hyperintensities in the cerebral white matter bilaterally are nonspecific but compatible with mild chronic small vessel ischemic disease. A chronic left caudate lacunar infarct is noted. There is mild cerebral atrophy. Vascular: Major intracranial vascular flow voids are preserved. Skull and upper cervical spine: Unremarkable bone marrow signal. Sinuses/Orbits: Bilateral cataract extraction. Mild mucosal thickening in the paranasal sinuses. Trace right mastoid fluid. Other: None. IMPRESSION: 1. Acute right PCA infarct. 2. Mild chronic small vessel ischemic disease. Electronically Signed   By: ALogan BoresM.D.   On: 11/21/2022 15:37   DG Chest 2 View  Result Date: 11/21/2022 CLINICAL DATA:  Shortness of breath EXAM: CHEST - 2 VIEW COMPARISON:  November 10, 2022 FINDINGS: Mild haziness in the left base is stable, likely atelectasis or scar. Chronic bronchitic changes. The cardiomediastinal silhouette is stable. No pneumothorax. No other acute abnormalities are identified. IMPRESSION: No active cardiopulmonary disease. Chronic bronchitic changes. Electronically Signed   By: DDorise BullionIII M.D.   On: 11/21/2022 12:05    There are no new results to review at this time.  Assessment and Plan: * Acute CVA (cerebrovascular accident) ([facility] 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  Patient is outside the window for tPA.  Previous stroke was felt to be small vessel in nature, however given recent COVID-19 infection, she is at risk for coagulopathy.  - Neurology consulted; appreciate their recommendations - Telemetry monitoring - Allow for permissive HTN (systolic < 2665and diastolic < 1993 - Echocardiogram  - Lipid panel  - s/p ASA 325 mg. Resume home 81 mg daily tomorrow - Discontinue  home pravastatin - Start high intensity statin - PT/OT  Productive cough Patient states that since  testing positive for COVID-19 and RSV approximately 2 weeks ago, her cough has been persistently present, however seems slightly worse in the past few days.  This may be secondary to mild pulmonary edema.  Differential also includes reactive airway disease secondary to recent infection.  No focal opacities on chest x-ray to suggest postviral bacterial pneumonia.  - Diuretics as noted below - Tussionex as needed - Continue home albuterol  Chronic heart failure with preserved ejection fraction (HFpEF) ([place]) Patient has a history of HFpEF with last EF of 73% and mild asymmetric hypertrophy of the basilar interventricular septum.  BNP is mildly elevated today, with Rales on examination and increased cough.  Due to this, will give an extra dose of home Bumex today.  - Resume home Bumex tomorrow - 1 time dose of Bumex 0.5 mg today - Daily weights - Strict in and out  Stage 3b chronic kidney disease ([place]) Renal function currently at baseline.  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  Per most recent insulin pump review in November 2023, patient uses approximately 65 units/day, of which 56% is basal and 44% as bolus.  She uses approximately 37 units of basal per day and 29 units of bolus.  Patient would prefer to use our insulin while she is in the hospital rather than her OmniPod.  - SSI, moderate - Semglee 20 units at bedtime  Advance Care Planning:   Code Status: Full Code.  Patient states that in the case of a cardiac or pulmonary arrest, she would want resuscitative efforts to be attempted.  However, she would not want to be on long-term life support if there was a low chance for meaningful recovery.  Her daughter Alyse Low is her healthcare power of attorney, per her living well.  Consults: Neurology  Family Communication: No family at bedside  Severity of  Illness: The appropriate patient status for this patient is OBSERVATION. Observation status is judged to be reasonable and necessary in order to provide the required intensity of service to ensure the patient's safety. The patient's presenting symptoms, physical exam findings, and initial radiographic and laboratory data in the context of their medical condition is felt to place them at decreased risk for further clinical deterioration. Furthermore, it is anticipated that the patient will be medically stable for discharge from the hospital within 2 midnights of admission.   Author: Jose Persia, MD 11/21/2022 8:27 PM  For on call review [email or web address].

## 2022-11-21 NOTE — Assessment & Plan Note (Signed)
Renal function currently at baseline.  - Monitor renal function while admitted

## 2022-11-21 NOTE — Assessment & Plan Note (Signed)
Patient has a history of HFpEF with last EF of 73% and mild asymmetric hypertrophy of the basilar interventricular septum.  BNP is mildly elevated today, with Rales on examination and increased cough.  Due to this, will give an extra dose of home Bumex today.  - Resume home Bumex tomorrow - 1 time dose of Bumex 0.5 mg today - Daily weights - Strict in and out

## 2022-11-21 NOTE — Assessment & Plan Note (Addendum)
A1c elevated at 8.3% approximately 1 month ago.  Per most recent insulin pump review in November 2023, patient uses approximately 65 units/day, of which 56% is basal and 44% as bolus.  She uses approximately 37 units of basal per day and 29 units of bolus.  Patient would prefer to use our insulin while she is in the hospital rather than her OmniPod.  - SSI, moderate - Semglee 20 units at bedtime

## 2022-11-22 ENCOUNTER — Observation Stay
Admission: EM | Admit: 2022-11-22 | Discharge: 2022-11-22 | Disposition: A | Discharge status: Home / Self Care | Payer: Medicare Other | Source: Home / Self Care | Attending: Internal Medicine | Admitting: Internal Medicine

## 2022-11-22 DIAGNOSIS — I639 Cerebral infarction, unspecified: Secondary | ICD-10-CM | POA: Diagnosis not present

## 2022-11-22 LAB — BASIC METABOLIC PANEL
Anion gap: 9 (ref 5–15)
BUN: 29 mg/dL — ABNORMAL HIGH (ref 8–23)
CO2: 26 mmol/L (ref 22–32)
Calcium: 8.6 mg/dL — ABNORMAL LOW (ref 8.9–10.3)
Chloride: 102 mmol/L (ref 98–111)
Creatinine, Ser: 1.31 mg/dL — ABNORMAL HIGH (ref 0.44–1.00)
GFR, Estimated: 42 mL/min — ABNORMAL LOW (ref >60)
Glucose, Bld: 269 mg/dL — ABNORMAL HIGH (ref 70–99)
Potassium: 4.1 mmol/L (ref 3.5–5.1)
Sodium: 137 mmol/L (ref 135–145)

## 2022-11-22 LAB — ECHOCARDIOGRAM COMPLETE
AR max vel: 1.27 cm2
AV Area VTI: 1.24 cm2
AV Area mean vel: 1.26 cm2
AV Mean grad: 14 mmHg
AV Peak grad: 25.9 mmHg
Ao pk vel: 2.55 m/s
Area-P 1/2: 3.28 cm2
Height: 60 in
S' Lateral: 3.1 cm
Weight: 2871.27 oz

## 2022-11-22 LAB — CBC WITH DIFFERENTIAL/PLATELET
Abs Immature Granulocytes: 0.03 10*3/uL (ref 0.00–0.07)
Basophils Absolute: 0.1 10*3/uL (ref 0.0–0.1)
Basophils Relative: 1 %
Eosinophils Absolute: 0.1 10*3/uL (ref 0.0–0.5)
Eosinophils Relative: 2 %
HCT: 32.1 % — ABNORMAL LOW (ref 36.0–46.0)
Hemoglobin: 10.5 g/dL — ABNORMAL LOW (ref 12.0–15.0)
Immature Granulocytes: 0 %
Lymphocytes Relative: 13 %
Lymphs Abs: 0.9 10*3/uL (ref 0.7–4.0)
MCH: 28.9 pg (ref 26.0–34.0)
MCHC: 32.7 g/dL (ref 30.0–36.0)
MCV: 88.4 fL (ref 80.0–100.0)
Monocytes Absolute: 0.7 10*3/uL (ref 0.1–1.0)
Monocytes Relative: 9 %
Neutro Abs: 5.2 10*3/uL (ref 1.7–7.7)
Neutrophils Relative %: 75 %
Platelets: 272 10*3/uL (ref 150–400)
RBC: 3.63 MIL/uL — ABNORMAL LOW (ref 3.87–5.11)
RDW: 14.2 % (ref 11.5–15.5)
WBC: 7 10*3/uL (ref 4.0–10.5)
nRBC: 0 % (ref 0.0–0.2)

## 2022-11-22 LAB — GLUCOSE, CAPILLARY
Glucose-Capillary: 251 mg/dL — ABNORMAL HIGH (ref 70–99)
Glucose-Capillary: 228 mg/dL — ABNORMAL HIGH (ref 70–99)
Glucose-Capillary: 231 mg/dL — ABNORMAL HIGH (ref 70–99)

## 2022-11-22 LAB — PROCALCITONIN: Procalcitonin: <0.1 ng/mL

## 2022-11-22 MED ORDER — HYDROCOD POLI-CHLORPHE POLI ER 10-8 MG/5ML PO SUER: 5.0000 mL | Freq: Two times a day (BID) | ORAL | 0 refills | Status: DC | PRN

## 2022-11-22 MED ORDER — CLOPIDOGREL BISULFATE 75 MG PO TABS
75.0000 mg | ORAL_TABLET | Freq: Every day | ORAL | Status: DC
  Administered 2022-11-22: 75 mg via ORAL
  Filled 2022-11-22: qty 1

## 2022-11-22 MED ORDER — GUAIFENESIN ER 600 MG PO TB12: 600.0000 mg | ORAL_TABLET | Freq: Two times a day (BID) | ORAL | 0 refills | Status: AC

## 2022-11-22 MED ORDER — ONDANSETRON HCL 4 MG/2ML IJ SOLN
4.0000 mg | Freq: Four times a day (QID) | INTRAMUSCULAR | Status: DC | PRN
  Administered 2022-11-22: 4 mg via INTRAVENOUS
  Filled 2022-11-22: qty 2

## 2022-11-22 MED ORDER — HYDROCOD POLI-CHLORPHE POLI ER 10-8 MG/5ML PO SUER: 5.0000 mL | Freq: Two times a day (BID) | ORAL | 0 refills | Status: AC | PRN

## 2022-11-22 MED ORDER — GUAIFENESIN-DM 100-10 MG/5ML PO SYRP
10.0000 mL | ORAL_SOLUTION | Freq: Four times a day (QID) | ORAL | Status: DC | PRN
  Administered 2022-11-22: 10 mL via ORAL
  Filled 2022-11-22: qty 10

## 2022-11-22 MED ORDER — PREDNISONE 50 MG PO TABS: 50.0000 mg | ORAL_TABLET | Freq: Every day | ORAL | 0 refills | Status: DC

## 2022-11-22 MED ORDER — CLOPIDOGREL BISULFATE 75 MG PO TABS: 75.0000 mg | ORAL_TABLET | Freq: Every day | ORAL | 11 refills | Status: AC

## 2022-11-22 MED ORDER — ASPIRIN 81 MG PO TBEC: 81.0000 mg | DELAYED_RELEASE_TABLET | Freq: Every day | ORAL | 0 refills | Status: AC

## 2022-11-22 MED ORDER — ASPIRIN 81 MG PO TBEC: 81.0000 mg | DELAYED_RELEASE_TABLET | Freq: Every day | ORAL | 0 refills | Status: DC

## 2022-11-22 MED ORDER — CLOPIDOGREL BISULFATE 75 MG PO TABS: 75.0000 mg | ORAL_TABLET | Freq: Every day | ORAL | 11 refills | Status: DC

## 2022-11-22 MED ORDER — GUAIFENESIN ER 600 MG PO TB12: 600.0000 mg | ORAL_TABLET | Freq: Two times a day (BID) | ORAL | 0 refills | Status: DC

## 2022-11-22 NOTE — Progress Notes (Signed)
Echocardiogram 2D Echocardiogram has been performed.  Lindsay Glenn 11/22/2022, 4:59 PM

## 2022-11-22 NOTE — Plan of Care (Addendum)
Neurology plan of care  Patient admitted for acute R PCA infarct 2/2 R P3 branch occlusion. Stroke workup is completed and there are no other neurologic concerns per Dr. Dwyane Dee therefore formal neurology consult not indicated at this time.  Recommendations: - plavix '75mg'$  daily + ASA '81mg'$  daily x90 days f/b plavix '75mg'$  daily  - OK to restart pravastatin, patient intolerant of lipitor and crestor - F/u TTE, if no intracardiac clot or other sig abnl OK to discharge patient today - Patient should have ambulatory cardiac monitoring set up as an outpatient given LA dilation on TTE - Patient may f/u with her established outpatient neurologist  Su Monks, MD [facility] [phone]  If [place], please page neurology on call as listed in [place].

## 2022-11-22 NOTE — Progress Notes (Deleted)
Patient admitted for acute R PCA infarct 2/2 R P3 branch occlusion. Stroke workup is completed other than TTE and there are no other neurologic concerns per Dr. Dwyane Dee therefore formal neurology consult not indicated at this time.  Recommendations: - plavix '75mg'$  daily + ASA '81mg'$  daily x90 days f/b plavix '75mg'$  daily  - OK to restart pravastatin, patient intolerant of lipitor and crestor - F/u TTE, if no intracardiac clot or other sig abnl OK to discharge patient today - I will arrange outpatient neurology f/u  Su Monks, MD [facility] [phone]  If 7pm- 7am, please page neurology on call as listed in [place].

## 2022-11-22 NOTE — Progress Notes (Addendum)
SLP Cancellation Note  Patient Details Name: Lindsay Glenn MRN: [id number] DOB: 1946/01/12   Cancelled treatment:       Reason Eval/Treat Not Completed: SLP screened, no needs identified, will sign off (chart reviewed; consulted NSG then met w/ pt in room)  Pt denied any difficulty swallowing and is currently on a regular diet; tolerates swallowing pills w/ water per NSG. Sipping on water while in room.  Pt conversed in conversation w/out expressive/receptive deficits noted; pt denied any speech-language deficits. Speech intelligible.  She stated that if she wore her Glasses she could "see better". Encouraged her to f/u w/ Neurology re: this; PT and OT d/t being the sole Driver for herself/husband(who has Dementia). Team updated via secure chat.  No further skilled ST services indicated as pt appears at her baseline. Pt agreed. NSG to reconsult if any change in status while admitted.     Orinda Kenner, MS, CCC-SLP Speech Language Pathologist Rehab Services; [place] [phone] (ascom) Verlyn Lambert 11/22/2022, 9:06 AM

## 2022-11-22 NOTE — Progress Notes (Signed)
Patient is being discharge home. Discharge papers given and explained to patient and daughter, both verbalized understanding. Meds and f/u appointments reviewed with patient. Rx was sent electronically to the pharmacy. Patient made aware.

## 2022-11-22 NOTE — Discharge Summary (Signed)
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 Compliance enforced to patient for statins.  Repeat lipid profile after 3 months. Follow-up with neurology in 1 to 2 weeks Follow up LABS/TEST:  as above   Diet recommendation: Carb modified diet  Activity: The patient is advised to gradually reintroduce usual activities, as tolerated  Discharge Condition: stable  Code Status: Full code   History of present illness: As per the H and P dictated on admission Hospital Course:  Lindsay Glenn is a 77 y.o. female with medical history significant of latent autoimmune diabetes, HFpEF, CKD stage IIIb, gastroparesis, Hashimoto's thyroiditis, hypertension, hyperlipidemia, osteoporosis, OSA, MGUS, IPMN, who presents to the ED due to headache and vision changes. Per chart review, patient was recently evaluated in the ED on 12/24 at which time she was diagnosed with both COVID-19 and RSV. Lindsay Glenn states that for the past 2 weeks, she has been experiencing a cough that initially seem to be getting better, but in the past couple days, has seemed more productive.  Yesterday, she was having a coughing fit that caused some sputum production and led to her vomiting.  After vomiting, she noticed that she was having some vision changes in her left eye.  She states that she initially  saw floaters, but now she is unable to see out the left side of her left eye.  Due to this, she came to the ED for evaluation. Lindsay Glenn also endorses lower extremity edema but states this is not unusual for her, however she has been experiencing worsening orthopnea lately.  She denies any chest pain or palpitations. ED course: On arrival to the ED, patient was hypertensive at 172/71 with heart rate of 86.  She was afebrile at 98.4.Initial workup remarkable for normal CBC.  Creatinine elevated at 1.34 consistent with patient's baseline.  GFR 41.  Urinalysis was obtained that showed small hematuria.  Chest x-ray was obtained that did not show any acute cardiopulmonary disease.  Ophthalmology was initially consulted with recommendations to obtain an MRI.  MRI brain did demonstrate acute right PCA infarct.  CT venogram did not show any evidence of dural venous sinus thrombosis.  [facility] contacted for admission for acute CVA.  Assessment and Plan:  # Acute CVA (cerebrovascular accident) [facility]) 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  Patient is outside the window for tPA.  Previous stroke was felt to be small vessel in nature, however given recent COVID-19 infection, she is at risk for coagulopathy. Neurology consulted, recommended plavix 75mg  daily + ASA 81mg  daily x90 days f/b plavix 75mg  daily. OK to restart pravastatin, patient intolerant of lipitor and crestor.  TTE negative for any intracardiac clot, no PFO.  Patient will need ambulatory cardiac monitor, recommended  to follow with her own cardiologist.  # Productive cough, patient had COVID-19 and RSV infection 2 weeks ago.,  Advised to continue symptomatic treatment and follow with PCP. # Chronic heart failure with PEF, BNP mildly elevated, clinically euvolemic.  Resumed home medications and recommended to follow with PCP for further  management as an outpatient. # Stage 3b chronic kidney disease, Renal function currently at baseline. 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 A1c elevated at 8.3% approximately 1 month ago.  Per most recent insulin pump review in November 2023, patient uses approximately 65 units/day, of which 56% is basal and 44% as bolus.  She uses approximately 37 units of basal per day and 29 units of bolus.  Patient would prefer to use our insulin while she is in the hospital rather than her OmniPod.  Recommended continue to monitor FSBG and continue diabetic diet.  Further management as per PCP. Body mass index is 35.05 kg/m.  Nutrition Interventions:     Patient was ambulatory without any assistance. Patient was seen by physical therapy, who recommended no therapy needed on discharge,  On the day of the discharge the patient's vitals were stable, and no other acute medical condition were reported by patient. the patient was felt safe to be discharge at Home.  Consultants: Neurology Procedures: None  Discharge Exam: General: Appear in no distress, no Rash; Oral Mucosa Clear, moist. Cardiovascular: S1 and S2 Present, no Murmur, Respiratory: normal respiratory effort, Bilateral Air entry present and no Crackles, no wheezes Abdomen: Bowel Sound present, Soft and no tenderness, no hernia Extremities: no Pedal edema, no calf tenderness Neurology: alert and oriented to time, place, and person affect appropriate.  Filed Weights   11/21/22 2208 11/22/22 0600  Weight: 82.4 kg 81.4 kg   Vitals:   11/22/22 1148 11/22/22 1215  BP: (!) 160/69   Pulse: 69 89  Resp: 16   Temp: 98.5 F (36.9 C)   SpO2: 91% (!) 89%    DISCHARGE MEDICATION: Allergies as of 11/22/2022       Reactions   Empagliflozin Nausea And Vomiting, Other (See Comments)   Decreased energy (Jardiance)   Tape Other (See Comments)   Causes rash, blistering( if on for several days)   Paper tape  and tegaderm  is OK   Atorvastatin Other (See Comments)   Increases blood sugar   Furosemide Itching, Rash   Lisinopril Cough   Rosuvastatin Other (See Comments)   Increases blood sugar   Trulicity [dulaglutide] Diarrhea, Nausea And Vomiting        Medication List     STOP taking these medications    naproxen sodium 220 MG tablet Commonly known as: ALEVE       TAKE these medications    amLODipine 10 MG tablet Commonly known as: NORVASC Take 10 mg by mouth daily.   aspirin EC 81 MG tablet Take 1 tablet (81 mg total) by mouth daily for 20 days. Swallow whole. Start taking on: November 23, 2022 What changed:  medication strength how much to take additional instructions   b complex vitamins tablet Take 1 tablet by mouth daily.   bumetanide 0.5 MG tablet Commonly known as: Bumex Take 1 tablet (0.5 mg total) by mouth daily.   cephALEXin 500 MG capsule Commonly known as: KEFLEX Take 500 mg by mouth as needed. For dental appointments   chlorpheniramine-HYDROcodone 10-8 MG/5ML Commonly known as: TUSSIONEX Take 5 mLs by mouth every 12 (twelve) hours as needed for  up to 7 days for cough.   clopidogrel 75 MG tablet Commonly known as: PLAVIX Take 1 tablet (75 mg total) by mouth daily.   escitalopram 10 MG tablet Commonly known as: LEXAPRO Take 10 mg by mouth daily.   guaiFENesin 600 MG 12 hr tablet Commonly known as: Mucinex Take 1 tablet (600 mg total) by mouth 2 (two) times daily for 7 days.   hydrALAZINE 10 MG tablet Commonly known as: APRESOLINE Take 5 mg by mouth as needed.   insulin aspart 100 UNIT/ML injection Commonly known as: novoLOG Inject 70-80 Units into the skin daily. Via OmniPod Insulin pump   lamoTRIgine 200 MG tablet Commonly known as: LAMICTAL Take 400 mg by mouth daily. At bedtime   losartan 100 MG tablet Commonly known as: COZAAR Take 100 mg by mouth at bedtime.   multivitamin with minerals tablet Take 1 tablet by mouth daily.    omeprazole 20 MG capsule Commonly known as: PRILOSEC Take 20 mg by mouth as needed.   pravastatin 80 MG tablet Commonly known as: PRAVACHOL Take 80 mg by mouth at bedtime.   predniSONE 50 MG tablet Commonly known as: DELTASONE Take 1 tablet (50 mg total) by mouth daily with breakfast. Risk of hyperglycemia, monitor blood sugar closely   rOPINIRole 2 MG tablet Commonly known as: REQUIP Take 2 mg by mouth at bedtime.   Vitamin D 50 MCG (2000 UT) tablet Take 2,000 Units by mouth daily.       Allergies  Allergen Reactions   Empagliflozin Nausea And Vomiting and Other (See Comments)    Decreased energy (Jardiance)   Tape Other (See Comments)    Causes rash, blistering( if on for several days)   Paper tape and tegaderm  is OK   Atorvastatin Other (See Comments)    Increases blood sugar   Furosemide Itching and Rash   Lisinopril Cough   Rosuvastatin Other (See Comments)    Increases blood sugar   Trulicity [Dulaglutide] Diarrhea and Nausea And Vomiting   Discharge Instructions     Call MD for:   Complete by: As directed    Any new neurological symptoms   Call MD for:  persistant dizziness or light-headedness   Complete by: As directed    Call MD for:  severe uncontrolled pain   Complete by: As directed    Diet - low sodium heart healthy   Complete by: As directed    Discharge instructions   Complete by: As directed    Follow with PCP in 1 week, continue to monitor BP at home and follow with PCP to titrate medications accordingly. Compliance enforced to patient for statins.  Repeat lipid profile after 3 months. Follow-up with neurology in 1 to 2 weeks   Increase activity slowly   Complete by: As directed        The results of significant diagnostics from this hospitalization (including imaging, microbiology, ancillary and laboratory) are listed below for reference.    Significant Diagnostic Studies: ECHOCARDIOGRAM COMPLETE  Result Date: 11/22/2022     ECHOCARDIOGRAM REPORT   Patient Name:   KRISTEL HOOSER [facility] Date of Exam: 11/22/2022 Medical Rec #:  [id number]    Height:       60.0 in Accession #:    [id number]   Weight:       179.5 lb Date of Birth:  11/28/1945   BSA:          1.783 m Patient Age:    34 years  BP:           160/69 mmHg Patient Gender: F            HR:           66 bpm. Exam Location:  [facility] Procedure: 2D Echo Indications:     Stroke 163.9  History:         Patient has no prior history of Echocardiogram examinations.  Sonographer:     Elwin Sleight RDCS Referring Phys:  [id number] Verdene Lennert Diagnosing Phys: Adrian Blackwater IMPRESSIONS  1. Left ventricular ejection fraction, by estimation, is 60 to 65%. The left ventricle has normal function. The left ventricle has no regional wall motion abnormalities. Left ventricular diastolic parameters are consistent with Grade I diastolic dysfunction (impaired relaxation).  2. Right ventricular systolic function is normal. The right ventricular size is normal.  3. Left atrial size was moderately dilated.  4. Right atrial size was mildly dilated.  5. The mitral valve is normal in structure. Moderate mitral valve regurgitation. No evidence of mitral stenosis.  6. Tricuspid valve regurgitation is moderate.  7. The aortic valve is calcified. Aortic valve regurgitation is mild. Mild aortic valve stenosis.  8. The inferior vena cava is normal in size with greater than 50% respiratory variability, suggesting right atrial pressure of 3 mmHg. Conclusion(s)/Recommendation(s): No intracardiac source of embolism detected on this transthoracic study. Consider a transesophageal echocardiogram to exclude cardiac source of embolism if clinically indicated. FINDINGS  Left Ventricle: Left ventricular ejection fraction, by estimation, is 60 to 65%. The left ventricle has normal function. The left ventricle has no regional wall motion abnormalities. The left ventricular internal cavity size was normal in size. There is  no left  ventricular hypertrophy. Left ventricular diastolic parameters are consistent with Grade I diastolic dysfunction (impaired relaxation). Right Ventricle: The right ventricular size is normal. No increase in right ventricular wall thickness. Right ventricular systolic function is normal. Left Atrium: Left atrial size was moderately dilated. Right Atrium: Right atrial size was mildly dilated. Pericardium: There is no evidence of pericardial effusion. Mitral Valve: The mitral valve is normal in structure. Moderate mitral valve regurgitation. No evidence of mitral valve stenosis. Tricuspid Valve: The tricuspid valve is normal in structure. Tricuspid valve regurgitation is moderate . No evidence of tricuspid stenosis. Aortic Valve: The aortic valve is calcified. Aortic valve regurgitation is mild. Mild aortic stenosis is present. Aortic valve mean gradient measures 14.0 mmHg. Aortic valve peak gradient measures 25.9 mmHg. Aortic valve area, by VTI measures 1.24 cm. Pulmonic Valve: The pulmonic valve was normal in structure. Pulmonic valve regurgitation is trivial. No evidence of pulmonic stenosis. Aorta: The aortic root is normal in size and structure. Venous: The inferior vena cava is normal in size with greater than 50% respiratory variability, suggesting right atrial pressure of 3 mmHg. IAS/Shunts: No atrial level shunt detected by color flow Doppler.  LEFT VENTRICLE PLAX 2D LVIDd:         5.40 cm   Diastology LVIDs:         3.10 cm   LV e' medial:    7.07 cm/s LV PW:         1.30 cm   LV E/e' medial:  15.9 LV IVS:        1.20 cm   LV e' lateral:   7.62 cm/s LVOT diam:     1.90 cm   LV E/e' lateral: 14.8 LV SV:         69 LV SV  Index:   38 LVOT Area:     2.84 cm  RIGHT VENTRICLE RV Basal diam:  3.00 cm RV S prime:     12.40 cm/s TAPSE (M-mode): 1.7 cm LEFT ATRIUM             Index        RIGHT ATRIUM           Index LA diam:        5.10 cm 2.86 cm/m   RA Area:     10.20 cm LA Vol (A2C):   66.3 ml 37.19 ml/m  RA  Volume:   20.00 ml  11.22 ml/m LA Vol (A4C):   80.0 ml 44.88 ml/m LA Biplane Vol: 76.7 ml 43.03 ml/m  AORTIC VALVE                     PULMONIC VALVE AV Area (Vmax):    1.27 cm      PV Vmax:       0.85 m/s AV Area (Vmean):   1.26 cm      PV Peak grad:  2.9 mmHg AV Area (VTI):     1.24 cm AV Vmax:           254.60 cm/s AV Vmean:          174.600 cm/s AV VTI:            0.553 m AV Peak Grad:      25.9 mmHg AV Mean Grad:      14.0 mmHg LVOT Vmax:         114.00 cm/s LVOT Vmean:        77.800 cm/s LVOT VTI:          0.242 m LVOT/AV VTI ratio: 0.44  AORTA Ao Root diam: 2.90 cm Ao Asc diam:  3.10 cm MITRAL VALVE                TRICUSPID VALVE MV Area (PHT): 3.28 cm     TR Peak grad:   43.6 mmHg MV Decel Time: 231 msec     TR Vmax:        330.00 cm/s MV E velocity: 112.50 cm/s MV A velocity: 138.00 cm/s  SHUNTS MV E/A ratio:  0.82         Systemic VTI:  0.24 m                             Systemic Diam: 1.90 cm Adrian Blackwater Electronically signed by Adrian Blackwater Signature Date/Time: 11/22/2022/5:09:19 PM    Final    MR ANGIO HEAD WO CONTRAST  Result Date: 11/21/2022 CLINICAL DATA:  Neuro deficit, acute, stroke suspected. Acute right PCA infarct on MRI. EXAM: MRA NECK WITHOUT AND WITH CONTRAST MRA HEAD WITHOUT CONTRAST TECHNIQUE: Multiplanar and multiecho pulse sequences of the neck were obtained without and with intravenous contrast. Angiographic images of the neck were obtained using MRA technique without and with intravenous contrast; Angiographic images of the Circle of Willis were obtained using MRA technique without intravenous contrast. CONTRAST:  7.8mL GADAVIST GADOBUTROL 1 MMOL/ML IV SOLN COMPARISON:  None Available. FINDINGS: MRA NECK FINDINGS The study is mildly motion degraded. There is a standard 3 vessel aortic arch. The brachiocephalic and subclavian arteries are widely patent. The common carotid and cervical internal carotid arteries are patent bilaterally without evidence of a significant stenosis or  dissection. The vertebral arteries are patent and codominant without  evidence of a significant stenosis or dissection allowing for motion artifact through the proximal V1 segments. MRA HEAD FINDINGS The intracranial vertebral arteries are widely patent to the basilar. Patent PICA and SCA origins are seen bilaterally. The basilar artery is widely patent. Posterior communicating arteries are diminutive or absent. Both PCAs are widely patent proximally, however there is occlusion of a right P3 branch. The internal carotid arteries are widely patent from skull base to carotid termini. ACAs and MCAs are patent without evidence of a proximal branch occlusion or significant proximal stenosis. No aneurysm is identified. IMPRESSION: 1. Right P3 branch occlusion. 2. No significant proximal intracranial arterial stenosis. 3. Negative neck MRA Electronically Signed   By: Sebastian Ache M.D.   On: 11/21/2022 18:18   MR MRA NECK W CONTRAST  Result Date: 11/21/2022 CLINICAL DATA:  Neuro deficit, acute, stroke suspected. Acute right PCA infarct on MRI. EXAM: MRA NECK WITHOUT AND WITH CONTRAST MRA HEAD WITHOUT CONTRAST TECHNIQUE: Multiplanar and multiecho pulse sequences of the neck were obtained without and with intravenous contrast. Angiographic images of the neck were obtained using MRA technique without and with intravenous contrast; Angiographic images of the Circle of Willis were obtained using MRA technique without intravenous contrast. CONTRAST:  7.8mL GADAVIST GADOBUTROL 1 MMOL/ML IV SOLN COMPARISON:  None Available. FINDINGS: MRA NECK FINDINGS The study is mildly motion degraded. There is a standard 3 vessel aortic arch. The brachiocephalic and subclavian arteries are widely patent. The common carotid and cervical internal carotid arteries are patent bilaterally without evidence of a significant stenosis or dissection. The vertebral arteries are patent and codominant without evidence of a significant stenosis or  dissection allowing for motion artifact through the proximal V1 segments. MRA HEAD FINDINGS The intracranial vertebral arteries are widely patent to the basilar. Patent PICA and SCA origins are seen bilaterally. The basilar artery is widely patent. Posterior communicating arteries are diminutive or absent. Both PCAs are widely patent proximally, however there is occlusion of a right P3 branch. The internal carotid arteries are widely patent from skull base to carotid termini. ACAs and MCAs are patent without evidence of a proximal branch occlusion or significant proximal stenosis. No aneurysm is identified. IMPRESSION: 1. Right P3 branch occlusion. 2. No significant proximal intracranial arterial stenosis. 3. Negative neck MRA Electronically Signed   By: Sebastian Ache M.D.   On: 11/21/2022 18:18   CT VENOGRAM HEAD  Result Date: 11/21/2022 CLINICAL DATA:  Dural venous sinus thrombosis suspected. EXAM: CT VENOGRAM HEAD TECHNIQUE: Venographic phase images of the brain were obtained following the administration of intravenous contrast. Multiplanar reformats and maximum intensity projections were generated. RADIATION DOSE REDUCTION: This exam was performed according to the departmental dose-optimization program which includes automated exposure control, adjustment of the mA and/or kV according to patient size and/or use of iterative reconstruction technique. CONTRAST:  75mL OMNIPAQUE IOHEXOL 350 MG/ML SOLN COMPARISON:  Head MRI 11/21/2022 FINDINGS: Hypoattenuation medially in the right occipital lobe corresponds to the acute infarct on MRI. Hypodensities elsewhere in the cerebral white matter bilaterally are nonspecific but compatible with mild chronic small vessel ischemic disease. There is mild cerebral atrophy. Punctate cortical or sulcal calcifications are noted in the left occipital region. No acute fracture or suspicious osseous lesion is identified. There is mild mucosal thickening in the paranasal sinuses, and  a small amount of fluid is noted in the left maxillary sinus. Bilateral cataract extraction is noted. The superior sagittal sinus, internal cerebral veins, vein of Galen, straight sinus, transverse sinuses,  sigmoid sinuses, and jugular bulbs are patent without evidence of thrombus or significant stenosis. The right transverse and sigmoid sinuses are dominant. The proximal right PCA is grossly patent, however the arterial structures are not assessed in detail on this venous study. IMPRESSION: 1. No dural venous sinus thrombosis. 2. Acute right PCA infarct. Electronically Signed   By: Sebastian Ache M.D.   On: 11/21/2022 16:23   MR BRAIN WO CONTRAST  Result Date: 11/21/2022 CLINICAL DATA:  Neuro deficit, acute, stroke suspected. Visual disturbance. EXAM: MRI HEAD WITHOUT CONTRAST TECHNIQUE: Multiplanar, multiecho pulse sequences of the brain and surrounding structures were obtained without intravenous contrast. COMPARISON:  None Available. FINDINGS: Brain: There is a small to moderate-sized acute right PCA territory infarct involving the medial right occipital lobe. No intracranial hemorrhage, mass, midline shift, or extra-axial fluid collection is identified. Scattered small T2 hyperintensities in the cerebral white matter bilaterally are nonspecific but compatible with mild chronic small vessel ischemic disease. A chronic left caudate lacunar infarct is noted. There is mild cerebral atrophy. Vascular: Major intracranial vascular flow voids are preserved. Skull and upper cervical spine: Unremarkable bone marrow signal. Sinuses/Orbits: Bilateral cataract extraction. Mild mucosal thickening in the paranasal sinuses. Trace right mastoid fluid. Other: None. IMPRESSION: 1. Acute right PCA infarct. 2. Mild chronic small vessel ischemic disease. Electronically Signed   By: Sebastian Ache M.D.   On: 11/21/2022 15:37   DG Chest 2 View  Result Date: 11/21/2022 CLINICAL DATA:  Shortness of breath EXAM: CHEST - 2 VIEW  COMPARISON:  November 10, 2022 FINDINGS: Mild haziness in the left base is stable, likely atelectasis or scar. Chronic bronchitic changes. The cardiomediastinal silhouette is stable. No pneumothorax. No other acute abnormalities are identified. IMPRESSION: No active cardiopulmonary disease. Chronic bronchitic changes. Electronically Signed   By: Gerome Sam III M.D.   On: 11/21/2022 12:05   DG Chest 2 View  Result Date: 11/10/2022 CLINICAL DATA:  Cough EXAM: CHEST - 2 VIEW COMPARISON:  11/05/2021 FINDINGS: Stable mild cardiomegaly. Aortic atherosclerotic calcification. Chronic bronchitic change. No focal consolidation, pleural effusion, or pneumothorax. No acute osseous abnormality. Chronic wedging of a few mid and lower thoracic vertebral bodies. IMPRESSION: No change from 11/05/2021. Chronic bronchitic thickening. Mild cardiomegaly. Electronically Signed   By: Minerva Fester M.D.   On: 11/10/2022 00:28    Microbiology: No results found for this or any previous visit (from the past 240 hour(s)).   Labs: CBC: Recent Labs  Lab 11/21/22 1140 11/22/22 0545  WBC 8.0 7.0  NEUTROABS  --  5.2  HGB 12.1 10.5*  HCT 37.8 32.1*  MCV 89.8 88.4  PLT 304 272   Basic Metabolic Panel: Recent Labs  Lab 11/21/22 1140 11/22/22 0545  NA 138 137  K 4.5 4.1  CL 103 102  CO2 24 26  GLUCOSE 276* 269*  BUN 32* 29*  CREATININE 1.34* 1.31*  CALCIUM 9.5 8.6*   Liver Function Tests: No results for input(s): "AST", "ALT", "ALKPHOS", "BILITOT", "PROT", "ALBUMIN" in the last 168 hours. No results for input(s): "LIPASE", "AMYLASE" in the last 168 hours. No results for input(s): "AMMONIA" in the last 168 hours. Cardiac Enzymes: No results for input(s): "CKTOTAL", "CKMB", "CKMBINDEX", "TROPONINI" in the last 168 hours. BNP (last 3 results) Recent Labs    11/10/22 0000 11/21/22 1140  BNP 186.9* 201.6*   CBG: Recent Labs  Lab 11/21/22 1719 11/21/22 2235 11/22/22 0938 11/22/22 1150  GLUCAP  172* 278* 251* 228*    Time spent: 35 minutes  Signed:  Gillis Santa  [facility]  11/22/2022 5:17 PM

## 2022-11-22 NOTE — Evaluation (Signed)
Physical Therapy Evaluation Patient Details Name: Lindsay Glenn MRN: [id number] DOB: 04-25-1946 Today's Date: 11/22/2022  History of Present Illness  Lindsay Glenn is a 77 y.o. female with medical history significant of latent autoimmune diabetes, HFpEF, CKD stage IIIb, gastroparesis, Hashimoto's thyroiditis, hypertension, hyperlipidemia, osteoporosis, OSA, MGUS, IPMN, who presents to the ED due to headache and vision changes. 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 She reports loss of vision in left lateral field as her biggest complaint.  Clinical Impression  77 yo Female admitted to hospital with recent CVA, reports impaired vision in left eye as her biggest complaint. Patient was recently diagnosed with COVID/RSV on 11/09/22. She reports having a cough since then and just not having as much energy. She denies any recent falls. Patient lives at home with her husband who has dementia as well as her daughter, son-in-law and grandkids. She reports she was the primary caretaker for her husband and did all the driving. She was independent in all self care ADLs. She is retired. She is currently independent/modified independent with bed mobility, transfers and walking. She does become short of breath with minimal activity. While sitting edge of bed at rest, Spo2 90%, HR 79. After walking 10 feet her Spo2 remained 90%; PT instructed Lindsay Glenn to walk in hallway, after 50-75 feet, her SPo2 dropped to 89% with increased shortness of breath. She does cough with minimal exertion. She was taken back to her room and instructed to sit in bedside chair. Her SPo2 quickly rebounded to 92% within 20 sec. She does exhibit generalized weakness in BLE. While she does have vision deficit in left eye, she was able to negotiate obstacles in room/hallway well without loss of balance. She ambulates with no apparent veering or instability. Patient would benefit from additional skilled PT Intervention to improve cardiovascular  endurance and LE strength. No additional skilled needs needed after discharge from hospital. Patient agreeable.        Recommendations for follow up therapy are one component of a multi-disciplinary discharge planning process, led by the attending physician.  Recommendations may be updated based on patient status, additional functional criteria and insurance authorization.  Follow Up Recommendations No PT follow up      Assistance Recommended at Discharge PRN  Patient can return home with the following  Assistance with cooking/housework;Assist for transportation;Help with stairs or ramp for entrance;A little help with bathing/dressing/bathroom    Equipment Recommendations None recommended by PT  Recommendations for Other Services       Functional Status Assessment Patient has had a recent decline in their functional status and demonstrates the ability to make significant improvements in function in a reasonable and predictable amount of time.     Precautions / Restrictions Precautions Precautions: Fall Restrictions Weight Bearing Restrictions: No      Mobility  Bed Mobility               General bed mobility comments: not observed, pt sitting up edge of bed during evaluation.    Transfers Overall transfer level: Modified independent                 General transfer comment: Requires increased time but able to transfer sit<>stand from bed and bedside chair with good safety awareness.    Ambulation/Gait Ambulation/Gait assistance: Modified independent (Device/Increase time) Gait Distance (Feet): 100 Feet Assistive device: None Gait Pattern/deviations: Step-through pattern Gait velocity: 1.62 feet/sec (10 feet walk test) Gait velocity interpretation: <1.8 ft/sec, indicate of risk for  recurrent falls   General Gait Details: Pt ambulates with reciprocal gait pattern, slower gait speed (1.62 feet/sec), normal base of support, no veering or drifting; able to turn  well with good dynamic balance; Concerned about SPO2/low endurance. After walking short distance 50-75 feet, SPo2 dropped to 89% on room air with increased shortness of breath; Pt taken back to bedside chair, Spo2 recovered to 92% within 20 sec; RN notified;  Stairs            Wheelchair Mobility    Modified Rankin (Stroke Patients Only)       Balance Overall balance assessment: Modified Independent, No apparent balance deficits (not formally assessed)                                           Pertinent Vitals/Pain Pain Assessment Pain Assessment: 0-10 Pain Score: 3  Pain Location: headache Pain Intervention(s): Limited activity within patient's tolerance, Monitored during session    [place] expects to be discharged to:: Private residence Living Arrangements: Spouse/significant other;Children;Other relatives;Other (Comment) (Daughter, son in law and grandchildren live with them.) Available Help at Discharge: Family;Available 24 hours/day Type of Home: House Home Access: Stairs to enter   [employer] of Steps: 5   Home Layout: One level Home Equipment: None      Prior Function Prior Level of Function : Independent/Modified Independent             Mobility Comments: Lindsay Glenn is retired. She was still driving and was the primary caretaker for her husband who has dementia.       Hand Dominance   Dominant Hand: Right    Extremity/Trunk Assessment   Upper Extremity Assessment Upper Extremity Assessment: Defer to OT evaluation    Lower Extremity Assessment Lower Extremity Assessment: Generalized weakness    Cervical / Trunk Assessment Cervical / Trunk Assessment: Kyphotic  Communication   Communication: No difficulties  Cognition Arousal/Alertness: Awake/alert Behavior During Therapy: WFL for tasks assessed/performed Overall Cognitive Status: Within Functional Limits for tasks assessed                                  General Comments: oriented x4        General Comments General comments (skin integrity, edema, etc.): Pt has history of edema in BLE, however no significant edema noted in BLE during PT evaluation.    Exercises     Assessment/Plan    PT Assessment Patient needs continued PT services  PT Problem List Decreased strength;Decreased mobility;Cardiopulmonary status limiting activity;Decreased activity tolerance       PT Treatment Interventions Functional mobility training;Patient/family education;Gait training;Therapeutic activities;Stair training;Therapeutic exercise    PT Goals (Current goals can be found in the Care Plan section)  Acute Rehab PT Goals Patient Stated Goal: To get stronger and stop coughing PT Goal Formulation: With patient Time For Goal Achievement: 12/06/22 Potential to Achieve Goals: Good    Frequency Min 2X/week     Co-evaluation               AM-PAC PT "6 Clicks" Mobility  Outcome Measure Help needed turning from your back to your side while in a flat bed without using bedrails?: None Help needed moving from lying on your back to sitting on the side of a flat bed without using bedrails?: None Help  needed moving to and from a bed to a chair (including a wheelchair)?: None Help needed standing up from a chair using your arms (e.g., wheelchair or bedside chair)?: None Help needed to walk in hospital room?: None Help needed climbing 3-5 steps with a railing? : A Little 6 Click Score: 23    End of Session Equipment Utilized During Treatment: Gait belt Activity Tolerance: Patient limited by fatigue Patient left: in chair;with call bell/phone within reach Nurse Communication: Mobility status PT Visit Diagnosis: Muscle weakness (generalized) (M62.81);Difficulty in walking, not elsewhere classified (R26.2)    Time: 7564-3329 PT Time Calculation (min) (ACUTE ONLY): 20 min   Charges:   PT Evaluation $PT Eval Low Complexity:  1 Low          Roniel Halloran PT, DPT 11/22/2022, 12:23 PM

## 2022-11-22 NOTE — Plan of Care (Signed)

## 2022-11-22 NOTE — Evaluation (Signed)
Occupational Therapy Evaluation Patient Details Name: Lindsay Glenn MRN: [id number] DOB: [birth year] Today's Date: 11/22/2022   History of Present Illness Lindsay Glenn is a 77 y.o. female with medical history significant of latent autoimmune diabetes, HFpEF, CKD stage IIIb, gastroparesis, Hashimoto's thyroiditis, hypertension, hyperlipidemia, osteoporosis, OSA, MGUS, IPMN, who presents to the ED due to headache and vision changes. 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 She reports loss of vision in left lateral field as her biggest complaint.   Clinical Impression   Pt seen this date for OT referral for evaluation and treatment.  Pt admitted for acute right PCA infarct, history of COVID and RSV in the last 2 weeks with continued weakness.  Pt presents this date with left sided visual field deficits, generalized weakness and decreased activity tolerance affecting her daily ADL and IADL tasks.  Discussed recommendation to follow up with additional testing and ophthalmologist prior to return to driving for safety reasons.  She lives at home with her husband who has dementia and she is his primary caregiver.  Her daughter, son in law and grandson also live in the home and are able to provide support but also work outside the home.  Pt would benefit from skilled OT services during her hospital stay to focus on strengthening, activity tolerance, safety with ADL and IADL tasks as well as compensatory strategies for visual field impairments.       Recommendations for follow up therapy are one component of a multi-disciplinary discharge planning process, led by the attending physician.  Recommendations may be updated based on patient status, additional functional criteria and insurance authorization.   Follow Up Recommendations  No OT follow up     Assistance Recommended at Discharge    Patient can return home with the following Assistance with cooking/housework;Assist for transportation;A little help  with bathing/dressing/bathroom    Functional Status Assessment  Patient has had a recent decline in their functional status and demonstrates the ability to make significant improvements in function in a reasonable and predictable amount of time.  Equipment Recommendations  None recommended by OT    Recommendations for Other Services       Precautions / Restrictions Precautions Precautions: Fall Restrictions Weight Bearing Restrictions: No      Mobility Bed Mobility               General bed mobility comments: Pt sitting EOB on arrival    Transfers Overall transfer level: Modified independent                        Balance Overall balance assessment: Modified Independent, No apparent balance deficits (not formally assessed)                                         ADL either performed or assessed with clinical judgement   ADL Overall ADL's : Needs assistance/impaired Eating/Feeding: Independent   Grooming: Supervision/safety   Upper Body Bathing: Independent   Lower Body Bathing: Modified independent   Upper Body Dressing : Independent   Lower Body Dressing: Modified independent Lower Body Dressing Details (indicate cue type and reason): increased time allowed for task, difficulty with right sock to don and doff. Toilet Transfer: Warehouse manager and Hygiene: Modified independent       Functional mobility during ADLs: Supervision/safety General ADL Comments: Pt with mild  dificulty with managing right sock.  Pt demonstrates generalized weakness during self care tasks, requires rest breaks as needed.  She demonstrates decreased activity tolerance.  Will likely have difficulty with higher level IADL tasks such as cooking, cleaning and home management tasks.  Family available to assist as needed.  Recommend pt see opthalmologist prior to return to driving to further assess visual field impairment.      Vision Baseline Vision/History: 1 Wears glasses Patient Visual Report: Peripheral vision impairment Vision Assessment?: Vision impaired- to be further tested in functional context Additional Comments: Pt reports prior to admission she occasionally had difficulty with diplopia, images on top of each other rather than side to side images.  She reports this would come and go at times after her last CVA.  This date she appears to demonstrate left visual field impairment.  Pt will benefit from formal visual testing by neuroopthalmologist prior to return to driving.     Perception     Praxis      Pertinent Vitals/Pain Pain Assessment Pain Assessment: 0-10 Pain Score: 3  Pain Location: headache Pain Descriptors / Indicators: Aching     Hand Dominance Right   Extremity/Trunk Assessment Upper Extremity Assessment Upper Extremity Assessment: Generalized weakness   Lower Extremity Assessment Lower Extremity Assessment: Generalized weakness   Cervical / Trunk Assessment Cervical / Trunk Assessment: Kyphotic   Communication Communication Communication: No difficulties   Cognition Arousal/Alertness: Awake/alert Behavior During Therapy: WFL for tasks assessed/performed Overall Cognitive Status: Within Functional Limits for tasks assessed                                 General Comments: oriented x4     General Comments  Pt has history of edema in BLE, however no significant edema noted in BLE during PT evaluation.    Exercises     Shoulder Instructions      Home Living Family/patient expects to be discharged to:: Private residence Living Arrangements: Spouse/significant other;Children;Other relatives;Other (Comment) Available Help at Discharge: Family;Available 24 hours/day Type of Home: House Home Access: Stairs to enter [employer] of Steps: 5   Home Layout: One level     Bathroom Shower/Tub: Walk-in shower;Door   [employer] Toilet:  Standard     Home Equipment: None          Prior Functioning/Environment Prior Level of Function : Independent/Modified Independent             Mobility Comments: Lindsay Glenn is retired. She was still driving and was the primary caretaker for her husband who has dementia. ADLs Comments: Pt was independent prior to admission, primary caregiver for her husband, she is the driver for all appts.  She has a prior history of right shoulder replacement and knee replacement and had some mild difficulty with donning right sock prior to admission.        OT Problem List: Decreased strength;Decreased activity tolerance;Impaired vision/perception;Decreased safety awareness;Pain      OT Treatment/Interventions: Self-care/ADL training;DME and/or AE instruction;Therapeutic activities;Visual/perceptual remediation/compensation;Balance training;Therapeutic exercise;Energy conservation;Patient/family education    OT Goals(Current goals can be found in the care plan section) Acute Rehab OT Goals Patient Stated Goal: Pt reports she wants to return home with family, get stronger, return to driving OT Goal Formulation: With patient Time For Goal Achievement: 12/06/22 Potential to Achieve Goals: Good ADL Goals Pt Will Perform Lower Body Dressing: Independently Pt/caregiver will Perform Home Exercise Program: Increased strength;With written HEP provided  Additional ADL Goal #1: Pt will demonstrate understanding of compensatory strategies for vision during functional ADL and IADL task to perform with modified independence safely.  OT Frequency: Min 2X/week    Co-evaluation              AM-PAC OT "6 Clicks" Daily Activity     Outcome Measure Help from another person eating meals?: None Help from another person taking care of personal grooming?: None Help from another person toileting, which includes using toliet, bedpan, or urinal?: None Help from another person bathing (including washing, rinsing,  drying)?: None Help from another person to put on and taking off regular upper body clothing?: None Help from another person to put on and taking off regular lower body clothing?: A Little 6 Click Score: 23   End of Session Equipment Utilized During Treatment: Gait belt  Activity Tolerance: Patient tolerated treatment well Patient left: in bed;Other (comment) (seated EOB, PT in the room on departure.)  OT Visit Diagnosis: Muscle weakness (generalized) (M62.81);Pain;Unsteadiness on feet (R26.81) Pain - part of body:  (headache)                Time: 0254-2706 OT Time Calculation (min): 53 min Charges:  OT General Charges $OT Visit: 1 Visit OT Evaluation $OT Eval Low Complexity: 1 Low OT Treatments $Self Care/Home Management : 8-22 mins  Lupita Rosales T Arian Mcquitty, OTR/L, CLT Konstantina Nachreiner 11/22/2022, 12:54 PM

## 2022-11-22 NOTE — Progress Notes (Deleted)
  Echocardiogram 2D Echocardiogram has been performed.  Claretta Fraise 11/22/2022, 2:16 PM

## 2023-01-18 IMAGING — DX DG SHOULDER 2+V PORT*R*
2 series · 2 of 2 positions shown · non-contrast
Comparison: 06/06/2020

CLINICAL DATA: Status post right shoulder arthroplasty

EXAM:
PORTABLE RIGHT SHOULDER

[shoulder ap]
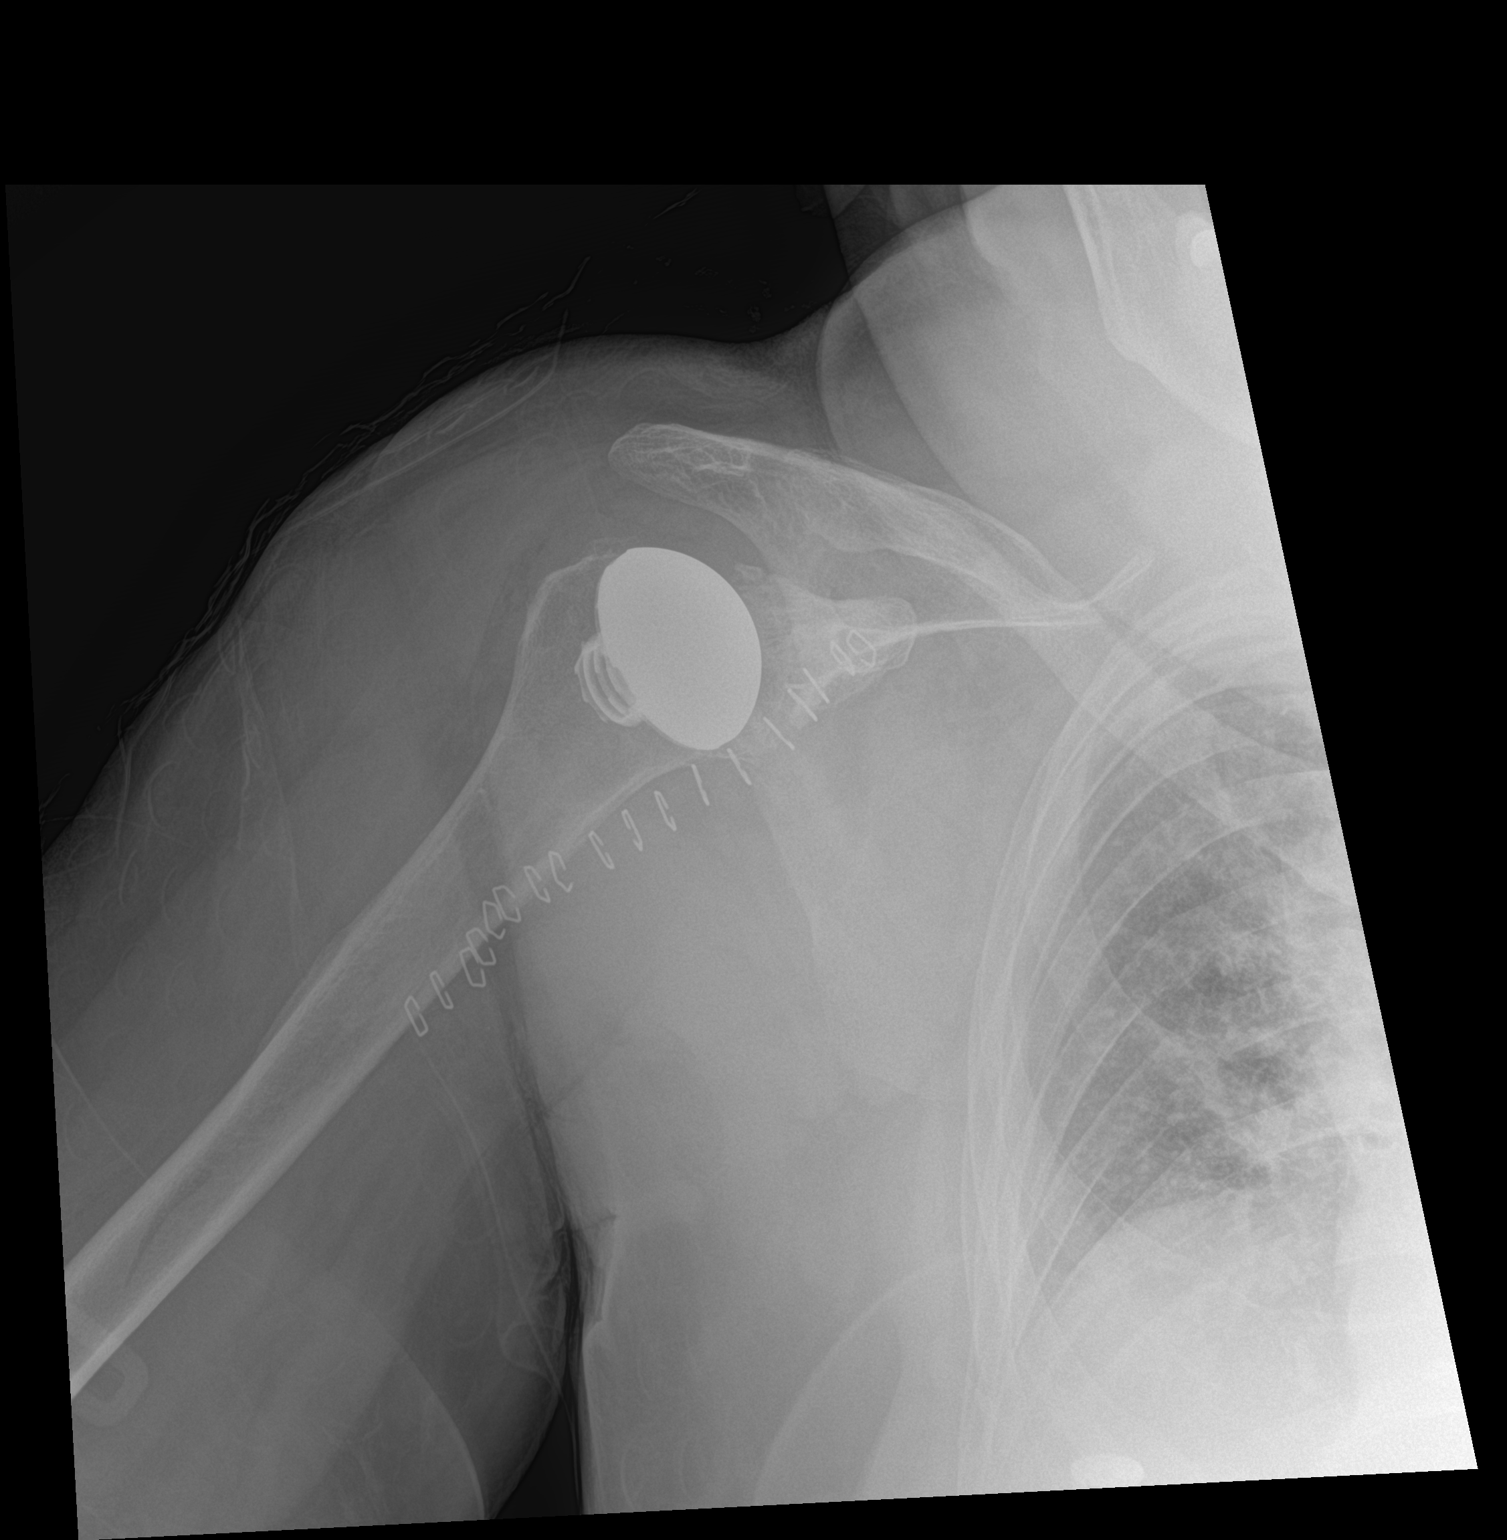

[shoulder obl]
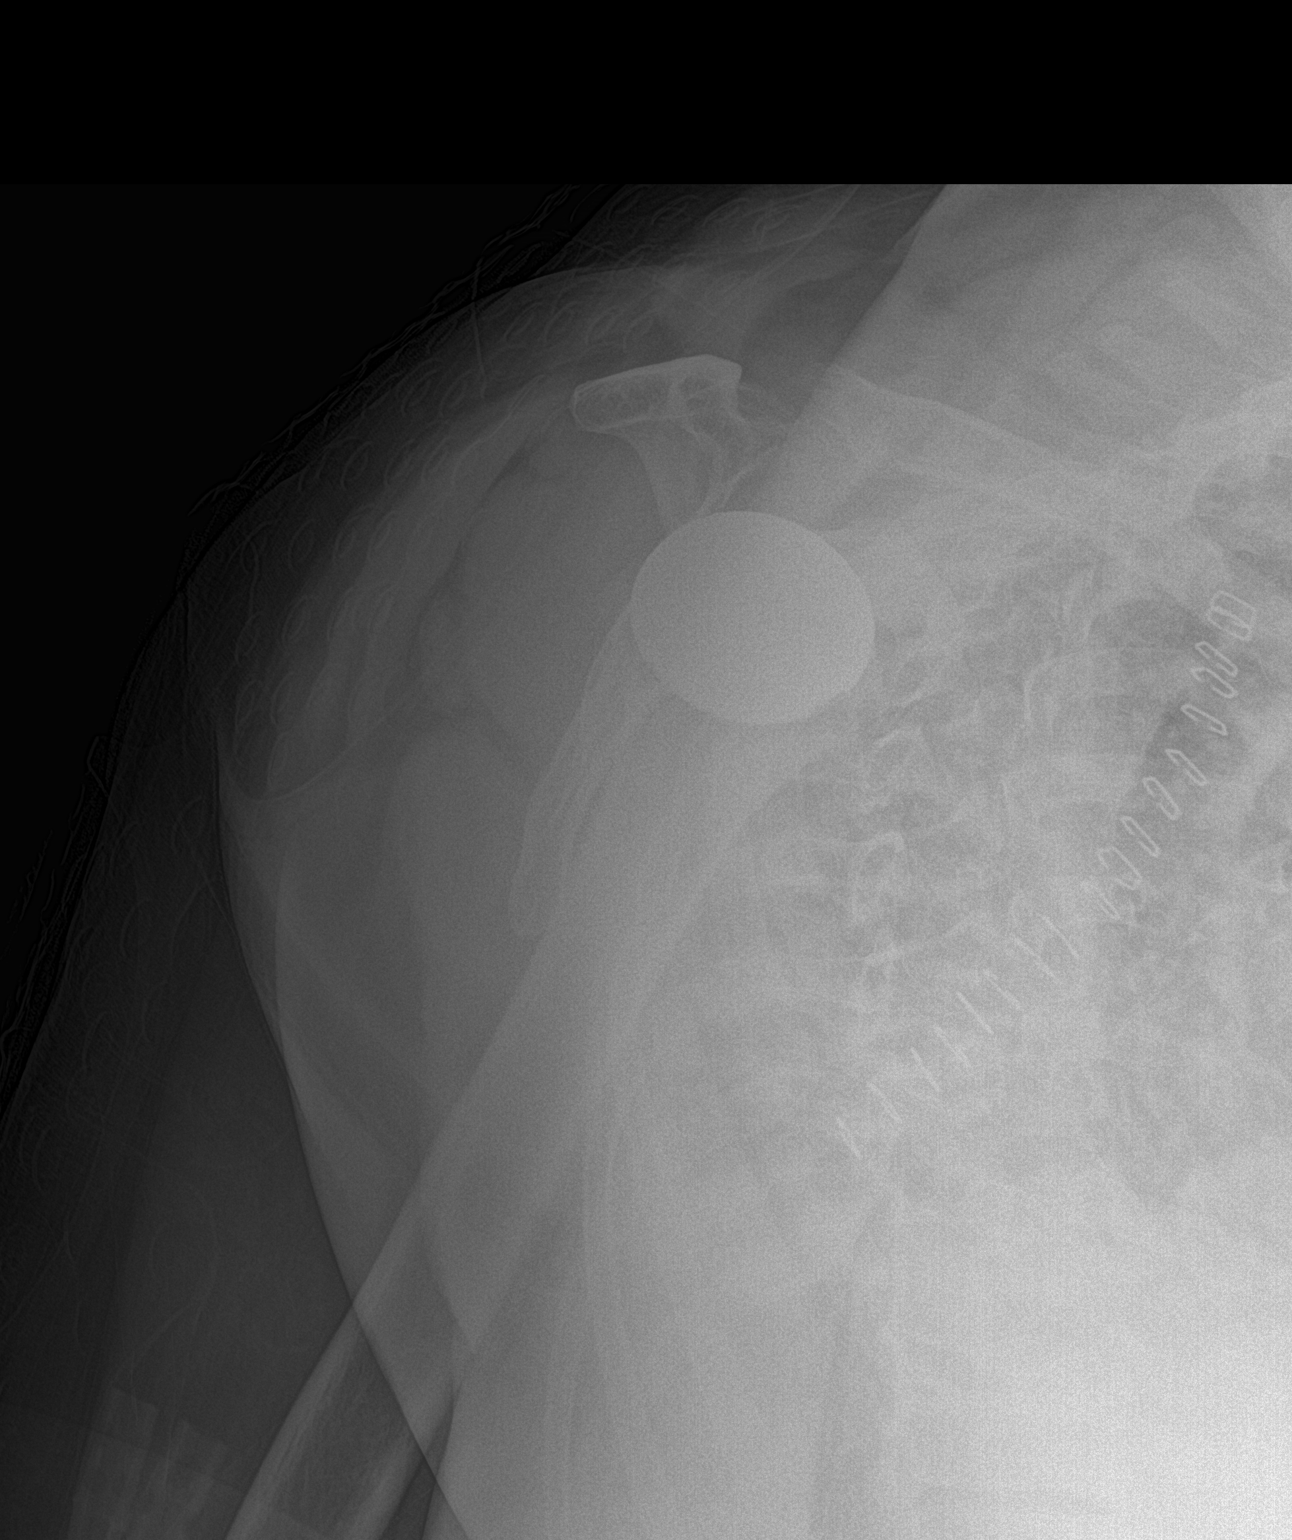

[2 of 2 positions shown; findings below may reference images not displayed]

FINDINGS: Interval postsurgical changes from right total shoulder
arthroplasty. Arthroplasty components are in their expected
alignment. No periprosthetic fracture or evidence of other
complication. Expected postoperative changes within the overlying
soft tissues.
IMPRESSION: Satisfactory postoperative appearance following right shoulder
arthroplasty, as above.

## 2023-03-20 IMAGING — CR DG CHEST 2V
1 series · 2 of 2 positions shown · non-contrast
Comparison: None.

CLINICAL DATA: Shortness of breath, hypertension since last night

EXAM:
CHEST - 2 VIEW

[Series 1: dg chest 2 view · 0.14mm/px · 2 of 2 slices shown]
[im 1/2]
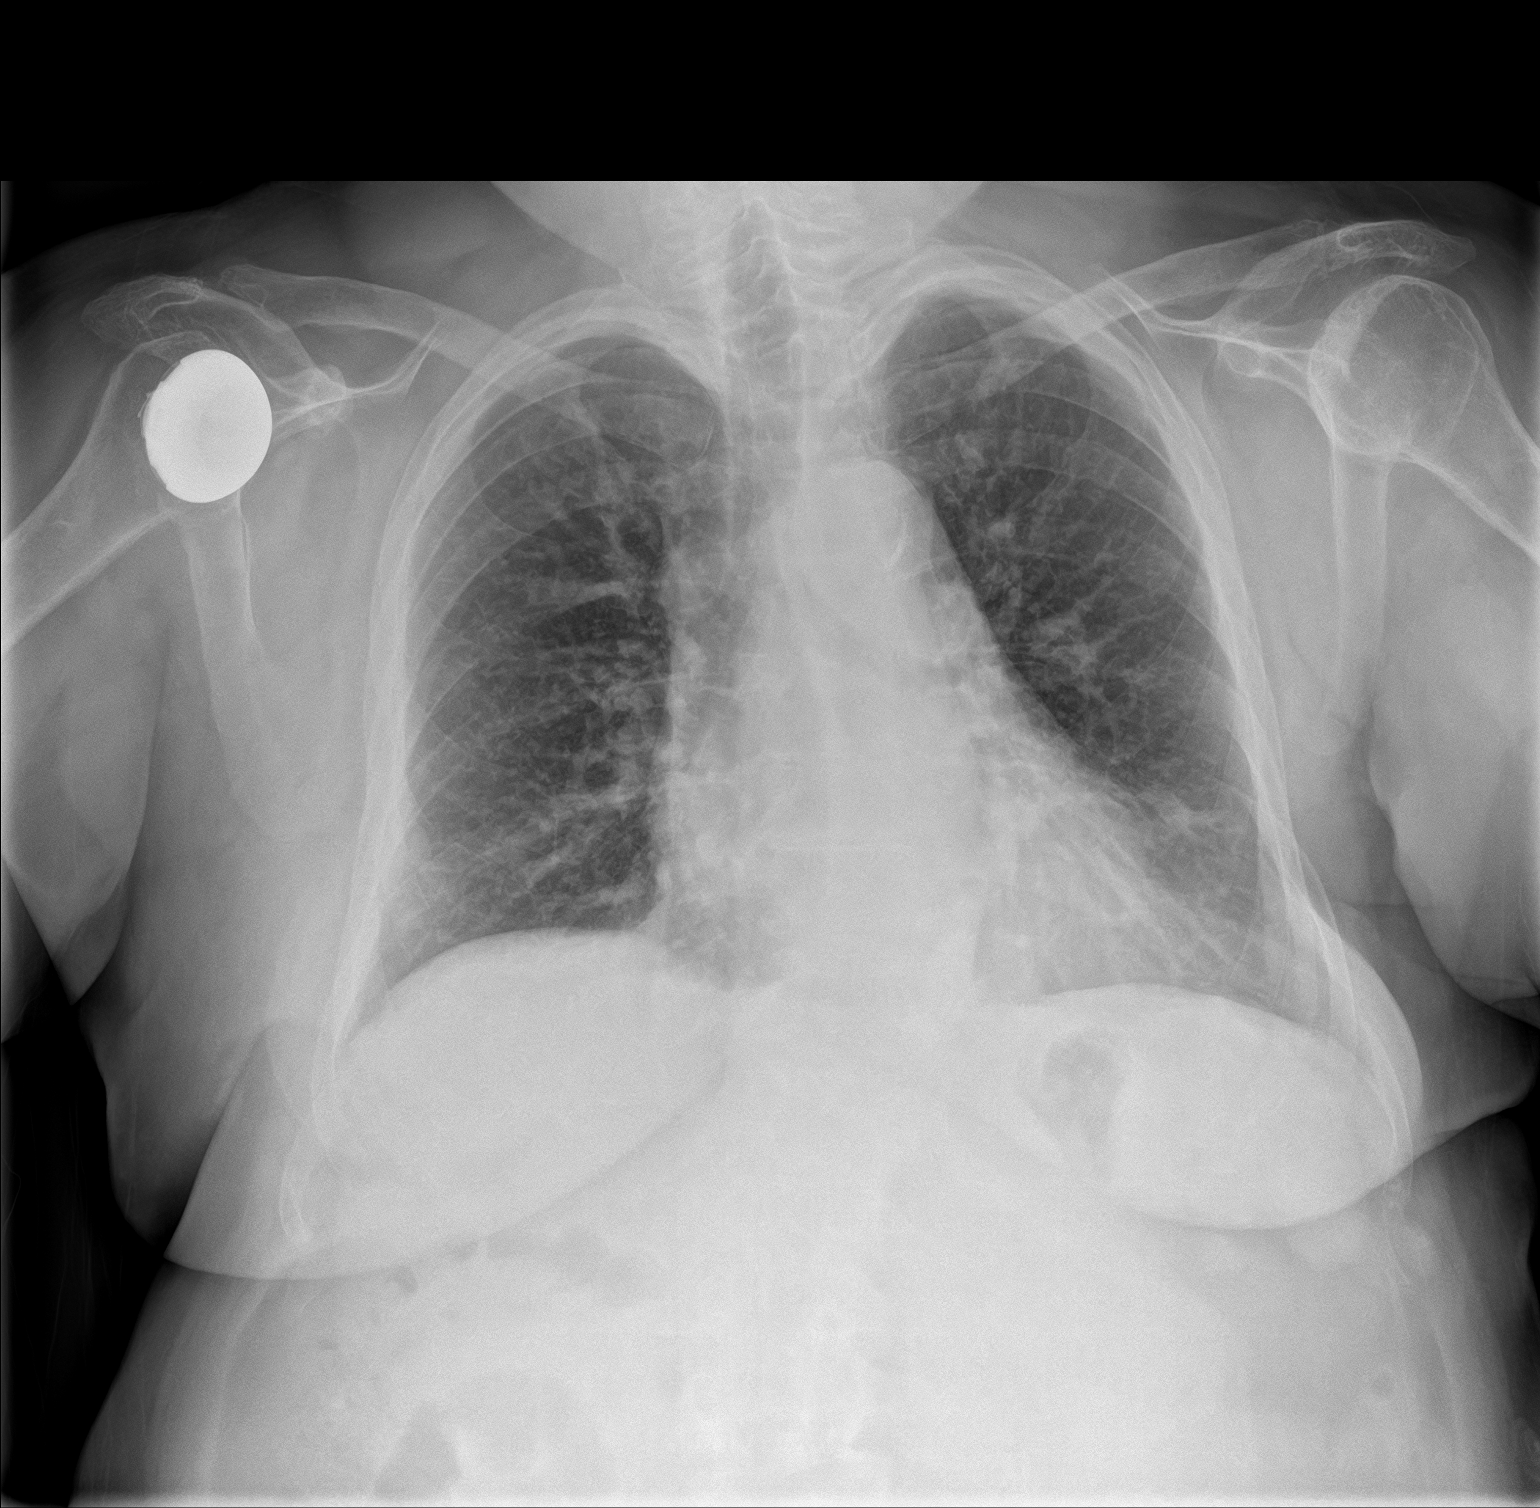
[im 2/2]
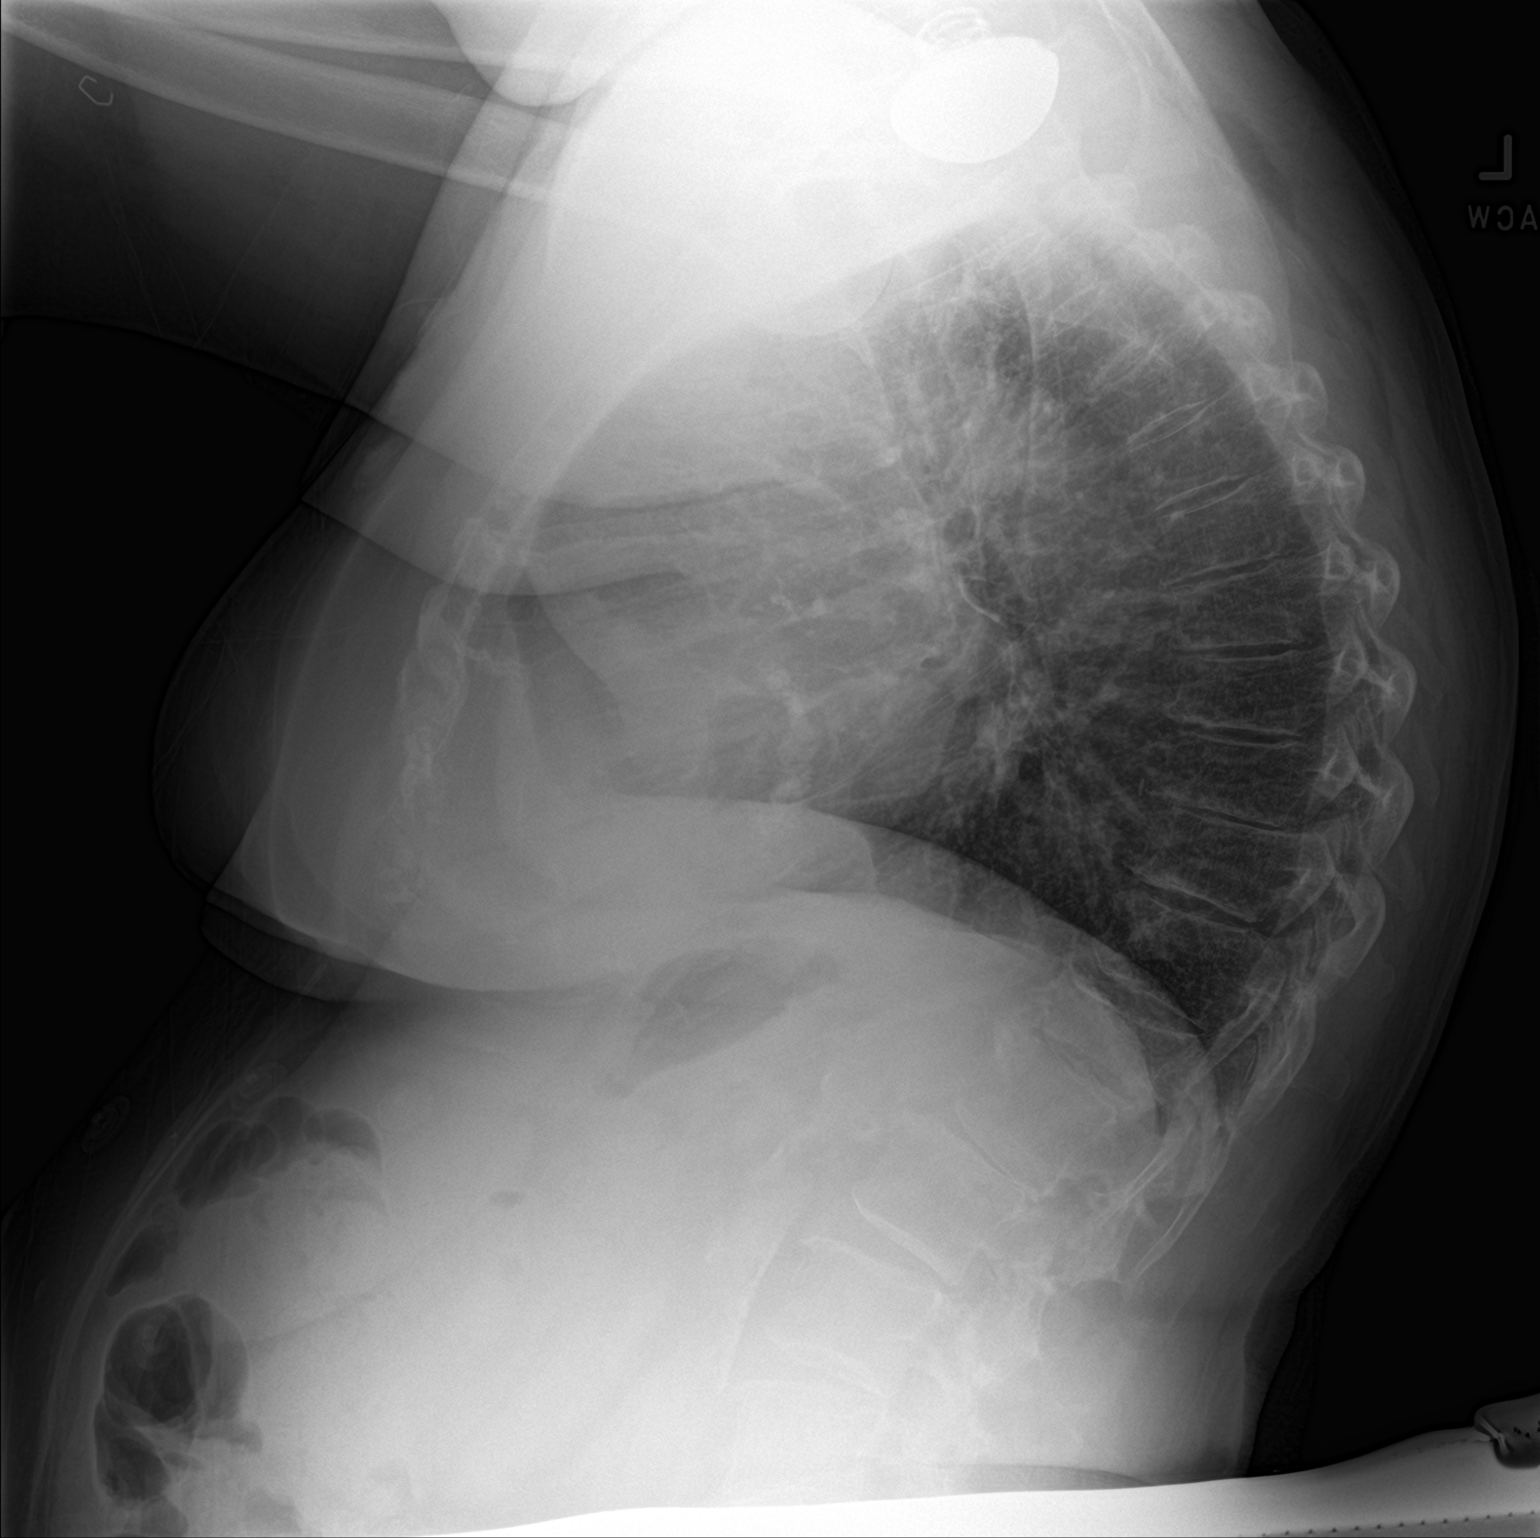

[2 of 2 positions shown; findings below may reference images not displayed]

FINDINGS: The heart is at the upper limits of normal for size. The upper
mediastinal contours are normal.

There is vascular congestion without overt pulmonary edema. There is
no focal consolidation. There is no pleural effusion or
pneumothorax.

There is exaggerated thoracic kyphosis. There is mild compression
deformity of the T12 vertebral body, age-indeterminate. Right
shoulder surgical hardware is noted.
IMPRESSION: 1. Borderline cardiomegaly with vascular congestion but no definite
overt pulmonary edema.
2. Age-indeterminate mild compression deformity of the T12 vertebral
body.

## 2023-09-14 ENCOUNTER — Inpatient Hospital Stay: Payer: Medicare Other | Attending: Oncology | Admitting: Oncology

## 2023-09-14 ENCOUNTER — Inpatient Hospital Stay: Payer: Medicare Other

## 2023-09-14 ENCOUNTER — Encounter: Payer: Self-pay | Admitting: Oncology

## 2023-09-14 VITALS — BP 142/50 | HR 68 | Temp 98.8°F | Resp 17 | Wt 176.9 lb

## 2023-09-14 DIAGNOSIS — I130 Hypertensive heart and chronic kidney disease with heart failure and stage 1 through stage 4 chronic kidney disease, or unspecified chronic kidney disease: Secondary | ICD-10-CM | POA: Insufficient documentation

## 2023-09-14 DIAGNOSIS — D649 Anemia, unspecified: Secondary | ICD-10-CM | POA: Insufficient documentation

## 2023-09-14 DIAGNOSIS — N1832 Chronic kidney disease, stage 3b: Secondary | ICD-10-CM | POA: Diagnosis not present

## 2023-09-14 DIAGNOSIS — G4733 Obstructive sleep apnea (adult) (pediatric): Secondary | ICD-10-CM | POA: Insufficient documentation

## 2023-09-14 DIAGNOSIS — E063 Autoimmune thyroiditis: Secondary | ICD-10-CM | POA: Insufficient documentation

## 2023-09-14 LAB — COMPREHENSIVE METABOLIC PANEL
ALT: 23 U/L (ref 0–44)
AST: 20 U/L (ref 15–41)
Albumin: 4.2 g/dL (ref 3.5–5.0)
Alkaline Phosphatase: 108 U/L (ref 38–126)
Anion gap: 9 (ref 5–15)
BUN: 44 mg/dL — ABNORMAL HIGH (ref 8–23)
CO2: 23 mmol/L (ref 22–32)
Calcium: 9 mg/dL (ref 8.9–10.3)
Chloride: 104 mmol/L (ref 98–111)
Creatinine, Ser: 1.68 mg/dL — ABNORMAL HIGH (ref 0.44–1.00)
GFR, Estimated: 31 mL/min — ABNORMAL LOW (ref >60)
Glucose, Bld: 248 mg/dL — ABNORMAL HIGH (ref 70–99)
Potassium: 4.1 mmol/L (ref 3.5–5.1)
Sodium: 136 mmol/L (ref 135–145)
Total Bilirubin: 0.4 mg/dL (ref 0.3–1.2)
Total Protein: 7.4 g/dL (ref 6.5–8.1)

## 2023-09-14 LAB — CBC WITH DIFFERENTIAL/PLATELET
Abs Immature Granulocytes: 0.18 10*3/uL — ABNORMAL HIGH (ref 0.00–0.07)
Basophils Absolute: 0.1 10*3/uL (ref 0.0–0.1)
Basophils Relative: 1 %
Eosinophils Absolute: 0.1 10*3/uL (ref 0.0–0.5)
Eosinophils Relative: 1 %
HCT: 29 % — ABNORMAL LOW (ref 36.0–46.0)
Hemoglobin: 8.9 g/dL — ABNORMAL LOW (ref 12.0–15.0)
Immature Granulocytes: 2 %
Lymphocytes Relative: 4 %
Lymphs Abs: 0.4 10*3/uL — ABNORMAL LOW (ref 0.7–4.0)
MCH: 27.6 pg (ref 26.0–34.0)
MCHC: 30.7 g/dL (ref 30.0–36.0)
MCV: 89.8 fL (ref 80.0–100.0)
Monocytes Absolute: 0.6 10*3/uL (ref 0.1–1.0)
Monocytes Relative: 7 %
Neutro Abs: 7.5 10*3/uL (ref 1.7–7.7)
Neutrophils Relative %: 85 %
Platelets: 233 10*3/uL (ref 150–400)
RBC: 3.23 MIL/uL — ABNORMAL LOW (ref 3.87–5.11)
RDW: 16.1 % — ABNORMAL HIGH (ref 11.5–15.5)
WBC: 8.9 10*3/uL (ref 4.0–10.5)
nRBC: 0 % (ref 0.0–0.2)

## 2023-09-14 LAB — RETICULOCYTES
Immature Retic Fract: 13 % (ref 2.3–15.9)
RBC.: 3.27 MIL/uL — ABNORMAL LOW (ref 3.87–5.11)
Retic Count, Absolute: 48.4 10*3/uL (ref 19.0–186.0)
Retic Ct Pct: 1.5 % (ref 0.4–3.1)

## 2023-09-14 LAB — TSH: TSH: 1.177 u[IU]/mL (ref 0.350–4.500)

## 2023-09-14 LAB — IRON AND TIBC
Iron: 31 ug/dL (ref 28–170)
Saturation Ratios: 10 % — ABNORMAL LOW (ref 10.4–31.8)
TIBC: 309 ug/dL (ref 250–450)
UIBC: 278 ug/dL

## 2023-09-14 LAB — FERRITIN: Ferritin: 106 ng/mL (ref 11–307)

## 2023-09-14 LAB — FOLATE: Folate: >40 ng/mL (ref >5.9)

## 2023-09-14 LAB — VITAMIN B12: Vitamin B-12: 721 pg/mL (ref 180–914)

## 2023-09-14 NOTE — Progress Notes (Unsigned)
Hematology/Oncology Consult note [facility] [phone] [phone]  Patient Care Team: Darrick Penna, MD as PCP - General (Internal Medicine) Creig Hines, MD as Consulting Physician (Oncology)   Name of the patient: Lindsay Glenn  [id number]  10/13/1946    Reason for referral- anemia   Referring physician- Dr. Sampson Goon Date of visit: 09/14/23   History of presenting illness- patient is a 77 year old female with a past medical history significant for stage III kidney disease hypertension hyperlipidemia obstructive sleep apnea among other medical Problems.  She has been referred for anemia.  CBC from 09/08/2023 showed white count of 8.1, H&H of 9.7/20.9 with an MCV of 89 and a platelet count of 265.  Ferritin levels were normal at 118.  Looking back at her prior CBCs her hemoglobin has been between 11-12 up until January 2024 and subsequently drifted down to the 10's.  Patient has chronic fatigue and exertional shortness of breath.    ECOG PS- 2  Pain scale- 4   Review of systems- Review of Systems  Constitutional:  Positive for malaise/fatigue. Negative for chills, fever and weight loss.  HENT:  Negative for congestion, ear discharge and nosebleeds.   Eyes:  Negative for blurred vision.  Respiratory:  Positive for shortness of breath. Negative for cough, hemoptysis, sputum production and wheezing.   Cardiovascular:  Negative for chest pain, palpitations, orthopnea and claudication.  Gastrointestinal:  Negative for abdominal pain, blood in stool, constipation, diarrhea, heartburn, melena, nausea and vomiting.  Genitourinary:  Negative for dysuria, flank pain, frequency, hematuria and urgency.  Musculoskeletal:  Negative for back pain, joint pain and myalgias.  Skin:  Negative for rash.  Neurological:  Negative for dizziness, tingling, focal weakness, seizures, weakness and headaches.  Endo/Heme/Allergies:  Does not bruise/bleed  easily.  Psychiatric/Behavioral:  Negative for depression and suicidal ideas. The patient does not have insomnia.     Allergies  Allergen Reactions   Empagliflozin Nausea And Vomiting and Other (See Comments)    Decreased energy (Jardiance)   Tape Other (See Comments)    Causes rash, blistering( if on for several days)   Paper tape and tegaderm  is OK   Atorvastatin Other (See Comments)    Increases blood sugar   Furosemide Itching and Rash   Lisinopril Cough   Rosuvastatin Other (See Comments)    Increases blood sugar   Trulicity [Dulaglutide] Diarrhea and Nausea And Vomiting    Patient Active Problem List   Diagnosis Date Noted   Acute CVA (cerebrovascular accident) (HCC) 11/21/2022   Chronic kidney disease 11/21/2022   Osteoporosis 11/21/2022   Productive cough 11/21/2022   Status post total shoulder arthroplasty, right 09/05/2021   Mild aortic stenosis 08/23/2021   Major depressive disorder, recurrent, 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/17/2021   Iron deficiency 09/18/2020   OSA (obstructive sleep apnea) 05/31/2020   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 b.) TTE 08/26/2021: EF >55%; mild BAE   Aortic stenosis  a.) TTE 02/07/2020: EF 55%; trivial MR; mild AS with MPG 8 mmHg. b.) TTE 08/26/2021: EF >55%; mild MR/TR/PR; mild BAE; mild AS with MPG 21 mmHg.   Arthritis    BILATERAL rotator cuff tendinitis    CHF (congestive heart failure)  (HCC)    CKD (chronic kidney disease), stage III (HCC)    Complication of anesthesia    a.) (+) aspiration even during colonoscopy   CTS (carpal tunnel syndrome)    Depression    DOE (dyspnea on exertion)    Dyspnea    Gastroparesis    GERD (gastroesophageal reflux disease)    Hashimoto's thyroiditis    Heart murmur    History of hiatal hernia    HLD (hyperlipidemia)    Hypertension    IDA (iron deficiency anemia)    Insomnia    LADA (latent autoimmune diabetes in adults), managed as type 1 (HCC)    a.) uses insulin pump   Obesity    Osteopenia    Osteoporosis    Pancreatic cyst    Pneumonia    PONV (postoperative nausea and vomiting)    Pseudophakia of both eyes    Renal cyst, right    Restless leg syndrome    Sleep apnea    a.) unable to tolerate nocturnal PAP therapy   Stroke (HCC) 2017   field of vision lower on right eye   TIA (transient ischemic attack) 2017     Past Surgical History:  Procedure Laterality Date   ABDOMINAL HYSTERECTOMY  1994   ANKLE FRACTURE SURGERY Right 2004   screws;plate   BRACHIOPLASTY Bilateral 2018   fat removal from both upper arms   BREAST SURGERY  2000   bilateral mastectomy   CARPAL TUNNEL RELEASE Right 2004   CARPAL TUNNEL RELEASE Right 04/04/2020   Procedure: OPEN CARPAL TUNNEL RELEASE;  Surgeon: Christena Flake, MD;  Location: [facility] ORS;  Service: Orthopedics;  Laterality: Right;   COLONOSCOPY     FRACTURE SURGERY     JOINT REPLACEMENT Bilateral    knee   MASTECTOMY     SHOULDER ARTHROSCOPY Right 2016   TOTAL SHOULDER ARTHROPLASTY Right 09/05/2021   Procedure: TOTAL SHOULDER ARTHROPLASTY;  Surgeon: Christena Flake, MD;  Location: [facility] OR[facility];  Service: Orthopedics;  Laterality: Right;   UPPER ESOPHAGEAL ENDOSCOPIC ULTRASOUND (EUS)  2020   pancreas    Social History   Socioeconomic History   Marital status: Married    Spouse name: [name]   Number of children: Not on file   Years of education: Not on file   Highest education  level: Not on file  Occupational History   Not on file  Tobacco Use   Smoking status: Never   Smokeless tobacco: Never  Vaping Use   Vaping status: Never Used  Substance and Sexual Activity   Alcohol use: Yes    Comment: rarely   Drug use: Never   Sexual activity: Not on file  Other Topics Concern   Not on file  Social History Narrative   Not on file   Social Determinants of Health   Financial Resource Strain: Low Risk  (09/08/2023)   Received from [facility]   Overall Financial Resource Strain (CARDIA)    Difficulty of Paying Living Expenses: Not hard at all  Food Insecurity: No Food Insecurity (09/14/2023)   Hunger Vital Sign    Worried About Running Out of Food in the Last Year: Never true    Ran Out of Food  in the Last Year: Never true  Transportation Needs: No Transportation Needs (09/14/2023)   PRAPARE - Administrator, Civil Service (Medical): No    Lack of Transportation (Non-Medical): No  Physical Activity: Insufficiently Active (07/17/2021)   Received from [facility], [facility]   Exercise Vital Sign    Days of Exercise per Week: 1 day    Minutes of Exercise per Session: 60 min  Stress: Stress Concern Present (01/18/2021)   Received from [facility], [facility]   [employer] of Occupational Health - Occupational Stress Questionnaire    Feeling of Stress : Rather much  Social Connections: Socially Integrated (07/10/2020)   Received from [facility], [facility]   Social Connection and Isolation Panel [NHANES]    Frequency of Communication with Friends and Family: Three times a week    Frequency of Social Gatherings with Friends and Family: Three times a week    Attends Religious Services: More than 4 times per year    Active Member of Clubs or Organizations: Yes    Attends Banker Meetings: More than 4  times per year    Marital Status: Married  Catering manager Violence: Not At Risk (09/14/2023)   Humiliation, Afraid, Rape, and Kick questionnaire    Fear of Current or Ex-Partner: No    Emotionally Abused: No    Physically Abused: No    Sexually Abused: No     Family History  Problem Relation Age of Onset   Alzheimer's disease Mother    Cancer Father      Current Outpatient Medications:    amLODipine (NORVASC) 10 MG tablet, Take 10 mg by mouth daily., Disp: , Rfl:    aspirin EC 81 MG tablet, Take by mouth., Disp: , Rfl:    azelastine (OPTIVAR) 0.05 % ophthalmic solution, Apply to eye., Disp: , Rfl:    b complex vitamins tablet, Take 1 tablet by mouth daily., Disp: , Rfl:    Biotin 1 MG CAPS, Take 1 capsule by mouth daily., Disp: , Rfl:    bumetanide (BUMEX) 0.5 MG tablet, Take 1 tablet (0.5 mg total) by mouth daily., Disp: 30 tablet, Rfl: 0   Cholecalciferol (VITAMIN D) 50 MCG (2000 UT) tablet, Take 2,000 Units by mouth daily., Disp: , Rfl:    clobetasol ointment (TEMOVATE) 0.05 %, Apply topically., Disp: , Rfl:    ezetimibe (ZETIA) 10 MG tablet, Take 10 mg by mouth daily., Disp: , Rfl:    hydrOXYzine (ATARAX) 10 MG tablet, Take by mouth., Disp: , Rfl:    insulin aspart (NOVOLOG) 100 UNIT/ML injection, Inject 70-80 Units into the skin daily. Via OmniPod Insulin pump, Disp: , Rfl:    losartan (COZAAR) 100 MG tablet, Take 100 mg by mouth at bedtime., Disp: , Rfl:    Multiple Vitamins-Minerals (MULTIVITAMIN WITH MINERALS) tablet, Take 1 tablet by mouth daily., Disp: , Rfl:    pravastatin (PRAVACHOL) 80 MG tablet, Take 80 mg by mouth at bedtime., Disp: , Rfl:    rOPINIRole (REQUIP) 2 MG tablet, Take 2 mg by mouth at bedtime., Disp: , Rfl:    ALPRAZolam (XANAX) 0.5 MG tablet, Take 0.5 mg by mouth daily. (Patient not taking: Reported on 09/14/2023), Disp: , Rfl:    cephALEXin (KEFLEX) 500 MG capsule, Take 500 mg by mouth as needed. For dental appointments (Patient not taking: Reported on  09/14/2023), Disp: , Rfl:    clopidogrel (PLAVIX)  75 MG tablet, Take 1 tablet (75 mg total) by mouth daily., Disp: 30 tablet, Rfl: 11   escitalopram (LEXAPRO) 10 MG tablet, Take 10 mg by mouth daily., Disp: , Rfl:    hydrALAZINE (APRESOLINE) 10 MG tablet, Take 5 mg by mouth as needed., Disp: , Rfl:    lamoTRIgine (LAMICTAL) 200 MG tablet, Take 400 mg by mouth daily. At bedtime, Disp: , Rfl:    omeprazole (PRILOSEC) 20 MG capsule, Take 20 mg by mouth as needed. (Patient not taking: Reported on 09/14/2023), Disp: , Rfl:    predniSONE (DELTASONE) 50 MG tablet, Take 1 tablet (50 mg total) by mouth daily with breakfast. Risk of hyperglycemia, monitor blood sugar closely, Disp: 5 tablet, Rfl: 0   Physical exam:  Vitals:   09/14/23 1447 09/14/23 1451  BP: (!) 141/48 (!) 142/50  Pulse: 68   Resp: 17   Temp: 98.8 F (37.1 C)   TempSrc: Tympanic   SpO2: 99%   Weight: 176 lb 14.4 oz (80.2 kg)    Physical Exam Constitutional:      Comments: Sitting in a wheelchair and appears in no acute distress  Cardiovascular:     Rate and Rhythm: Normal rate and regular rhythm.     Heart sounds: Normal heart sounds.  Pulmonary:     Effort: Pulmonary effort is normal.     Breath sounds: Normal breath sounds.  Abdominal:     General: Bowel sounds are normal.     Palpations: Abdomen is soft.  Skin:    General: Skin is warm and dry.  Neurological:     Mental Status: She is alert and oriented to person, place, and time.           Latest Ref Rng & Units 11/22/2022    5:45 AM  CMP  Glucose 70 - 99 mg/dL 347   BUN 8 - 23 mg/dL 29   Creatinine 4.25 - 1.00 mg/dL 9.56   Sodium 387 - 564 mmol/L 137   Potassium 3.5 - 5.1 mmol/L 4.1   Chloride 98 - 111 mmol/L 102   CO2 22 - 32 mmol/L 26   Calcium 8.9 - 10.3 mg/dL 8.6       Latest Ref Rng & Units 11/22/2022    5:45 AM  CBC  WBC 4.0 - 10.5 K/uL 7.0   Hemoglobin 12.0 - 15.0 g/dL 33.2   Hematocrit 95.1 - 46.0 % 32.1   Platelets 150 - 400 K/uL 272      Assessment and plan- Patient is a 77 y.o. female referred for normocytic anemia  Anemia probably secondary to chronic kidney disease.  I will do a complete anemia workup today including CBC CMP myeloma panel, serum free light chains, ferritin and iron studies B12 folate TSH haptoglobin and reticulocyte count.  In person or video visit with me in 2 weeks time.  With her history of kidney disease I would like to keep her ferritin close 200 and iron saturation more than 20%.  If her hemoglobin drifts down to less than 9 I will consider initiating EPO at that time.   Thank you for this kind referral and the opportunity to participate in the care of this patient   Visit Diagnosis 1. Normocytic anemia     Dr. Owens Shark, MD, MPH [facility] [phone] 09/14/2023

## 2023-09-15 LAB — KAPPA/LAMBDA LIGHT CHAINS
Kappa free light chain: 47.3 mg/L — ABNORMAL HIGH (ref 3.3–19.4)
Kappa, lambda light chain ratio: 1.71 — ABNORMAL HIGH (ref 0.26–1.65)
Lambda free light chains: 27.6 mg/L — ABNORMAL HIGH (ref 5.7–26.3)

## 2023-09-15 LAB — HAPTOGLOBIN: Haptoglobin: 164 mg/dL (ref 42–346)

## 2023-09-17 LAB — MULTIPLE MYELOMA PANEL, SERUM
Albumin SerPl Elph-Mcnc: 3.9 g/dL (ref 2.9–4.4)
Albumin/Glob SerPl: 1.4 (ref 0.7–1.7)
Alpha 1: 0.3 g/dL (ref 0.0–0.4)
Alpha2 Glob SerPl Elph-Mcnc: 0.8 g/dL (ref 0.4–1.0)
B-Globulin SerPl Elph-Mcnc: 0.9 g/dL (ref 0.7–1.3)
Gamma Glob SerPl Elph-Mcnc: 1.1 g/dL (ref 0.4–1.8)
Globulin, Total: 3 g/dL (ref 2.2–3.9)
IgA: 210 mg/dL (ref 64–422)
IgG (Immunoglobin G), Serum: 1026 mg/dL (ref 586–1602)
IgM (Immunoglobulin M), Srm: 166 mg/dL (ref 26–217)
M Protein SerPl Elph-Mcnc: 0.4 g/dL — ABNORMAL HIGH
Total Protein ELP: 6.9 g/dL (ref 6.0–8.5)

## 2023-09-28 ENCOUNTER — Inpatient Hospital Stay: Payer: Medicare Other | Attending: Oncology | Admitting: Oncology

## 2023-09-28 DIAGNOSIS — N183 Chronic kidney disease, stage 3 unspecified: Secondary | ICD-10-CM | POA: Diagnosis not present

## 2023-09-28 DIAGNOSIS — D631 Anemia in chronic kidney disease: Secondary | ICD-10-CM | POA: Insufficient documentation

## 2023-09-28 DIAGNOSIS — I130 Hypertensive heart and chronic kidney disease with heart failure and stage 1 through stage 4 chronic kidney disease, or unspecified chronic kidney disease: Secondary | ICD-10-CM | POA: Insufficient documentation

## 2023-09-28 DIAGNOSIS — N1832 Chronic kidney disease, stage 3b: Secondary | ICD-10-CM | POA: Insufficient documentation

## 2023-09-28 DIAGNOSIS — E611 Iron deficiency: Secondary | ICD-10-CM | POA: Insufficient documentation

## 2023-09-29 ENCOUNTER — Other Ambulatory Visit: Payer: Self-pay | Admitting: Oncology

## 2023-09-29 ENCOUNTER — Encounter: Payer: Self-pay | Admitting: Oncology

## 2023-09-29 NOTE — Progress Notes (Signed)
I connected with Lindsay Glenn on 09/29/23 at  3:00 PM EST by video enabled telemedicine visit and verified that I am speaking with the correct person using two identifiers.   I discussed the limitations, risks, security and privacy concerns of performing an evaluation and management service by telemedicine and the availability of in-person appointments. I also discussed with the patient that there may be a patient responsible charge related to this service. The patient expressed understanding and agreed to proceed.  Other persons participating in the visit and their role in the encounter:  none  Patient's location:  home Provider's location:  work  Stage manager Complaint:  discuss results of bloodwork  History of present illness: patient is a 77 year old female with a past medical history significant for stage III kidney disease hypertension hyperlipidemia obstructive sleep apnea among other medical Problems.  She has been referred for anemia.  CBC from 09/08/2023 showed white count of 8.1, H&H of 9.7/20.9 with an MCV of 89 and a platelet count of 265.  Ferritin levels were normal at 118.  Looking back at her prior CBCs her hemoglobin has been between 11-12 up until January 2024 and subsequently drifted down to the 10's.   Patient has chronic fatigue and exertional shortness of breath.     Results of blood work from 09/14/2023 were as follows: CBC showed white cell count of 8.9, H&H of 8.9/29 with a platelet count of 233.  B12 levels were normal at 721.  Ferritin levels were normal at 106 but iron saturation was low at 10%.  Folate and TSH normal.  Haptoglobin normal.  Myeloma panel showed a small amount of IgG M lambda M protein of 0.4 g.  Both kappa and lambda free light chains were elevated with a free light chain ratio that was mildly abnormal at 1.7  Interval history patient reports chronic fatigue.  Denies any new complaints at this time   Review of Systems  Constitutional:  Positive for  malaise/fatigue. Negative for chills, fever and weight loss.  HENT:  Negative for congestion, ear discharge and nosebleeds.   Eyes:  Negative for blurred vision.  Respiratory:  Negative for cough, hemoptysis, sputum production, shortness of breath and wheezing.   Cardiovascular:  Negative for chest pain, palpitations, orthopnea and claudication.  Gastrointestinal:  Negative for abdominal pain, blood in stool, constipation, diarrhea, heartburn, melena, nausea and vomiting.  Genitourinary:  Negative for dysuria, flank pain, frequency, hematuria and urgency.  Musculoskeletal:  Negative for back pain, joint pain and myalgias.  Skin:  Negative for rash.  Neurological:  Negative for dizziness, tingling, focal weakness, seizures, weakness and headaches.  Endo/Heme/Allergies:  Does not bruise/bleed easily.  Psychiatric/Behavioral:  Negative for depression and suicidal ideas. The patient does not have insomnia.     Allergies  Allergen Reactions   Empagliflozin Nausea And Vomiting and Other (See Comments)    Decreased energy (Jardiance)   Tape Other (See Comments)    Causes rash, blistering( if on for several days)   Paper tape and tegaderm  is OK   Atorvastatin Other (See Comments)    Increases blood sugar   Furosemide Itching and Rash   Lisinopril Cough   Rosuvastatin Other (See Comments)    Increases blood sugar   Trulicity [Dulaglutide] Diarrhea and Nausea And Vomiting    Past Medical History:  Diagnosis Date   (HFpEF) heart failure with preserved ejection fraction (HCC)    a.) TTE 02/07/2020: EF 55%. b.) TTE 08/26/2021: EF >55%; mild BAE  Aortic stenosis    a.) TTE 02/07/2020: EF 55%; trivial MR; mild AS with MPG 8 mmHg. b.) TTE 08/26/2021: EF >55%; mild MR/TR/PR; mild BAE; mild AS with MPG 21 mmHg.   Arthritis    BILATERAL rotator cuff tendinitis    CHF (congestive heart failure) (HCC)    CKD (chronic kidney disease), stage III (HCC)    Complication of anesthesia    a.) (+)  aspiration even during colonoscopy   CTS (carpal tunnel syndrome)    Depression    DOE (dyspnea on exertion)    Dyspnea    Gastroparesis    GERD (gastroesophageal reflux disease)    Hashimoto's thyroiditis    Heart murmur    History of hiatal hernia    HLD (hyperlipidemia)    Hypertension    IDA (iron deficiency anemia)    Insomnia    LADA (latent autoimmune diabetes in adults), managed as type 1 (HCC)    a.) uses insulin pump   Obesity    Osteopenia    Osteoporosis    Pancreatic cyst    Pneumonia    PONV (postoperative nausea and vomiting)    Pseudophakia of both eyes    Renal cyst, right    Restless leg syndrome    Sleep apnea    a.) unable to tolerate nocturnal PAP therapy   Stroke (HCC) 2017   field of vision lower on right eye   TIA (transient ischemic attack) 2017    Past Surgical History:  Procedure Laterality Date   ABDOMINAL HYSTERECTOMY  1994   ANKLE FRACTURE SURGERY Right 2004   screws;plate   BRACHIOPLASTY Bilateral 2018   fat removal from both upper arms   BREAST SURGERY  2000   bilateral mastectomy   CARPAL TUNNEL RELEASE Right 2004   CARPAL TUNNEL RELEASE Right 04/04/2020   Procedure: OPEN CARPAL TUNNEL RELEASE;  Surgeon: Christena Flake, MD;  Location: [facility];  Service: Orthopedics;  Laterality: Right;   COLONOSCOPY     FRACTURE SURGERY     JOINT REPLACEMENT Bilateral    knee   MASTECTOMY     SHOULDER ARTHROSCOPY Right 2016   TOTAL SHOULDER ARTHROPLASTY Right 09/05/2021   Procedure: TOTAL SHOULDER ARTHROPLASTY;  Surgeon: Christena Flake, MD;  Location: [facility] OR[facility];  Service: Orthopedics;  Laterality: Right;   UPPER ESOPHAGEAL ENDOSCOPIC ULTRASOUND (EUS)  2020   pancreas    Social History   Socioeconomic History   Marital status: Married    Spouse name: Lindsay Glenn   Number of children: Not on file   Years of education: Not on file   Highest education level: Not on file  Occupational History   Not on file  Tobacco Use   Smoking status: Never    Smokeless tobacco: Never  Vaping Use   Vaping status: Never Used  Substance and Sexual Activity   Alcohol use: Yes    Comment: rarely   Drug use: Never   Sexual activity: Not on file  Other Topics Concern   Not on file  Social History Narrative   Not on file   Social Determinants of Health   Financial Resource Strain: Low Risk  (09/08/2023)   Received from [facility]   Overall Financial Resource Strain (CARDIA)    Difficulty of Paying Living Expenses: Not hard at all  Food Insecurity: No Food Insecurity (09/14/2023)   Hunger Vital Sign    Worried About Running Out of Food in the Last Year: Never true  Ran Out of Food in the Last Year: Never true  Transportation Needs: No Transportation Needs (09/14/2023)   PRAPARE - Administrator, Civil Service (Medical): No    Lack of Transportation (Non-Medical): No  Physical Activity: Insufficiently Active (07/17/2021)   Received from [facility], [facility]   Exercise Vital Sign    Days of Exercise per Week: 1 day    Minutes of Exercise per Session: 60 min  Stress: Stress Concern Present (01/18/2021)   Received from [facility], [facility]   [employer] of Occupational Health - Occupational Stress Questionnaire    Feeling of Stress : Rather much  Social Connections: Socially Integrated (07/10/2020)   Received from [facility], [facility]   Social Connection and Isolation Panel [NHANES]    Frequency of Communication with Friends and Family: Three times a week    Frequency of Social Gatherings with Friends and Family: Three times a week    Attends Religious Services: More than 4 times per year    Active Member of Clubs or Organizations: Yes    Attends Banker Meetings: More than 4 times per year    Marital Status: Married  Catering manager Violence: Not At Risk (09/14/2023)    Humiliation, Afraid, Rape, and Kick questionnaire    Fear of Current or Ex-Partner: No    Emotionally Abused: No    Physically Abused: No    Sexually Abused: No    Family History  Problem Relation Age of Onset   Alzheimer's disease Mother    Cancer Father      Current Outpatient Medications:    ALPRAZolam (XANAX) 0.5 MG tablet, Take 0.5 mg by mouth daily., Disp: , Rfl:    aspirin EC 81 MG tablet, Take by mouth., Disp: , Rfl:    azelastine (OPTIVAR) 0.05 % ophthalmic solution, Apply to eye., Disp: , Rfl:    b complex vitamins tablet, Take 1 tablet by mouth daily., Disp: , Rfl:    Biotin 1 MG CAPS, Take 1 capsule by mouth daily., Disp: , Rfl:    bumetanide (BUMEX) 1 MG tablet, Take 1 mg by mouth daily., Disp: , Rfl:    bumetanide (BUMEX) 2 MG tablet, Take by mouth daily., Disp: , Rfl:    Cholecalciferol (VITAMIN D) 50 MCG (2000 UT) tablet, Take 2,000 Units by mouth daily., Disp: , Rfl:    clobetasol ointment (TEMOVATE) 0.05 %, Apply topically., Disp: , Rfl:    ezetimibe (ZETIA) 10 MG tablet, Take 10 mg by mouth daily., Disp: , Rfl:    hydrOXYzine (ATARAX) 10 MG tablet, Take by mouth., Disp: , Rfl:    insulin aspart (NOVOLOG) 100 UNIT/ML injection, Inject 70-80 Units into the skin daily. Via OmniPod Insulin pump, Disp: , Rfl:    losartan (COZAAR) 100 MG tablet, Take 100 mg by mouth at bedtime., Disp: , Rfl:    Multiple Vitamins-Minerals (MULTIVITAMIN WITH MINERALS) tablet, Take 1 tablet by mouth daily., Disp: , Rfl:    pantoprazole (PROTONIX) 40 MG tablet, Take by mouth., Disp: , Rfl:    pravastatin (PRAVACHOL) 80 MG tablet, Take 80 mg by mouth at bedtime., Disp: , Rfl:    rOPINIRole (REQUIP) 4 MG tablet, Take 4 mg by mouth at bedtime., Disp: , Rfl:    amLODipine (NORVASC) 10 MG tablet, Take 10 mg by mouth daily., Disp: , Rfl:    bumetanide (BUMEX) 0.5 MG tablet, Take 1 tablet (  0.5 mg total) by mouth daily., Disp: 30 tablet, Rfl: 0   cephALEXin (KEFLEX) 500 MG capsule, Take 500 mg by  mouth as needed. For dental appointments (Patient not taking: Reported on 09/14/2023), Disp: , Rfl:    clopidogrel (PLAVIX) 75 MG tablet, Take 1 tablet (75 mg total) by mouth daily., Disp: 30 tablet, Rfl: 11   escitalopram (LEXAPRO) 10 MG tablet, Take 10 mg by mouth daily., Disp: , Rfl:    hydrALAZINE (APRESOLINE) 100 MG tablet, Take 100 mg by mouth 2 (two) times daily., Disp: , Rfl:    lamoTRIgine (LAMICTAL) 200 MG tablet, Take 400 mg by mouth daily. At bedtime, Disp: , Rfl:    omeprazole (PRILOSEC) 20 MG capsule, Take 20 mg by mouth as needed. (Patient not taking: Reported on 09/14/2023), Disp: , Rfl:    predniSONE (DELTASONE) 50 MG tablet, Take 1 tablet (50 mg total) by mouth daily with breakfast. Risk of hyperglycemia, monitor blood sugar closely, Disp: 5 tablet, Rfl: 0   rOPINIRole (REQUIP) 2 MG tablet, Take 2 mg by mouth at bedtime., Disp: , Rfl:   No results found.  No images are attached to the encounter.      Latest Ref Rng & Units 09/14/2023    3:38 PM  CMP  Glucose 70 - 99 mg/dL 161   BUN 8 - 23 mg/dL 44   Creatinine 0.96 - 1.00 mg/dL 0.45   Sodium 409 - 811 mmol/L 136   Potassium 3.5 - 5.1 mmol/L 4.1   Chloride 98 - 111 mmol/L 104   CO2 22 - 32 mmol/L 23   Calcium 8.9 - 10.3 mg/dL 9.0   Total Protein 6.5 - 8.1 g/dL 7.4   Total Bilirubin 0.3 - 1.2 mg/dL 0.4   Alkaline Phos 38 - 126 U/L 108   AST 15 - 41 U/L 20   ALT 0 - 44 U/L 23       Latest Ref Rng & Units 09/14/2023    3:38 PM  CBC  WBC 4.0 - 10.5 K/uL 8.9   Hemoglobin 12.0 - 15.0 g/dL 8.9   Hematocrit 91.4 - 46.0 % 29.0   Platelets 150 - 400 K/uL 233      Observation/objective: Appears in no acute distress over video visit today.  Breathing is not  Assessment and plan: Patient is a 77 year old female referred for anemia of chronic kidney disease  Patient's hemoglobin which was close to 11-12 in January 2024 has gradually drifted down and is presently at 8.9.  Although ferritin levels are greater than 100  iron saturation is less than 20% and therefore it would be reasonable to offer her a trial of IV iron given that she has chronic kidney disease before considering EPO.  Discussed risks and benefits of IV iron including all but not limited to possible risk of infusion reactions.  Which brand of IV iron she can get will depend on her insurance  Follow-up instructions: CBC ferritin and iron studies in 2 and 4 months and I will see her back in 4 months  I discussed the assessment and treatment plan with the patient. The patient was provided an opportunity to ask questions and all were answered. The patient agreed with the plan and demonstrated an understanding of the instructions.   The patient was advised to call back or seek an in-person evaluation if the symptoms worsen or if the condition fails to improve as anticipated.  I provided 11 minutes of face-to-face video visit time during this encounter,  and > 50% was spent counseling as documented under my assessment & plan.  Visit Diagnosis: 1. Anemia of chronic kidney failure, stage 3 (moderate) (HCC)     Dr. Owens Shark, MD, MPH [facility] Tel- [phone] 09/29/2023 8:37 AM

## 2023-10-06 ENCOUNTER — Ambulatory Visit: Payer: BLUE CROSS/BLUE SHIELD

## 2023-10-07 ENCOUNTER — Inpatient Hospital Stay: Payer: Medicare Other

## 2023-10-07 ENCOUNTER — Encounter: Payer: Medicare Other | Attending: Endocrinology | Admitting: Dietician

## 2023-10-07 VITALS — BP 138/81 | HR 67 | Temp 97.4°F | Resp 16

## 2023-10-07 VITALS — Ht 60.0 in | Wt 175.4 lb

## 2023-10-07 DIAGNOSIS — E611 Iron deficiency: Secondary | ICD-10-CM | POA: Diagnosis present

## 2023-10-07 DIAGNOSIS — N1832 Chronic kidney disease, stage 3b: Secondary | ICD-10-CM | POA: Diagnosis present

## 2023-10-07 DIAGNOSIS — E119 Type 2 diabetes mellitus without complications: Secondary | ICD-10-CM | POA: Insufficient documentation

## 2023-10-07 DIAGNOSIS — Z713 Dietary counseling and surveillance: Secondary | ICD-10-CM | POA: Insufficient documentation

## 2023-10-07 DIAGNOSIS — E1322 Other specified diabetes mellitus with diabetic chronic kidney disease: Secondary | ICD-10-CM

## 2023-10-07 DIAGNOSIS — I130 Hypertensive heart and chronic kidney disease with heart failure and stage 1 through stage 4 chronic kidney disease, or unspecified chronic kidney disease: Secondary | ICD-10-CM | POA: Diagnosis present

## 2023-10-07 DIAGNOSIS — D631 Anemia in chronic kidney disease: Secondary | ICD-10-CM | POA: Diagnosis not present

## 2023-10-07 MED ORDER — SODIUM CHLORIDE 0.9 % IV SOLN
Freq: Once | INTRAVENOUS | Status: AC
  Filled 2023-10-07: qty 250

## 2023-10-07 MED ORDER — SODIUM CHLORIDE 0.9 % IV SOLN
1000.0000 mg | Freq: Once | INTRAVENOUS | Status: AC
  Administered 2023-10-07: 1000 mg via INTRAVENOUS
  Filled 2023-10-07: qty 1000

## 2023-10-07 NOTE — Patient Instructions (Signed)
Give bolus doses about 15 minutes before meals.  Measure portions of carb foods with measuring cups or using hand to estimate. Aim for 30-45 grams of carb with meals.  Use food list resources, food labels, or online search Keep a diary of what and how much is eaten along with the carb grams, and send 4-7 days worth to [facility] for evaluation. Contact omnipod rep/ trainer for help with adjusting basal rates, bolus rates, and/or extended bolus if needed.

## 2023-10-07 NOTE — Progress Notes (Signed)
Diabetes Self-Management Education  Visit Type: First/Initial  Appt. Start Time: 1040 Appt. End Time: 1220  10/07/2023  Ms. Lindsay Glenn, identified by name and date of birth, is a 77 y.o. female with a diagnosis of Diabetes: Type 1 (LADA).   ASSESSMENT  Height 5' (1.524 m), weight 175 lb 6.4 oz (79.6 kg). Body mass index is 34.26 kg/m.   Diabetes Self-Management Education - 10/07/23 1104       Visit Information   Visit Type First/Initial      Initial Visit   Diabetes Type Type 1   LADA   Date Diagnosed 2017    Are you currently following a meal plan? No    Are you taking your medications as prescribed? Yes      Health Coping   How would you rate your overall health? Fair      Psychosocial Assessment   Patient Belief/Attitude about Diabetes Defeat/Burnout    How often do you need to have someone help you when you read instructions, pamphlets, or other written materials from your doctor or pharmacy? 1 - Never    What is the last grade level you completed in school? 15      Pre-Education Assessment   Patient understands the diabetes disease and treatment process. Comprehends key points    Patient understands incorporating nutritional management into lifestyle. Comprehends key points    Patient undertands incorporating physical activity into lifestyle. Needs Review    Patient understands using medications safely. Comprehends key points    Patient understands monitoring blood glucose, interpreting and using results Comprehends key points    Patient understands prevention, detection, and treatment of acute complications. Comprehends key points    Patient understands prevention, detection, and treatment of chronic complications. Compreheands key points    Patient understands how to develop strategies to address psychosocial issues. Comprehends key points    Patient understands how to develop strategies to promote health/change behavior. Comprehends key points      Complications    Last HgB A1C per patient/outside source 7.4 %   07/21/23   How often do you check your blood sugar? > 4 times/day   on hold for past several days; waiting on shipment of supplies   Fasting Blood glucose range (mg/dL) 62-952;<84    Postprandial Blood glucose range (mg/dL) >132;440-102;725-366;<44;03-474    Number of hypoglycemic episodes per month --   reports no episodes in recent weeks, but has hypoglycemia unawareness   Have you had a dilated eye exam in the past 12 months? Yes    Have you had a dental exam in the past 12 months? Yes    Are you checking your feet? Yes    How many days per week are you checking your feet? 2      Dietary Intake   Breakfast sometimes skips; eggs and bacon; waffle or pancakes; smoothie with protein shake    Snack (morning) none    Lunch grilled cheese sandwich + fruit; salad from bag with boiled egg or meat + extra tomato; soup    Snack (afternoon) occ Kind bar or cookie (tries to limit)    Dinner meat + veg +/or fruit + starch Mac and cheese/ potato/ sweet potato/ pasta    Snack (evening) none    Beverage(s) decaf unsweetened tea; Perrier with lime; occ soda or orange juice      Activity / Exercise   Activity / Exercise Type ADL's   cares for husband with alzheimer's dementia  Patient Education   Previous Diabetes Education Yes (please comment)   2018   Healthy Eating Carbohydrate counting;Food label reading, portion sizes and measuring food.;Role of diet in the treatment of diabetes and the relationship between the three main macronutrients and blood glucose level    Medications Other (comment)   managing basal and bolus delivery; factors that affect BGs   Monitoring Taught/discussed recording of test results and interpretation of SMBG.    Acute complications Taught prevention, symptoms, and  treatment of hypoglycemia - the 15 rule.    Diabetes Stress and Support Role of stress on diabetes;Identified and addressed patients feelings and concerns about  diabetes      Outcomes   Expected Outcomes Demonstrated interest in learning. Expect positive outcomes    Future DMSE 4-6 wks   by mail/ phone            Individualized Plan for Diabetes Self-Management Training:   Learning Objective:  Patient will have a greater understanding of diabetes self-management. Patient education plan is to attend individual and/or group sessions per assessed needs and concerns.  Other intervention notes: Patient reports symptoms of depression/ feeling defeated/ burned out due to her health issues and caring for husband with late stage Alzheimer's disease. She does have support from daughter. Housekeeper helps with cleaning. She is taking Lexapro but still having depression symptoms. Also reports chronic insomnia. Visit focused on carb counting for managing insulin boluses. Encouraged consistent carb intake with meals to minimize need for adjusting insulin rates.  She plans to contact rep/ training at [facility] for help in pump settings and adjustments.    Plan:   Patient Instructions  Give bolus doses about 15 minutes before meals.  Measure portions of carb foods with measuring cups or using hand to estimate. Aim for 30-45 grams of carb with meals.  Use food list resources, food labels, or online search Keep a diary of what and how much is eaten along with the carb grams, and send 4-7 days worth to [facility] for evaluation. Contact omnipod rep/ trainer for help with adjusting basal rates, bolus rates, and/or extended bolus if needed.  Expected Outcomes:  Demonstrated interest in learning. Expect positive outcomes  Education material provided: Plate planner with food lists; combination foods list; Omnipod carb counting, basal and bolus management handouts  If problems or questions, patient to contact team via:  Phone and patient portal  Future DSME appointment: 4-6 wks (by mail/ phone) to evaluate food and carb counting journal

## 2023-11-24 ENCOUNTER — Encounter: Payer: Self-pay | Admitting: Oncology

## 2023-11-26 ENCOUNTER — Other Ambulatory Visit: Payer: Self-pay

## 2023-11-26 DIAGNOSIS — D631 Anemia in chronic kidney disease: Secondary | ICD-10-CM

## 2023-11-27 ENCOUNTER — Inpatient Hospital Stay: Payer: Medicare Other | Attending: Oncology

## 2023-11-27 ENCOUNTER — Inpatient Hospital Stay: Payer: Medicare Other

## 2023-11-27 DIAGNOSIS — D631 Anemia in chronic kidney disease: Secondary | ICD-10-CM

## 2023-11-27 DIAGNOSIS — I130 Hypertensive heart and chronic kidney disease with heart failure and stage 1 through stage 4 chronic kidney disease, or unspecified chronic kidney disease: Secondary | ICD-10-CM | POA: Insufficient documentation

## 2023-11-27 DIAGNOSIS — N1832 Chronic kidney disease, stage 3b: Secondary | ICD-10-CM | POA: Diagnosis present

## 2023-11-27 DIAGNOSIS — D509 Iron deficiency anemia, unspecified: Secondary | ICD-10-CM | POA: Insufficient documentation

## 2023-11-27 LAB — IRON AND TIBC
Iron: 40 ug/dL (ref 28–170)
Saturation Ratios: 14 % (ref 10.4–31.8)
TIBC: 288 ug/dL (ref 250–450)
UIBC: 248 ug/dL

## 2023-11-27 LAB — CBC (CANCER CENTER ONLY)
HCT: 31.5 % — ABNORMAL LOW (ref 36.0–46.0)
Hemoglobin: 10.1 g/dL — ABNORMAL LOW (ref 12.0–15.0)
MCH: 29.4 pg (ref 26.0–34.0)
MCHC: 32.1 g/dL (ref 30.0–36.0)
MCV: 91.6 fL (ref 80.0–100.0)
Platelet Count: 217 10*3/uL (ref 150–400)
RBC: 3.44 MIL/uL — ABNORMAL LOW (ref 3.87–5.11)
RDW: 16.4 % — ABNORMAL HIGH (ref 11.5–15.5)
WBC Count: 7.6 10*3/uL (ref 4.0–10.5)
nRBC: 0 % (ref 0.0–0.2)

## 2023-11-27 LAB — FERRITIN: Ferritin: 269 ng/mL (ref 11–307)

## 2024-02-01 ENCOUNTER — Other Ambulatory Visit: Payer: Self-pay

## 2024-02-01 DIAGNOSIS — E611 Iron deficiency: Secondary | ICD-10-CM

## 2024-02-01 DIAGNOSIS — N183 Chronic kidney disease, stage 3 unspecified: Secondary | ICD-10-CM

## 2024-02-02 ENCOUNTER — Inpatient Hospital Stay (HOSPITAL_BASED_OUTPATIENT_CLINIC_OR_DEPARTMENT_OTHER): Payer: BLUE CROSS/BLUE SHIELD | Admitting: Oncology

## 2024-02-02 ENCOUNTER — Encounter: Payer: Self-pay | Admitting: Oncology

## 2024-02-02 ENCOUNTER — Inpatient Hospital Stay: Payer: BLUE CROSS/BLUE SHIELD | Attending: Oncology

## 2024-02-02 VITALS — BP 145/57 | HR 65 | Temp 97.2°F | Resp 18 | Ht 60.0 in | Wt 173.0 lb

## 2024-02-02 DIAGNOSIS — I5032 Chronic diastolic (congestive) heart failure: Secondary | ICD-10-CM | POA: Diagnosis not present

## 2024-02-02 DIAGNOSIS — N183 Chronic kidney disease, stage 3 unspecified: Secondary | ICD-10-CM | POA: Diagnosis present

## 2024-02-02 DIAGNOSIS — D509 Iron deficiency anemia, unspecified: Secondary | ICD-10-CM

## 2024-02-02 DIAGNOSIS — D631 Anemia in chronic kidney disease: Secondary | ICD-10-CM | POA: Insufficient documentation

## 2024-02-02 DIAGNOSIS — E611 Iron deficiency: Secondary | ICD-10-CM

## 2024-02-02 DIAGNOSIS — I130 Hypertensive heart and chronic kidney disease with heart failure and stage 1 through stage 4 chronic kidney disease, or unspecified chronic kidney disease: Secondary | ICD-10-CM | POA: Diagnosis present

## 2024-02-02 LAB — CBC (CANCER CENTER ONLY)
HCT: 32.7 % — ABNORMAL LOW (ref 36.0–46.0)
Hemoglobin: 10.3 g/dL — ABNORMAL LOW (ref 12.0–15.0)
MCH: 29.3 pg (ref 26.0–34.0)
MCHC: 31.5 g/dL (ref 30.0–36.0)
MCV: 93.2 fL (ref 80.0–100.0)
Platelet Count: 268 10*3/uL (ref 150–400)
RBC: 3.51 MIL/uL — ABNORMAL LOW (ref 3.87–5.11)
RDW: 14.3 % (ref 11.5–15.5)
WBC Count: 7.9 10*3/uL (ref 4.0–10.5)
nRBC: 0 % (ref 0.0–0.2)

## 2024-02-02 LAB — IRON AND TIBC
Iron: 45 ug/dL (ref 28–170)
Saturation Ratios: 15 % (ref 10.4–31.8)
TIBC: 307 ug/dL (ref 250–450)
UIBC: 262 ug/dL

## 2024-02-02 LAB — FERRITIN: Ferritin: 278 ng/mL (ref 11–307)

## 2024-02-02 NOTE — Progress Notes (Signed)
 Hematology/Oncology Consult note [facility]  [phone] [phone]  Patient Care Team: Darrick Penna, MD as PCP - General (Internal Medicine) Creig Hines, MD as Consulting Physician (Oncology)   Name of the patient: Lindsay Glenn  [id number]  26-Jul-1946   Date of visit: 02/02/24  Diagnosis-anemia of chronic kidney disease with a component of iron deficiency  Chief complaint/ Reason for visit-routine follow-up for anemia of chronic kidney disease  Heme/Onc history: patient is a 78 year old female with a past medical history significant for stage III kidney disease hypertension hyperlipidemia obstructive sleep apnea among other medical Problems.  She has been referred for anemia.  CBC from 09/08/2023 showed white count of 8.1, H&H of 9.7/20.9 with an MCV of 89 and a platelet count of 265.  Ferritin levels were normal at 118.  Looking back at her prior CBCs her hemoglobin has been between 11-12 up until January 2024 and subsequently drifted down to the 10's. Patient received IV iron in November 2024  Interval history-presently she feels better after receiving IV iron.  She did undergo bypass surgery about 2 weeks ago and may require aortic valve repair as well.  ECOG PS- 1 Pain scale- 0  Review of systems- Review of Systems  Constitutional:  Negative for chills, fever, malaise/fatigue and weight loss.  HENT:  Negative for congestion, ear discharge and nosebleeds.   Eyes:  Negative for blurred vision.  Respiratory:  Negative for cough, hemoptysis, sputum production, shortness of breath and wheezing.   Cardiovascular:  Negative for chest pain, palpitations, orthopnea and claudication.  Gastrointestinal:  Negative for abdominal pain, blood in stool, constipation, diarrhea, heartburn, melena, nausea and vomiting.  Genitourinary:  Negative for dysuria, flank pain, frequency, hematuria and urgency.  Musculoskeletal:  Negative for back  pain, joint pain and myalgias.  Skin:  Negative for rash.  Neurological:  Negative for dizziness, tingling, focal weakness, seizures, weakness and headaches.  Endo/Heme/Allergies:  Does not bruise/bleed easily.  Psychiatric/Behavioral:  Negative for depression and suicidal ideas. The patient does not have insomnia.       Allergies  Allergen Reactions   Empagliflozin Nausea And Vomiting and Other (See Comments)    Decreased energy (Jardiance)   Tape Other (See Comments)    Causes rash, blistering( if on for several days)   Paper tape and tegaderm  is OK   Atorvastatin Other (See Comments)    Increases blood sugar   Furosemide Itching and Rash   Lisinopril Cough   Rosuvastatin Other (See Comments)    Increases blood sugar   Trulicity [Dulaglutide] Diarrhea and Nausea And Vomiting     Past Medical History:  Diagnosis Date   (HFpEF) heart failure with preserved ejection fraction (HCC)    a.) TTE 02/07/2020: EF 55%. b.) TTE 08/26/2021: EF >55%; mild BAE   Aortic stenosis    a.) TTE 02/07/2020: EF 55%; trivial MR; mild AS with MPG 8 mmHg. b.) TTE 08/26/2021: EF >55%; mild MR/TR/PR; mild BAE; mild AS with MPG 21 mmHg.   Arthritis    BILATERAL rotator cuff tendinitis    CHF (congestive heart failure) (HCC)    CKD (chronic kidney disease), stage III (HCC)    Complication of anesthesia    a.) (+) aspiration even during colonoscopy   CTS (carpal tunnel syndrome)    Depression    DOE (dyspnea on exertion)    Dyspnea    Gastroparesis    GERD (gastroesophageal reflux disease)    Hashimoto's thyroiditis  Heart murmur    History of hiatal hernia    HLD (hyperlipidemia)    Hypertension    IDA (iron deficiency anemia)    Insomnia    LADA (latent autoimmune diabetes in adults), managed as type 1 (HCC)    a.) uses insulin pump   Obesity    Osteopenia    Osteoporosis    Pancreatic cyst    Pneumonia    PONV (postoperative nausea and vomiting)    Pseudophakia of both eyes     Renal cyst, right    Restless leg syndrome    Sleep apnea    a.) unable to tolerate nocturnal PAP therapy   Stroke (HCC) 2017   field of vision lower on right eye   TIA (transient ischemic attack) 2017     Past Surgical History:  Procedure Laterality Date   ABDOMINAL HYSTERECTOMY  1994   ANKLE FRACTURE SURGERY Right 2004   screws;plate   BRACHIOPLASTY Bilateral 2018   fat removal from both upper arms   BREAST SURGERY  2000   bilateral mastectomy   CARPAL TUNNEL RELEASE Right 2004   CARPAL TUNNEL RELEASE Right 04/04/2020   Procedure: OPEN CARPAL TUNNEL RELEASE;  Surgeon: Christena Flake, MD;  Location: [facility];  Service: Orthopedics;  Laterality: Right;   COLONOSCOPY     FRACTURE SURGERY     JOINT REPLACEMENT Bilateral    knee   MASTECTOMY     SHOULDER ARTHROSCOPY Right 2016   TOTAL SHOULDER ARTHROPLASTY Right 09/05/2021   Procedure: TOTAL SHOULDER ARTHROPLASTY;  Surgeon: Christena Flake, MD;  Location: [facility] OR[facility];  Service: Orthopedics;  Laterality: Right;   UPPER ESOPHAGEAL ENDOSCOPIC ULTRASOUND (EUS)  2020   pancreas    Social History   Socioeconomic History   Marital status: Married    Spouse name: [name]   Number of children: Not on file   Years of education: Not on file   Highest education level: Not on file  Occupational History   Not on file  Tobacco Use   Smoking status: Never   Smokeless tobacco: Never  Vaping Use   Vaping status: Never Used  Substance and Sexual Activity   Alcohol use: Yes    Comment: rarely   Drug use: Never   Sexual activity: Not on file  Other Topics Concern   Not on file  Social History Narrative   Not on file   Social Drivers of Health   Financial Resource Strain: Low Risk  (01/11/2024)   Received from [facility]   Overall Financial Resource Strain (CARDIA)    Difficulty of Paying Living Expenses: Not hard at all  Food Insecurity: No Food Insecurity (01/11/2024)   Received from [facility]    Hunger Vital Sign    Worried About Running Out of Food in the Last Year: Never true    Ran Out of Food in the Last Year: Never true  Transportation Needs: No Transportation Needs (01/11/2024)   Received from [facility] - Transportation    In the past 12 months, has lack of transportation kept you from medical appointments or from getting medications?: No    Lack of Transportation (Non-Medical): No  Physical Activity: Insufficiently Active (07/17/2021)   Received from [facility], [facility]   Exercise Vital Sign    Days of Exercise per Week: 1 day    Minutes of Exercise per Session: 60 min  Stress: Stress Concern  Present (01/18/2021)   Received from [facility], [facility]   [employer] of Occupational Health - Occupational Stress Questionnaire    Feeling of Stress : Rather much  Social Connections: Socially Integrated (07/10/2020)   Received from [facility], [facility]   Social Connection and Isolation Panel [NHANES]    Frequency of Communication with Friends and Family: Three times a week    Frequency of Social Gatherings with Friends and Family: Three times a week    Attends Religious Services: More than 4 times per year    Active Member of Clubs or Organizations: Yes    Attends Banker Meetings: More than 4 times per year    Marital Status: Married  Catering manager Violence: Not At Risk (09/14/2023)   Humiliation, Afraid, Rape, and Kick questionnaire    Fear of Current or Ex-Partner: No    Emotionally Abused: No    Physically Abused: No    Sexually Abused: No    Family History  Problem Relation Age of Onset   Alzheimer's disease Mother    Cancer Father      Current Outpatient Medications:    amLODipine (NORVASC) 5 MG tablet, TAKE ONE (1) TABLET (5 MG TOTAL) BY MOUTH ONCE DAILY, Disp: , Rfl:    clopidogrel (PLAVIX) 75  MG tablet, Take by mouth., Disp: , Rfl:    FLUoxetine (PROZAC) 10 MG capsule, Take by mouth., Disp: , Rfl:    ALPRAZolam (XANAX) 0.5 MG tablet, Take 0.5 mg by mouth daily., Disp: , Rfl:    aspirin EC 81 MG tablet, Take by mouth., Disp: , Rfl:    azelastine (OPTIVAR) 0.05 % ophthalmic solution, Apply to eye., Disp: , Rfl:    b complex vitamins tablet, Take 1 tablet by mouth daily., Disp: , Rfl:    Biotin 1 MG CAPS, Take 1 capsule by mouth daily., Disp: , Rfl:    bumetanide (BUMEX) 1 MG tablet, Take 1 mg by mouth daily., Disp: , Rfl:    bumetanide (BUMEX) 2 MG tablet, Take by mouth daily., Disp: , Rfl:    cephALEXin (KEFLEX) 500 MG capsule, Take 500 mg by mouth as needed. For dental appointments (Patient not taking: Reported on 09/14/2023), Disp: , Rfl:    Cholecalciferol (VITAMIN D) 50 MCG (2000 UT) tablet, Take 2,000 Units by mouth daily., Disp: , Rfl:    Coenzyme Q10 (CO Q 10 PO), Take 1 tablet by mouth daily., Disp: , Rfl:    escitalopram (LEXAPRO) 10 MG tablet, Take 10 mg by mouth daily. (Patient not taking: Reported on 02/02/2024), Disp: , Rfl:    ezetimibe (ZETIA) 10 MG tablet, Take 10 mg by mouth daily., Disp: , Rfl:    GVOKE HYPOPEN 2-PACK 1 MG/0.2ML SOAJ, Inject into the skin., Disp: , Rfl:    hydrALAZINE (APRESOLINE) 100 MG tablet, Take 100 mg by mouth 2 (two) times daily., Disp: , Rfl:    hydrOXYzine (ATARAX) 10 MG tablet, Take by mouth., Disp: , Rfl:    insulin aspart (NOVOLOG) 100 UNIT/ML injection, Inject 70-80 Units into the skin daily. Via OmniPod Insulin pump, Disp: , Rfl:    Insulin Disposable Pump (OMNIPOD DASH PODS, GEN 4,) MISC, Inject into the skin., Disp: , Rfl:    insulin glargine (LANTUS SOLOSTAR) 100 UNIT/ML Solostar Pen, In the event of insulin pump failure, inject Lantus 41 units daily and restart the insulin pump 24 hours after Lantus is given., Disp: , Rfl:  Insulin Pen Needle (BD PEN NEEDLE NANO 2ND GEN) 32G X 4 MM MISC, Inject insulin as directed if the insulin  pump were to fail., Disp: , Rfl:    lamoTRIgine (LAMICTAL) 200 MG tablet, Take 400 mg by mouth daily. At bedtime, Disp: , Rfl:    losartan (COZAAR) 100 MG tablet, Take 100 mg by mouth at bedtime., Disp: , Rfl:    Multiple Vitamins-Minerals (MULTIVITAMIN WITH MINERALS) tablet, Take 1 tablet by mouth daily., Disp: , Rfl:    omeprazole (PRILOSEC) 20 MG capsule, Take 20 mg by mouth as needed. (Patient not taking: Reported on 09/14/2023), Disp: , Rfl:    pantoprazole (PROTONIX) 40 MG tablet, Take by mouth., Disp: , Rfl:    pravastatin (PRAVACHOL) 80 MG tablet, Take 80 mg by mouth at bedtime., Disp: , Rfl:    predniSONE (DELTASONE) 50 MG tablet, Take 1 tablet (50 mg total) by mouth daily with breakfast. Risk of hyperglycemia, monitor blood sugar closely, Disp: 5 tablet, Rfl: 0   rOPINIRole (REQUIP) 2 MG tablet, Take 2 mg by mouth at bedtime., Disp: , Rfl:    rOPINIRole (REQUIP) 4 MG tablet, Take 4 mg by mouth at bedtime., Disp: , Rfl:   Physical exam:  Vitals:   02/02/24 1355  BP: (!) 145/57  Pulse: 65  Resp: 18  Temp: (!) 97.2 F (36.2 C)  TempSrc: Tympanic  SpO2: 100%  Weight: 173 lb (78.5 kg)  Height: 5' (1.524 m)   Physical Exam Cardiovascular:     Rate and Rhythm: Normal rate and regular rhythm.     Heart sounds: Normal heart sounds.  Pulmonary:     Effort: Pulmonary effort is normal.     Breath sounds: Normal breath sounds.  Skin:    General: Skin is warm and dry.  Neurological:     Mental Status: She is alert and oriented to person, place, and time.         Latest Ref Rng & Units 09/14/2023    3:38 PM  CMP  Glucose 70 - 99 mg/dL 161   BUN 8 - 23 mg/dL 44   Creatinine 0.96 - 1.00 mg/dL 0.45   Sodium 409 - 811 mmol/L 136   Potassium 3.5 - 5.1 mmol/L 4.1   Chloride 98 - 111 mmol/L 104   CO2 22 - 32 mmol/L 23   Calcium 8.9 - 10.3 mg/dL 9.0   Total Protein 6.5 - 8.1 g/dL 7.4   Total Bilirubin 0.3 - 1.2 mg/dL 0.4   Alkaline Phos 38 - 126 U/L 108   AST 15 - 41 U/L 20    ALT 0 - 44 U/L 23       Latest Ref Rng & Units 02/02/2024    1:38 PM  CBC  WBC 4.0 - 10.5 K/uL 7.9   Hemoglobin 12.0 - 15.0 g/dL 91.4   Hematocrit 78.2 - 46.0 % 32.7   Platelets 150 - 400 K/uL 268      Assessment and plan- Patient is a 78 y.o. female here for routine follow-up of anemia of chronic kidney disease  Patient's hemoglobin has improved from 8.9 in October 2024 presently to 10.3.  Iron studies show ferritin levels of more 100 and she does not require any IV iron at this time.  Also given that her hemoglobin remains more than 9 with IV iron I do not see a reason to initiate EPO at this time.  CBC ferritin and iron studies in 3 and 6 months and I will see  her back in 6 months.  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  I will follow-up on those labs in 6 months as well.   Visit Diagnosis 1. Anemia of chronic kidney failure, stage 3 (moderate) (HCC)   2. Iron deficiency anemia, unspecified iron deficiency anemia type      Dr. Owens Shark, MD, MPH [facility] [phone] 02/02/2024 4:14 PM

## 2024-03-17 ENCOUNTER — Other Ambulatory Visit: Payer: Self-pay | Admitting: Infectious Diseases

## 2024-03-17 DIAGNOSIS — I5032 Chronic diastolic (congestive) heart failure: Secondary | ICD-10-CM

## 2024-03-17 DIAGNOSIS — R0609 Other forms of dyspnea: Secondary | ICD-10-CM

## 2024-03-28 ENCOUNTER — Ambulatory Visit

## 2024-03-30 ENCOUNTER — Other Ambulatory Visit: Payer: Self-pay | Admitting: Emergency Medicine

## 2024-03-30 ENCOUNTER — Other Ambulatory Visit: Payer: Self-pay | Admitting: Pulmonary Disease

## 2024-03-30 DIAGNOSIS — J90 Pleural effusion, not elsewhere classified: Secondary | ICD-10-CM

## 2024-04-04 ENCOUNTER — Ambulatory Visit
Admission: RE | Admit: 2024-04-04 | Discharge: 2024-04-04 | Disposition: A | Discharge status: Home / Self Care | Source: Ambulatory Visit | Attending: Urology | Admitting: Urology

## 2024-04-04 ENCOUNTER — Ambulatory Visit
Admission: RE | Admit: 2024-04-04 | Discharge: 2024-04-04 | Disposition: A | Discharge status: Home / Self Care | Source: Ambulatory Visit | Attending: Pulmonary Disease | Admitting: Pulmonary Disease

## 2024-04-04 ENCOUNTER — Other Ambulatory Visit: Payer: Self-pay | Admitting: Urology

## 2024-04-04 DIAGNOSIS — J90 Pleural effusion, not elsewhere classified: Secondary | ICD-10-CM

## 2024-04-04 DIAGNOSIS — E785 Hyperlipidemia, unspecified: Secondary | ICD-10-CM | POA: Insufficient documentation

## 2024-04-04 LAB — AMYLASE, PLEURAL OR PERITONEAL FLUID: Amylase, Fluid: 28 U/L

## 2024-04-04 LAB — BODY FLUID CELL COUNT WITH DIFFERENTIAL
Eos, Fluid: 15 %
Lymphs, Fluid: 22 %
Monocyte-Macrophage-Serous Fluid: 45 %
Neutrophil Count, Fluid: 17 %
Other Cells, Fluid: 1 %
Total Nucleated Cell Count, Fluid: 858 uL

## 2024-04-04 LAB — PROTEIN, PLEURAL OR PERITONEAL FLUID: Total protein, fluid: 4.8 g/dL

## 2024-04-04 LAB — GLUCOSE, PLEURAL OR PERITONEAL FLUID: Glucose, Fluid: 222 mg/dL

## 2024-04-04 LAB — LACTATE DEHYDROGENASE, PLEURAL OR PERITONEAL FLUID: LD, Fluid: 129 U/L — ABNORMAL HIGH (ref 3–23)

## 2024-04-04 MED ORDER — LIDOCAINE HCL (PF) 1 % IJ SOLN
10.0000 mL | Freq: Once | INTRAMUSCULAR | Status: AC
  Administered 2024-04-04: 10 mL via INTRADERMAL
  Filled 2024-04-04: qty 10

## 2024-04-04 NOTE — Procedures (Signed)
 PROCEDURE SUMMARY:  Successful image-guided right-sided diagnostic and therapeutic thoracentesis. Yielded .420 liters of clear, blood-tinged pleural fluid. Patient tolerated procedure well. EBL: Zero No immediate complications.  Specimen was sent for labs. Post procedure CXR shows no pneumothorax.  Please see imaging section of Epic for full dictation.  Gordy Lauber Sharde Gover PA-C 04/04/2024 9:42 AM

## 2024-04-05 LAB — CYTOLOGY - NON PAP

## 2024-04-07 LAB — CHOLESTEROL, BODY FLUID: Cholesterol, Fluid: 97 mg/dL

## 2024-04-08 LAB — BODY FLUID CULTURE W GRAM STAIN
Culture: NO GROWTH
Gram Stain: NONE SEEN

## 2024-04-08 LAB — ACID FAST SMEAR (AFB, MYCOBACTERIA): Acid Fast Smear: NEGATIVE

## 2024-04-13 LAB — MISC LABCORP TEST (SEND OUT): Labcorp test code: 9985

## 2024-05-01 ENCOUNTER — Encounter: Payer: Self-pay | Admitting: Oncology

## 2024-05-03 ENCOUNTER — Inpatient Hospital Stay

## 2024-05-04 ENCOUNTER — Other Ambulatory Visit

## 2024-05-10 LAB — FUNGAL ORGANISM REFLEX

## 2024-05-10 LAB — FUNGUS CULTURE WITH STAIN

## 2024-05-20 LAB — ACID FAST CULTURE WITH REFLEXED SENSITIVITIES (MYCOBACTERIA): Acid Fast Culture: NEGATIVE

## 2024-06-29 ENCOUNTER — Other Ambulatory Visit: Payer: Self-pay | Admitting: Rheumatology

## 2024-06-29 DIAGNOSIS — R768 Other specified abnormal immunological findings in serum: Secondary | ICD-10-CM

## 2024-06-29 DIAGNOSIS — J90 Pleural effusion, not elsewhere classified: Secondary | ICD-10-CM

## 2024-07-04 ENCOUNTER — Other Ambulatory Visit

## 2024-07-07 ENCOUNTER — Ambulatory Visit
Admission: RE | Admit: 2024-07-07 | Discharge: 2024-07-07 | Disposition: A | Discharge status: Home / Self Care | Source: Ambulatory Visit | Attending: Rheumatology | Admitting: Rheumatology

## 2024-07-07 DIAGNOSIS — J90 Pleural effusion, not elsewhere classified: Secondary | ICD-10-CM

## 2024-07-07 DIAGNOSIS — R768 Other specified abnormal immunological findings in serum: Secondary | ICD-10-CM

## 2024-07-11 ENCOUNTER — Other Ambulatory Visit: Payer: Self-pay | Admitting: Rheumatology

## 2024-07-11 DIAGNOSIS — R1900 Intra-abdominal and pelvic swelling, mass and lump, unspecified site: Secondary | ICD-10-CM

## 2024-07-14 ENCOUNTER — Inpatient Hospital Stay: Admission: RE | Admit: 2024-07-14 | Source: Ambulatory Visit

## 2024-07-20 ENCOUNTER — Other Ambulatory Visit: Payer: Self-pay | Admitting: Infectious Diseases

## 2024-07-20 DIAGNOSIS — K8689 Other specified diseases of pancreas: Secondary | ICD-10-CM

## 2024-08-04 ENCOUNTER — Ambulatory Visit
Admission: RE | Admit: 2024-08-04 | Discharge: 2024-08-04 | Disposition: A | Discharge status: Home / Self Care | Source: Ambulatory Visit | Attending: Infectious Diseases | Admitting: Infectious Diseases

## 2024-08-04 DIAGNOSIS — K8689 Other specified diseases of pancreas: Secondary | ICD-10-CM | POA: Diagnosis present

## 2024-08-04 MED ORDER — IOHEXOL 300 MG/ML  SOLN
100.0000 mL | Freq: Once | INTRAMUSCULAR | Status: AC | PRN
  Administered 2024-08-04: 80 mL via INTRAVENOUS

## 2024-08-09 ENCOUNTER — Inpatient Hospital Stay (HOSPITAL_BASED_OUTPATIENT_CLINIC_OR_DEPARTMENT_OTHER): Admitting: Oncology

## 2024-08-09 ENCOUNTER — Encounter: Payer: Self-pay | Admitting: Oncology

## 2024-08-09 ENCOUNTER — Other Ambulatory Visit

## 2024-08-09 ENCOUNTER — Inpatient Hospital Stay: Attending: Oncology

## 2024-08-09 ENCOUNTER — Ambulatory Visit: Admitting: Oncology

## 2024-08-09 VITALS — BP 127/57 | HR 61 | Temp 96.7°F | Ht 60.0 in | Wt 178.1 lb

## 2024-08-09 DIAGNOSIS — I129 Hypertensive chronic kidney disease with stage 1 through stage 4 chronic kidney disease, or unspecified chronic kidney disease: Secondary | ICD-10-CM | POA: Diagnosis present

## 2024-08-09 DIAGNOSIS — D631 Anemia in chronic kidney disease: Secondary | ICD-10-CM | POA: Diagnosis not present

## 2024-08-09 DIAGNOSIS — N183 Chronic kidney disease, stage 3 unspecified: Secondary | ICD-10-CM | POA: Insufficient documentation

## 2024-08-09 DIAGNOSIS — D472 Monoclonal gammopathy: Secondary | ICD-10-CM | POA: Diagnosis not present

## 2024-08-09 LAB — CBC WITH DIFFERENTIAL (CANCER CENTER ONLY)
Abs Immature Granulocytes: 0.05 K/uL (ref 0.00–0.07)
Basophils Absolute: 0.1 K/uL (ref 0.0–0.1)
Basophils Relative: 1 %
Eosinophils Absolute: 0.2 K/uL (ref 0.0–0.5)
Eosinophils Relative: 3 %
HCT: 31.3 % — ABNORMAL LOW (ref 36.0–46.0)
Hemoglobin: 10.3 g/dL — ABNORMAL LOW (ref 12.0–15.0)
Immature Granulocytes: 1 %
Lymphocytes Relative: 9 %
Lymphs Abs: 0.7 K/uL (ref 0.7–4.0)
MCH: 31.3 pg (ref 26.0–34.0)
MCHC: 32.9 g/dL (ref 30.0–36.0)
MCV: 95.1 fL (ref 80.0–100.0)
Monocytes Absolute: 0.6 K/uL (ref 0.1–1.0)
Monocytes Relative: 7 %
Neutro Abs: 6.1 K/uL (ref 1.7–7.7)
Neutrophils Relative %: 79 %
Platelet Count: 191 K/uL (ref 150–400)
RBC: 3.29 MIL/uL — ABNORMAL LOW (ref 3.87–5.11)
RDW: 15.8 % — ABNORMAL HIGH (ref 11.5–15.5)
WBC Count: 7.7 K/uL (ref 4.0–10.5)
nRBC: 0 % (ref 0.0–0.2)

## 2024-08-09 LAB — IRON AND TIBC
Iron: 65 ug/dL (ref 28–170)
Saturation Ratios: 24 % (ref 10.4–31.8)
TIBC: 270 ug/dL (ref 250–450)
UIBC: 205 ug/dL

## 2024-08-09 LAB — FERRITIN: Ferritin: 501 ng/mL — ABNORMAL HIGH (ref 11–307)

## 2024-08-09 NOTE — Progress Notes (Signed)
 Patient states she's been through quite a bit since her last visit. She also is currently battling shingles.

## 2024-08-09 NOTE — Progress Notes (Signed)
 Hematology/Oncology Consult note [facility]  [phone] [phone]  Patient Care Team: Lindsay Alm SQUIBB, MD as PCP - General (Infectious Diseases) Lindsay Annah BROCKS, MD as Consulting Physician (Oncology)   Name of the patient: Lindsay Glenn  [id number]  [birth year]   Date of visit: 08/09/24  Diagnosis-anemia of chronic disease with a component of iron deficiency  Chief complaint/ Reason for visit- routine f/u of anemia  Heme/Onc history:  patient is a 78 year old female with a past medical history significant for stage III kidney disease hypertension hyperlipidemia obstructive sleep apnea among other medical Problems.  She has been referred for anemia.  CBC from 09/08/2023 showed white count of 8.1, H&H of 9.7/20.9 with an MCV of 89 and a platelet count of 265.  Ferritin levels were normal at 118.  Looking back at her prior CBCs her hemoglobin has been between 11-12 up until January 2024 and subsequently drifted down to the 10's. Patient received IV iron in November 2024  Interval history-patient has had a rough year 2025.  She had stroke followed by pneumonia as well as coronary artery disease requiring bypass and TAVR procedure she was also diagnosed with arthritis and was started on methotrexate by rheumatology which was complicated by shingles involving her right upper extremity shortly thereafter.  Her husband passed away a couple of months ago  ECOG PS- 2 Pain scale- 4   Review of systems- Review of Systems  Constitutional:  Negative for chills, fever, malaise/fatigue and weight loss.  HENT:  Negative for congestion, ear discharge and nosebleeds.   Eyes:  Negative for blurred vision.  Respiratory:  Negative for cough, hemoptysis, sputum production, shortness of breath and wheezing.   Cardiovascular:  Negative for chest pain, palpitations, orthopnea and claudication.  Gastrointestinal:  Negative for abdominal pain, blood in stool,  constipation, diarrhea, heartburn, melena, nausea and vomiting.  Genitourinary:  Negative for dysuria, flank pain, frequency, hematuria and urgency.  Musculoskeletal:  Negative for back pain, joint pain and myalgias.  Skin:  Negative for rash.  Neurological:  Negative for dizziness, tingling, focal weakness, seizures, weakness and headaches.  Endo/Heme/Allergies:  Does not bruise/bleed easily.  Psychiatric/Behavioral:  Negative for depression and suicidal ideas. The patient does not have insomnia.       Allergies  Allergen Reactions   Empagliflozin Nausea And Vomiting and Other (See Comments)    Decreased energy (Jardiance)   Silicone Other (See Comments)    Causes rash, blistering( if on for several days)   Paper tape and tegaderm  is OK briefly   Tape Other (See Comments)    Causes rash, blistering( if on for several days)   Paper tape and tegaderm  is OK   Gabapentin Other (See Comments)   Atorvastatin Other (See Comments)    Increases blood sugar   Furosemide Itching and Rash   Lisinopril Cough   Rosuvastatin  Other (See Comments)    Increases blood sugar   Trulicity [Dulaglutide] Diarrhea and Nausea And Vomiting     Past Medical History:  Diagnosis Date   (HFpEF) heart failure with preserved ejection fraction (HCC)    a.) TTE 02/07/2020: EF 55%. b.) TTE 08/26/2021: EF >55%; mild BAE   Aortic stenosis    a.) TTE 02/07/2020: EF 55%; trivial MR; mild AS with MPG 8 mmHg. b.) TTE 08/26/2021: EF >55%; mild MR/TR/PR; mild BAE; mild AS with MPG 21 mmHg.   Arthritis    BILATERAL rotator cuff tendinitis    CHF (congestive  heart failure) (HCC)    CKD (chronic kidney disease), stage III (HCC)    Complication of anesthesia    a.) (+) aspiration even during colonoscopy   CTS (carpal tunnel syndrome)    Depression    DOE (dyspnea on exertion)    Dyspnea    Gastroparesis    GERD (gastroesophageal reflux disease)    Hashimoto's thyroiditis    Heart murmur    History of hiatal  hernia    HLD (hyperlipidemia)    Hypertension    IDA (iron deficiency anemia)    Insomnia    LADA (latent autoimmune diabetes in adults), managed as type 1 (HCC)    a.) uses insulin  pump   Obesity    Osteopenia    Osteoporosis    Pancreatic cyst    Pneumonia    PONV (postoperative nausea and vomiting)    Pseudophakia of both eyes    Renal cyst, right    Restless leg syndrome    Sleep apnea    a.) unable to tolerate nocturnal PAP therapy   Stroke (HCC) 2017   field of vision lower on right eye   TIA (transient ischemic attack) 2017     Past Surgical History:  Procedure Laterality Date   ABDOMINAL HYSTERECTOMY  1994   ANKLE FRACTURE SURGERY Right 2004   screws;plate   BRACHIOPLASTY Bilateral 2018   fat removal from both upper arms   BREAST SURGERY  2000   bilateral mastectomy   CARPAL TUNNEL RELEASE Right 2004   CARPAL TUNNEL RELEASE Right 04/04/2020   Procedure: OPEN CARPAL TUNNEL RELEASE;  Surgeon: Lindsay Norleen PARAS, MD;  Location: [facility] ORS;  Service: Orthopedics;  Laterality: Right;   COLONOSCOPY     FRACTURE SURGERY     JOINT REPLACEMENT Bilateral    knee   MASTECTOMY     SHOULDER ARTHROSCOPY Right 2016   TOTAL SHOULDER ARTHROPLASTY Right 09/05/2021   Procedure: TOTAL SHOULDER ARTHROPLASTY;  Surgeon: Lindsay Norleen PARAS, MD;  Location: [facility] ORS;  Service: Orthopedics;  Laterality: Right;   UPPER ESOPHAGEAL ENDOSCOPIC ULTRASOUND (EUS)  2020   pancreas    Social History   Socioeconomic History   Marital status: Married    Spouse name: Lindsay Glenn   Number of children: Not on file   Years of education: Not on file   Highest education level: Not on file  Occupational History   Not on file  Tobacco Use   Smoking status: Never   Smokeless tobacco: Never  Vaping Use   Vaping status: Never Used  Substance and Sexual Activity   Alcohol use: Yes    Comment: rarely   Drug use: Never   Sexual activity: Not on file  Other Topics Concern   Not on file  Social History  Narrative   Not on file   Social Drivers of Health   Financial Resource Strain: Low Risk  (07/29/2024)   Received from [facility]   Overall Financial Resource Strain (CARDIA)    Difficulty of Paying Living Expenses: Not hard at all  Food Insecurity: No Food Insecurity (07/29/2024)   Received from [facility]   Hunger Vital Sign    Within the past 12 months, you worried that your food would run out before you got the money to buy more.: Never true    Within the past 12 months, the food you bought just didn't last and you didn't have money to get more.: Never true  Transportation Needs: No Transportation Needs (  06/14/2024)   Received from [facility]   PRAPARE - Transportation    In the past 12 months, has lack of transportation kept you from medical appointments or from getting medications?: No    Lack of Transportation (Non-Medical): No  Physical Activity: Inactive (07/29/2024)   Received from [facility]   Exercise Vital Sign    On average, how many days per week do you engage in moderate to strenuous exercise (like a brisk walk)?: 0 days    On average, how many minutes do you engage in exercise at this level?: 0 min  Stress: Stress Concern Present (07/29/2024)   Received from [facility] Occupational [facility] - Occupational Stress Questionnaire    Feeling of Stress : To some extent  Social Connections: Moderately Integrated (07/29/2024)   Received from [facility]   Social Connection and Isolation Panel    In a typical week, how many times do you talk on the phone with family, friends, or neighbors?: More than three times a week    How often do you get together with friends or relatives?: Twice a week    How often do you attend church or religious services?: More than 4 times per year    Do you belong to any clubs or organizations such as church groups,  unions, fraternal or athletic groups, or school groups?: Yes    How often do you attend meetings of the clubs or organizations you belong to?: More than 4 times per year    Are you married, widowed, divorced, separated, never married, or living with a partner?: Widowed  Intimate Partner Violence: Not At Risk (09/14/2023)   Humiliation, Afraid, Rape, and Kick questionnaire    Fear of Current or Ex-Partner: No    Emotionally Abused: No    Physically Abused: No    Sexually Abused: No    Family History  Problem Relation Age of Onset   Alzheimer's disease Mother    Cancer Father      Current Outpatient Medications:    ALPRAZolam (XANAX) 0.5 MG tablet, Take 0.5 mg by mouth daily., Disp: , Rfl:    amLODipine  (NORVASC ) 5 MG tablet, TAKE ONE (1) TABLET (5 MG TOTAL) BY MOUTH ONCE DAILY, Disp: , Rfl:    aspirin  EC 81 MG tablet, Take by mouth., Disp: , Rfl:    azelastine (OPTIVAR) 0.05 % ophthalmic solution, Apply to eye., Disp: , Rfl:    b complex vitamins tablet, Take 1 tablet by mouth daily., Disp: , Rfl:    Biotin 1 MG CAPS, Take 1 capsule by mouth daily., Disp: , Rfl:    folic acid  (FOLVITE ) 1 MG tablet, Take 1 mg by mouth., Disp: , Rfl:    methotrexate (RHEUMATREX) 2.5 MG tablet, Take 15 mg by mouth., Disp: , Rfl:    nitroGLYCERIN (NITROSTAT) 0.4 MG SL tablet, Place 1 tablet (0.4 mg total) under the tongue every 5 (five) minutes as needed May take up to 3 doses., Disp: , Rfl:    oxyCODONE  (OXY IR/ROXICODONE ) 5 MG immediate release tablet, Take 5 mg by mouth., Disp: , Rfl:    polyethylene glycol (MIRALAX / GLYCOLAX) 17 g packet, Take 1 packet (17 g total) by mouth once daily as needed (May start when taking PO) Mix in 4-8ounces of fluid prior to taking., Disp: , Rfl:    pregabalin (LYRICA) 50 MG capsule, Take 50 mg by mouth., Disp: , Rfl:  Semaglutide,0.25 or 0.5MG /DOS, 2 MG/3ML SOPN, Inject 0.25 mg into the skin., Disp: , Rfl:    senna-docusate (SENOKOT-S) 8.6-50 MG tablet, Take 1 tablet  by mouth., Disp: , Rfl:    sulfamethoxazole-trimethoprim (BACTRIM) 400-80 MG tablet, Take by mouth., Disp: , Rfl:    valsartan (DIOVAN) 80 MG tablet, Take 80 mg by mouth., Disp: , Rfl:    bumetanide  (BUMEX ) 1 MG tablet, Take 1 mg by mouth daily., Disp: , Rfl:    bumetanide  (BUMEX ) 2 MG tablet, Take by mouth daily., Disp: , Rfl:    carvedilol (COREG) 6.25 MG tablet, Take 6.25 mg by mouth 2 (two) times daily., Disp: , Rfl:    cephALEXin (KEFLEX) 500 MG capsule, Take 500 mg by mouth as needed. For dental appointments (Patient not taking: Reported on 09/14/2023), Disp: , Rfl:    Cholecalciferol  (VITAMIN D ) 50 MCG (2000 UT) tablet, Take 2,000 Units by mouth daily., Disp: , Rfl:    clopidogrel  (PLAVIX ) 75 MG tablet, Take by mouth., Disp: , Rfl:    Coenzyme Q10 (CO Q 10 PO), Take 1 tablet by mouth daily., Disp: , Rfl:    escitalopram  (LEXAPRO ) 10 MG tablet, Take 10 mg by mouth daily. (Patient not taking: Reported on 02/02/2024), Disp: , Rfl:    ezetimibe (ZETIA) 10 MG tablet, Take 10 mg by mouth daily., Disp: , Rfl:    FLUoxetine (PROZAC) 10 MG capsule, Take by mouth., Disp: , Rfl:    GVOKE HYPOPEN 2-PACK 1 MG/0.2ML SOAJ, Inject into the skin., Disp: , Rfl:    hydrALAZINE (APRESOLINE) 100 MG tablet, Take 100 mg by mouth 2 (two) times daily., Disp: , Rfl:    hydrOXYzine (ATARAX) 10 MG tablet, Take by mouth., Disp: , Rfl:    insulin  aspart (NOVOLOG ) 100 UNIT/ML injection, Inject 70-80 Units into the skin daily. Via OmniPod Insulin  pump, Disp: , Rfl:    Insulin  Disposable Pump (OMNIPOD DASH PODS, GEN 4,) MISC, Inject into the skin., Disp: , Rfl:    insulin  glargine (LANTUS  SOLOSTAR) 100 UNIT/ML Solostar Pen, In the event of insulin  pump failure, inject Lantus  41 units daily and restart the insulin  pump 24 hours after Lantus  is given., Disp: , Rfl:    Insulin  Pen Needle (BD PEN NEEDLE NANO 2ND GEN) 32G X 4 MM MISC, Inject insulin  as directed if the insulin  pump were to fail., Disp: , Rfl:    lamoTRIgine  (LAMICTAL) 200 MG tablet, Take 400 mg by mouth daily. At bedtime, Disp: , Rfl:    losartan  (COZAAR ) 100 MG tablet, Take 100 mg by mouth at bedtime., Disp: , Rfl:    Multiple Vitamin (MULTI-VITAMIN) tablet, Take 1 tablet by mouth every morning., Disp: , Rfl:    Multiple Vitamins-Minerals (MULTIVITAMIN WITH MINERALS) tablet, Take 1 tablet by mouth daily., Disp: , Rfl:    omeprazole (PRILOSEC) 20 MG capsule, Take 20 mg by mouth as needed. (Patient not taking: Reported on 09/14/2023), Disp: , Rfl:    pantoprazole  (PROTONIX ) 40 MG tablet, Take by mouth., Disp: , Rfl:    pravastatin  (PRAVACHOL ) 80 MG tablet, Take 80 mg by mouth at bedtime., Disp: , Rfl:    predniSONE  (DELTASONE ) 50 MG tablet, Take 1 tablet (50 mg total) by mouth daily with breakfast. Risk of hyperglycemia, monitor blood sugar closely, Disp: 5 tablet, Rfl: 0   rOPINIRole  (REQUIP ) 2 MG tablet, Take 2 mg by mouth at bedtime., Disp: , Rfl:    rOPINIRole  (REQUIP ) 4 MG tablet, Take 4 mg by mouth at bedtime., Disp: ,  Rfl:   Physical exam: There were no vitals filed for this visit. Physical Exam Cardiovascular:     Rate and Rhythm: Normal rate and regular rhythm.     Heart sounds: Normal heart sounds.  Pulmonary:     Effort: Pulmonary effort is normal.     Breath sounds: Normal breath sounds.  Skin:    General: Skin is warm and dry.  Neurological:     Mental Status: She is alert and oriented to person, place, and time.      I have personally reviewed labs listed below:    Latest Ref Rng & Units 09/14/2023    3:38 PM  CMP  Glucose 70 - 99 mg/dL 751   BUN 8 - 23 mg/dL 44   Creatinine 9.55 - 1.00 mg/dL 8.31   Sodium 864 - 854 mmol/L 136   Potassium 3.5 - 5.1 mmol/L 4.1   Chloride 98 - 111 mmol/L 104   CO2 22 - 32 mmol/L 23   Calcium  8.9 - 10.3 mg/dL 9.0   Total Protein 6.5 - 8.1 g/dL 7.4   Total Bilirubin 0.3 - 1.2 mg/dL 0.4   Alkaline Phos 38 - 126 U/L 108   AST 15 - 41 U/L 20   ALT 0 - 44 U/L 23       Latest Ref Rng &  Units 08/09/2024   11:15 AM  CBC  WBC 4.0 - 10.5 K/uL 7.7   Hemoglobin 12.0 - 15.0 g/dL 89.6   Hematocrit 63.9 - 46.0 % 31.3   Platelets 150 - 400 K/uL 191    I have personally reviewed Radiology images listed below: No images are attached to the encounter.  CT ABDOMEN PELVIS W WO CONTRAST Result Date: 08/04/2024 CLINICAL DATA:  History of left breast carcinoma. Additional evaluation. * Tracking Code: BO * EXAM: CT ABDOMEN AND PELVIS WITHOUT AND WITH CONTRAST TECHNIQUE: Multidetector CT imaging of the abdomen and pelvis was performed following the standard protocol before and following the bolus administration of intravenous contrast. RADIATION DOSE REDUCTION: This exam was performed according to the departmental dose-optimization program which includes automated exposure control, adjustment of the mA and/or kV according to patient size and/or use of iterative reconstruction technique. CONTRAST:  80mL OMNIPAQUE  IOHEXOL  300 MG/ML  SOLN COMPARISON:  CT scan chest from 07/07/2024. FINDINGS: Lower chest: The lung bases are clear. No pleural effusion. The heart is normal in size. No pericardial effusion. Partially seen right breast implant. There is mild circumferential thickening of the lower thoracic esophagus, which is most likely seen in the settings of chronic gastroesophageal reflux disease versus esophagitis. Hepatobiliary: The liver is normal in size. Non-cirrhotic configuration. No suspicious mass. No intrahepatic or extrahepatic bile duct dilation. No calcified gallstones. Normal gallbladder wall thickness. No pericholecystic inflammatory changes. Pancreas: There is mild a trophy of the pancreatic parenchyma. The entirety of the pancreas is nearly completely replaced by multifocal dilated main pancreatic duct as well as multiple cystic nonenhancing structures. The cystic nonenhancing structures may represent dilated pancreatic side branch or pancreatic side branch IPMN, differentiation is difficult  on this exam. There is no discrete enhancing mural nodule. There is area of focal scarring of the main pancreatic duct in the body/tail junction region, distal to which there is dilation of the main pancreatic duct in the tail measuring up to 9-10 mm. There are several pancreaticoliths in the body region. There is again dilation of the main pancreatic duct in the body/head region measuring up to 1.4 cm in diameter.  There is an additional dystrophic parenchymal calcification in the head/uncinate process region. No peripancreatic fat stranding. Findings favor sequela of chronic pancreatitis. Spleen: Within normal limits. No focal lesion. Adrenals/Urinary Tract: Adrenal glands are unremarkable. There are simple renal cysts in bilateral kidneys with the largest arising from the left kidney lower pole, anterolaterally measuring 2.1 x 2.3 cm. There is a smaller cyst in the right kidney upper pole measuring 1.3 x 1.5 cm. There are additional sub 5 mm hypoattenuating structures in bilateral kidneys, which are too small to adequately characterize. There is also an approximately 1.2 x 1.7 cm nonenhancing hyperdense structure in the right kidney interpolar region, posterolaterally, favored to represent proteinaceous/hemorrhagic cyst. There is an additional smaller similar characteristic structure in the left kidney interpolar region, laterally as well. There are at least 3, 1-1.5 mm nonobstructing calculi in the left kidney. No other nephroureterolithiasis or obstructive uropathy on either side. Urinary bladder is under distended, precluding optimal assessment. However, no large mass or stones identified. No perivesical fat stranding. Stomach/Bowel: There is a tiny sliding hiatal hernia. No disproportionate dilation of the small or large bowel loops. No evidence of abnormal bowel wall thickening or inflammatory changes. The appendix is unremarkable. Vascular/Lymphatic: No ascites or pneumoperitoneum. No abdominal or pelvic  lymphadenopathy, by size criteria. No aneurysmal dilation of the major abdominal arteries. There are moderate peripheral atherosclerotic vascular calcifications of the aorta and its major branches. Reproductive: The uterus is surgically absent. No large adnexal mass. Other: There are small fat containing umbilical and right inguinal hernias. The soft tissues and abdominal wall are otherwise unremarkable. Musculoskeletal: No suspicious osseous lesions. There are mild - moderate multilevel degenerative changes in the visualized spine. There is mild anterior wedging deformity of T12 vertebrae, similar to the prior study. IMPRESSION: 1. No metastatic disease identified within the abdomen or pelvis. 2. There is near complete replacement of the pancreatic parenchyma by multiple dilated main pancreatic duct and cystic nonenhancing structures, as described above. There is no discrete enhancing mural nodule. Findings favor sequela of chronic pancreatitis. 3. Multiple other nonacute observations, as described above. Aortic Atherosclerosis (ICD10-I70.0). Electronically Signed   By: Ree Molt M.D.   On: 08/04/2024 12:49     Assessment and plan- Patient is a 78 y.o. female here for routine follow-up of anemia likely multifactorial secondary to chronic kidney disease, anemia of chronic disease as well as iron deficiency  Patient's hemoglobin has been stable around 10 for the last year and a half.  Iron studies from today are pending.  Goal is to keep her [place] ferritin close to 200 and iron saturation around 20%.  She also has a small amount of IgM MGUS which we will follow on a yearly basis.  CBC ferritin and iron studies in 4 and 8 months and I will see her back in 8 months.  Also get myeloma panel and serum free light chains at that time   Visit Diagnosis 1. Anemia of chronic kidney failure, stage 3 (moderate) (HCC)   2. MGUS (monoclonal gammopathy of unknown significance)      Dr. Annah Skene, MD,  [facility] [phone] 08/09/2024 11:35 AM

## 2024-08-10 LAB — KAPPA/LAMBDA LIGHT CHAINS
Kappa free light chain: 54.8 mg/L — ABNORMAL HIGH (ref 3.3–19.4)
Kappa, lambda light chain ratio: 1.55 (ref 0.26–1.65)
Lambda free light chains: 35.3 mg/L — ABNORMAL HIGH (ref 5.7–26.3)

## 2024-08-11 LAB — MULTIPLE MYELOMA PANEL, SERUM
Albumin SerPl Elph-Mcnc: 3.4 g/dL (ref 2.9–4.4)
Albumin/Glob SerPl: 1.3 (ref 0.7–1.7)
Alpha 1: 0.2 g/dL (ref 0.0–0.4)
Alpha2 Glob SerPl Elph-Mcnc: 0.8 g/dL (ref 0.4–1.0)
B-Globulin SerPl Elph-Mcnc: 0.8 g/dL (ref 0.7–1.3)
Gamma Glob SerPl Elph-Mcnc: 0.8 g/dL (ref 0.4–1.8)
Globulin, Total: 2.7 g/dL (ref 2.2–3.9)
IgA: 210 mg/dL (ref 64–422)
IgG (Immunoglobin G), Serum: 814 mg/dL (ref 586–1602)
IgM (Immunoglobulin M), Srm: 208 mg/dL (ref 26–217)
M Protein SerPl Elph-Mcnc: 0.3 g/dL — ABNORMAL HIGH
Total Protein ELP: 6.1 g/dL (ref 6.0–8.5)

## 2024-10-07 ENCOUNTER — Emergency Department

## 2024-10-07 ENCOUNTER — Inpatient Hospital Stay
Admission: EM | Admit: 2024-10-07 | Discharge: 2024-10-09 | DRG: 291 | Disposition: A | Discharge status: Other Acute Inpatient Hospital | Attending: Internal Medicine | Admitting: Internal Medicine

## 2024-10-07 ENCOUNTER — Other Ambulatory Visit: Payer: Self-pay

## 2024-10-07 DIAGNOSIS — Z7902 Long term (current) use of antithrombotics/antiplatelets: Secondary | ICD-10-CM | POA: Diagnosis not present

## 2024-10-07 DIAGNOSIS — Z96611 Presence of right artificial shoulder joint: Secondary | ICD-10-CM | POA: Diagnosis present

## 2024-10-07 DIAGNOSIS — Z79899 Other long term (current) drug therapy: Secondary | ICD-10-CM | POA: Diagnosis not present

## 2024-10-07 DIAGNOSIS — M810 Age-related osteoporosis without current pathological fracture: Secondary | ICD-10-CM | POA: Diagnosis present

## 2024-10-07 DIAGNOSIS — Z888 Allergy status to other drugs, medicaments and biological substances status: Secondary | ICD-10-CM

## 2024-10-07 DIAGNOSIS — E063 Autoimmune thyroiditis: Secondary | ICD-10-CM | POA: Diagnosis present

## 2024-10-07 DIAGNOSIS — Z9981 Dependence on supplemental oxygen: Secondary | ICD-10-CM | POA: Diagnosis not present

## 2024-10-07 DIAGNOSIS — Z794 Long term (current) use of insulin: Secondary | ICD-10-CM

## 2024-10-07 DIAGNOSIS — N179 Acute kidney failure, unspecified: Secondary | ICD-10-CM | POA: Diagnosis present

## 2024-10-07 DIAGNOSIS — Z7952 Long term (current) use of systemic steroids: Secondary | ICD-10-CM

## 2024-10-07 DIAGNOSIS — N1832 Chronic kidney disease, stage 3b: Secondary | ICD-10-CM | POA: Diagnosis present

## 2024-10-07 DIAGNOSIS — I2510 Atherosclerotic heart disease of native coronary artery without angina pectoris: Secondary | ICD-10-CM | POA: Diagnosis not present

## 2024-10-07 DIAGNOSIS — F329 Major depressive disorder, single episode, unspecified: Secondary | ICD-10-CM | POA: Diagnosis present

## 2024-10-07 DIAGNOSIS — E785 Hyperlipidemia, unspecified: Secondary | ICD-10-CM | POA: Diagnosis present

## 2024-10-07 DIAGNOSIS — E1043 Type 1 diabetes mellitus with diabetic autonomic (poly)neuropathy: Secondary | ICD-10-CM | POA: Diagnosis present

## 2024-10-07 DIAGNOSIS — L259 Unspecified contact dermatitis, unspecified cause: Secondary | ICD-10-CM | POA: Diagnosis present

## 2024-10-07 DIAGNOSIS — Z5329 Procedure and treatment not carried out because of patient's decision for other reasons: Secondary | ICD-10-CM | POA: Diagnosis present

## 2024-10-07 DIAGNOSIS — E1022 Type 1 diabetes mellitus with diabetic chronic kidney disease: Secondary | ICD-10-CM | POA: Diagnosis present

## 2024-10-07 DIAGNOSIS — Z853 Personal history of malignant neoplasm of breast: Secondary | ICD-10-CM

## 2024-10-07 DIAGNOSIS — Z820 Family history of epilepsy and other diseases of the nervous system: Secondary | ICD-10-CM

## 2024-10-07 DIAGNOSIS — I5033 Acute on chronic diastolic (congestive) heart failure: Secondary | ICD-10-CM | POA: Diagnosis present

## 2024-10-07 DIAGNOSIS — Z9013 Acquired absence of bilateral breasts and nipples: Secondary | ICD-10-CM

## 2024-10-07 DIAGNOSIS — Z90710 Acquired absence of both cervix and uterus: Secondary | ICD-10-CM

## 2024-10-07 DIAGNOSIS — J9611 Chronic respiratory failure with hypoxia: Secondary | ICD-10-CM | POA: Diagnosis present

## 2024-10-07 DIAGNOSIS — G4733 Obstructive sleep apnea (adult) (pediatric): Secondary | ICD-10-CM | POA: Diagnosis present

## 2024-10-07 DIAGNOSIS — I509 Heart failure, unspecified: Secondary | ICD-10-CM | POA: Diagnosis not present

## 2024-10-07 DIAGNOSIS — Z9641 Presence of insulin pump (external) (internal): Secondary | ICD-10-CM | POA: Diagnosis present

## 2024-10-07 DIAGNOSIS — G2581 Restless legs syndrome: Secondary | ICD-10-CM | POA: Diagnosis present

## 2024-10-07 DIAGNOSIS — Z7985 Long-term (current) use of injectable non-insulin antidiabetic drugs: Secondary | ICD-10-CM

## 2024-10-07 DIAGNOSIS — I130 Hypertensive heart and chronic kidney disease with heart failure and stage 1 through stage 4 chronic kidney disease, or unspecified chronic kidney disease: Secondary | ICD-10-CM | POA: Diagnosis present

## 2024-10-07 DIAGNOSIS — Z952 Presence of prosthetic heart valve: Secondary | ICD-10-CM

## 2024-10-07 DIAGNOSIS — J9621 Acute and chronic respiratory failure with hypoxia: Principal | ICD-10-CM

## 2024-10-07 DIAGNOSIS — K219 Gastro-esophageal reflux disease without esophagitis: Secondary | ICD-10-CM | POA: Diagnosis present

## 2024-10-07 DIAGNOSIS — Z8673 Personal history of transient ischemic attack (TIA), and cerebral infarction without residual deficits: Secondary | ICD-10-CM

## 2024-10-07 DIAGNOSIS — Z7982 Long term (current) use of aspirin: Secondary | ICD-10-CM

## 2024-10-07 DIAGNOSIS — K3184 Gastroparesis: Secondary | ICD-10-CM | POA: Diagnosis present

## 2024-10-07 LAB — URINALYSIS, ROUTINE W REFLEX MICROSCOPIC
Bilirubin Urine: NEGATIVE
Glucose, UA: NEGATIVE mg/dL
Hgb urine dipstick: NEGATIVE
Ketones, ur: NEGATIVE mg/dL
Nitrite: NEGATIVE
Protein, ur: NEGATIVE mg/dL
Specific Gravity, Urine: 1.008 (ref 1.005–1.030)
pH: 5 (ref 5.0–8.0)

## 2024-10-07 LAB — COMPREHENSIVE METABOLIC PANEL WITH GFR
ALT: 31 U/L (ref 0–44)
AST: 29 U/L (ref 15–41)
Albumin: 4.1 g/dL (ref 3.5–5.0)
Alkaline Phosphatase: 143 U/L — ABNORMAL HIGH (ref 38–126)
Anion gap: 15 (ref 5–15)
BUN: 96 mg/dL — ABNORMAL HIGH (ref 8–23)
CO2: 25 mmol/L (ref 22–32)
Calcium: 8.3 mg/dL — ABNORMAL LOW (ref 8.9–10.3)
Chloride: 101 mmol/L (ref 98–111)
Creatinine, Ser: 2.48 mg/dL — ABNORMAL HIGH (ref 0.44–1.00)
GFR, Estimated: 19 mL/min — ABNORMAL LOW (ref >60)
Glucose, Bld: 175 mg/dL — ABNORMAL HIGH (ref 70–99)
Potassium: 4.1 mmol/L (ref 3.5–5.1)
Sodium: 141 mmol/L (ref 135–145)
Total Bilirubin: 0.3 mg/dL (ref 0.0–1.2)
Total Protein: 6.7 g/dL (ref 6.5–8.1)

## 2024-10-07 LAB — CBC
HCT: 30 % — ABNORMAL LOW (ref 36.0–46.0)
HCT: 27.4 % — ABNORMAL LOW (ref 36.0–46.0)
Hemoglobin: 9.6 g/dL — ABNORMAL LOW (ref 12.0–15.0)
Hemoglobin: 9 g/dL — ABNORMAL LOW (ref 12.0–15.0)
MCH: 31.3 pg (ref 26.0–34.0)
MCH: 32 pg (ref 26.0–34.0)
MCHC: 32 g/dL (ref 30.0–36.0)
MCHC: 32.8 g/dL (ref 30.0–36.0)
MCV: 97.7 fL (ref 80.0–100.0)
MCV: 97.5 fL (ref 80.0–100.0)
Platelets: 143 K/uL — ABNORMAL LOW (ref 150–400)
Platelets: 140 K/uL — ABNORMAL LOW (ref 150–400)
RBC: 3.07 MIL/uL — ABNORMAL LOW (ref 3.87–5.11)
RBC: 2.81 MIL/uL — ABNORMAL LOW (ref 3.87–5.11)
RDW: 14.4 % (ref 11.5–15.5)
RDW: 14.3 % (ref 11.5–15.5)
WBC: 7.8 K/uL (ref 4.0–10.5)
WBC: 6.1 K/uL (ref 4.0–10.5)
nRBC: 0 % (ref 0.0–0.2)
nRBC: 0 % (ref 0.0–0.2)

## 2024-10-07 LAB — CREATININE, SERUM
Creatinine, Ser: 2.37 mg/dL — ABNORMAL HIGH (ref 0.44–1.00)
GFR, Estimated: 21 mL/min — ABNORMAL LOW (ref >60)

## 2024-10-07 LAB — TROPONIN T, HIGH SENSITIVITY
Troponin T High Sensitivity: 59 ng/L — ABNORMAL HIGH (ref 0–19)
Troponin T High Sensitivity: 53 ng/L — ABNORMAL HIGH (ref 0–19)

## 2024-10-07 LAB — PRO BRAIN NATRIURETIC PEPTIDE: Pro Brain Natriuretic Peptide: 1995 pg/mL — ABNORMAL HIGH (ref <300.0)

## 2024-10-07 LAB — PROTIME-INR
INR: 1.1 (ref 0.8–1.2)
Prothrombin Time: 14.5 s (ref 11.4–15.2)

## 2024-10-07 LAB — GLUCOSE, CAPILLARY: Glucose-Capillary: 80 mg/dL (ref 70–99)

## 2024-10-07 LAB — CBG MONITORING, ED: Glucose-Capillary: 104 mg/dL — ABNORMAL HIGH (ref 70–99)

## 2024-10-07 LAB — CREATININE, URINE, RANDOM: Creatinine, Urine: 29 mg/dL

## 2024-10-07 MED ORDER — POLYETHYLENE GLYCOL 3350 17 G PO PACK
17.0000 g | PACK | Freq: Every day | ORAL | Status: DC | PRN
Start: 2024-10-07 — End: 2024-10-09

## 2024-10-07 MED ORDER — ACETAMINOPHEN 325 MG PO TABS
650.0000 mg | ORAL_TABLET | ORAL | Status: DC | PRN
  Administered 2024-10-09: 650 mg via ORAL
  Filled 2024-10-07: qty 2

## 2024-10-07 MED ORDER — PREGABALIN 50 MG PO CAPS
50.0000 mg | ORAL_CAPSULE | Freq: Two times a day (BID) | ORAL | Status: DC
  Administered 2024-10-07 – 2024-10-08 (×3): 50 mg via ORAL
  Filled 2024-10-07 (×3): qty 1

## 2024-10-07 MED ORDER — SODIUM CHLORIDE 0.9 % IV SOLN: 250.0000 mL | INTRAVENOUS | Status: AC | PRN

## 2024-10-07 MED ORDER — DIPHENHYDRAMINE HCL 25 MG PO CAPS
25.0000 mg | ORAL_CAPSULE | Freq: Four times a day (QID) | ORAL | Status: DC | PRN
  Filled 2024-10-07: qty 1

## 2024-10-07 MED ORDER — PANTOPRAZOLE SODIUM 40 MG PO TBEC
40.0000 mg | DELAYED_RELEASE_TABLET | Freq: Every day | ORAL | Status: DC
  Administered 2024-10-08 – 2024-10-09 (×2): 40 mg via ORAL
  Filled 2024-10-07 (×2): qty 1

## 2024-10-07 MED ORDER — HEPARIN SODIUM (PORCINE) 5000 UNIT/ML IJ SOLN
5000.0000 [IU] | Freq: Three times a day (TID) | INTRAMUSCULAR | Status: DC
  Administered 2024-10-07 – 2024-10-09 (×6): 5000 [IU] via SUBCUTANEOUS
  Filled 2024-10-07 (×6): qty 1

## 2024-10-07 MED ORDER — BUMETANIDE 0.25 MG/ML IJ SOLN
1.0000 mg | Freq: Two times a day (BID) | INTRAMUSCULAR | Status: DC
  Administered 2024-10-07 – 2024-10-08 (×3): 1 mg via INTRAVENOUS
  Filled 2024-10-07 (×6): qty 4

## 2024-10-07 MED ORDER — EZETIMIBE 10 MG PO TABS
10.0000 mg | ORAL_TABLET | Freq: Every day | ORAL | Status: DC
  Administered 2024-10-08 – 2024-10-09 (×2): 10 mg via ORAL
  Filled 2024-10-07 (×2): qty 1

## 2024-10-07 MED ORDER — SENNOSIDES-DOCUSATE SODIUM 8.6-50 MG PO TABS: 2.0000 | ORAL_TABLET | Freq: Every evening | ORAL | Status: DC | PRN

## 2024-10-07 MED ORDER — ONDANSETRON HCL 4 MG/2ML IJ SOLN: 4.0000 mg | Freq: Four times a day (QID) | INTRAMUSCULAR | Status: DC | PRN

## 2024-10-07 MED ORDER — OXYCODONE HCL 5 MG PO TABS
10.0000 mg | ORAL_TABLET | Freq: Once | ORAL | Status: AC
  Administered 2024-10-07: 10 mg via ORAL
  Filled 2024-10-07: qty 2

## 2024-10-07 MED ORDER — CLOPIDOGREL BISULFATE 75 MG PO TABS
75.0000 mg | ORAL_TABLET | Freq: Every day | ORAL | Status: DC
  Administered 2024-10-08 – 2024-10-09 (×2): 75 mg via ORAL
  Filled 2024-10-07 (×2): qty 1

## 2024-10-07 MED ORDER — BUMETANIDE 0.25 MG/ML IJ SOLN
1.0000 mg | Freq: Once | INTRAMUSCULAR | Status: AC
  Administered 2024-10-07: 1 mg via INTRAVENOUS
  Filled 2024-10-07: qty 4

## 2024-10-07 MED ORDER — FLUOXETINE HCL 10 MG PO CAPS: 10.0000 mg | ORAL_CAPSULE | Freq: Every day | ORAL | Status: DC

## 2024-10-07 MED ORDER — INSULIN ASPART 100 UNIT/ML ~~LOC~~ SOLN: 70.0000 [IU] | Freq: Every day | SUBCUTANEOUS | Status: DC

## 2024-10-07 MED ORDER — PRAVASTATIN SODIUM 40 MG PO TABS
80.0000 mg | ORAL_TABLET | Freq: Every day | ORAL | Status: DC
  Administered 2024-10-07 – 2024-10-08 (×2): 80 mg via ORAL
  Filled 2024-10-07 (×2): qty 2

## 2024-10-07 MED ORDER — OXYCODONE HCL 5 MG PO TABS: 5.0000 mg | ORAL_TABLET | ORAL | Status: DC | PRN

## 2024-10-07 MED ORDER — SODIUM CHLORIDE 0.9% FLUSH: 3.0000 mL | INTRAVENOUS | Status: DC | PRN

## 2024-10-07 MED ORDER — OXYCODONE HCL 5 MG PO TABS
10.0000 mg | ORAL_TABLET | ORAL | Status: DC | PRN
  Administered 2024-10-07: 10 mg via ORAL
  Filled 2024-10-07: qty 2

## 2024-10-07 MED ORDER — ASPIRIN 81 MG PO TBEC
81.0000 mg | DELAYED_RELEASE_TABLET | Freq: Every day | ORAL | Status: DC
  Administered 2024-10-08 – 2024-10-09 (×2): 81 mg via ORAL
  Filled 2024-10-07 (×2): qty 1

## 2024-10-07 MED ORDER — CARVEDILOL 6.25 MG PO TABS
6.2500 mg | ORAL_TABLET | Freq: Two times a day (BID) | ORAL | Status: DC
  Administered 2024-10-07 – 2024-10-08 (×3): 6.25 mg via ORAL
  Filled 2024-10-07 (×3): qty 1

## 2024-10-07 MED ORDER — INSULIN PUMP
Freq: Three times a day (TID) | SUBCUTANEOUS | Status: DC
  Administered 2024-10-08: 2.85 via SUBCUTANEOUS
  Filled 2024-10-07: qty 1

## 2024-10-07 MED ORDER — SODIUM CHLORIDE 0.9% FLUSH
3.0000 mL | Freq: Two times a day (BID) | INTRAVENOUS | Status: DC
  Administered 2024-10-07 – 2024-10-09 (×5): 3 mL via INTRAVENOUS

## 2024-10-07 MED ORDER — ALPRAZOLAM 0.5 MG PO TABS
0.5000 mg | ORAL_TABLET | Freq: Every day | ORAL | Status: DC
  Filled 2024-10-07: qty 1

## 2024-10-07 MED ORDER — ROPINIROLE HCL 1 MG PO TABS
2.0000 mg | ORAL_TABLET | Freq: Every day | ORAL | Status: DC
  Administered 2024-10-07 – 2024-10-08 (×2): 2 mg via ORAL
  Filled 2024-10-07 (×3): qty 2

## 2024-10-07 NOTE — ED Triage Notes (Signed)
 Patients comes in from home via [facility] with complaints of shortness of breath. Pt was seen by her cardiologist 2 days ago, and was given iv lasix with no relief. Pt with swelling to both legs. Pt complains of pain all over her body 10/10. Pt has a history CHF, has a pacemaker, stage 4 kidney disease, and had EF of 18%. Pt states that she is unable to walk do to the pain and swelling in her feet and ankles.  Pt is alert and oriented x4 with no signs of acute distress at this time.

## 2024-10-07 NOTE — ED Notes (Signed)
 Pt changed new brief and repositioned by Ronal armin Mould, NT. Pt tolerated well. No further needs expressed. CB within recah

## 2024-10-07 NOTE — ED Notes (Signed)
 Alexander, MD aware of trending Low diastolic pressures. No interventions needed. NAD, CB within reach.

## 2024-10-07 NOTE — H&P (Signed)
 HISTORY AND PHYSICAL    Lindsay Glenn   FMW:[id number] DOB: Jun 01, 1946   Date of Service: 10/07/24 Requesting physician/APP from ED: Treatment Team:  Attending Provider: Jossie Artist POUR, MD  PCP: Epifanio Alm SQUIBB, MD  Lindsay Glenn is a 78 y.o. 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 On Plavix  + ASA, intolerant to high potency statin so is on pravastatin  Renal: CKD3b baseline Cr  Resp: restrictive lung disease, chronic hypoxic respiratory failureon 2L O2 [place] at home Misc: T1DM on pump insulin , OSA w/ hx intolerant to CPAP, Hx breast cancer, GERD, MDD, hypothyroid.  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 Described as getting winded easily, unable to lie doen flat,difficult to perform ADL. Associated with LE edema. Was at cardiac rehab Weds and SOB, they got her in w/ her cardiologist that afternoon who in creased [place], she called them Thursday and did not receive a call back which she found frustrating, she came in today Friday to the ED. She also notes itching scattered rash, no new exposures, has not tried anything for this. .    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  HTN HLD Continue diuresis w/ Bumex  (noted allergy to Lasix w/ itching but tolerates Bumex  fine)  Strict I&O, fluid restriction, daily weights  GDMT as BP and renal fxn allows, continue home coreg  ASA, statin, zetia , plavix   Echo  Cardiology consult - pt follows w/ [facility] outpatient, will discuss here w/ [facility]   AKI on CKD3a Likely prerenal d/t cardiorenal syndrome  Calculate FeUr to confirm - urine studies pending  Monitor BMP Low threshold to consult nephrology if not improving   Rash c/w contact dermatitis At this time do not suspect drug rash but she has started methotrexate recently Symptom management benadryl  Skin check daily  Hx CVA ASA, Plavix , statin   IDDM, Type 1 on insulin  pump  Continue insulin  pump Diabetes nurse consult   Nonspecific autoimmune / inflammatory arthritis see rheumatology notes Hold methotrexate for now Pain control       DVT prophylaxis: heparin  Pertinent IV fluids/nutrition: no fluids, carb/cardiac diet w/ fluid restrictions Central lines / invasive devices: none  Code Status: FULL CODE - pt states she would want CPR attempted and trial life support if needed, she volunteers that she would never want a feeding tube Family Communication: none at bedside  Disposition: inpatient, not needing O2 and diuresing well so ordered for telemetry and not for progressive unit TOC needs: TBD Barriers to discharge / significant pending items: diuresing, pend echo, pend cardio consult  Review of Systems:  Review of Systems  Constitutional:  Positive for malaise/fatigue. Negative for chills, diaphoresis and fever.  HENT:  Negative for congestion and sore throat.   Respiratory:  Positive for shortness of breath. Negative for cough, hemoptysis, sputum production and wheezing.   Cardiovascular:  Positive  for orthopnea, leg swelling and PND. Negative for chest pain, palpitations and claudication.  Gastrointestinal:  Negative for abdominal pain, constipation, diarrhea, heartburn and nausea.  Genitourinary:  Negative for urgency.  Musculoskeletal:  Positive for joint pain and myalgias.  Skin:  Positive for itching and rash.  Neurological:  Positive for tingling (chronic deficits from shingles) and weakness. Negative for dizziness, speech change, focal weakness and loss of consciousness.  Psychiatric/Behavioral:  Negative for depression.        has a past medical history of (HFpEF) heart failure with preserved ejection fraction (HCC), Aortic stenosis, Arthritis, BILATERAL rotator cuff tendinitis, CHF (congestive heart failure) (HCC), CKD (chronic kidney disease), stage III (HCC), Complication of anesthesia, CTS (carpal tunnel syndrome), Depression, DOE (dyspnea on exertion), Dyspnea, Gastroparesis, GERD (gastroesophageal reflux disease), Hashimoto's thyroiditis, Heart murmur, History of hiatal hernia, HLD (hyperlipidemia), Hypertension, IDA (iron deficiency anemia), Insomnia, LADA (latent autoimmune diabetes in adults), managed as type 1 (HCC), Obesity, Osteopenia, Osteoporosis, Pancreatic cyst, Pneumonia, PONV (postoperative nausea and vomiting), Pseudophakia of both eyes, Renal cyst, right, Restless leg syndrome, Sleep apnea, Stroke (HCC) (2017), and TIA (transient ischemic attack) (2017).  No current facility-administered medications on file prior to encounter.   Current Outpatient Medications on File Prior to Encounter  Medication Sig Dispense Refill   ALPRAZolam  (XANAX ) 0.5 MG tablet Take 0.5 mg by mouth daily.     amLODipine  (NORVASC ) 5 MG tablet TAKE ONE (1) TABLET (5 MG TOTAL) BY MOUTH ONCE DAILY     aspirin  EC 81 MG tablet Take by mouth.     azelastine (OPTIVAR) 0.05 % ophthalmic solution Apply to eye.     b complex vitamins tablet Take 1 tablet by mouth daily.     Biotin 1 MG CAPS Take 1  capsule by mouth daily.     bumetanide  (BUMEX ) 1 MG tablet Take 1 mg by mouth daily.     bumetanide  (BUMEX ) 2 MG tablet Take by mouth daily.     carvedilol  (COREG ) 6.25 MG tablet Take 6.25 mg by mouth 2 (two) times daily.     cephALEXin (KEFLEX) 500 MG capsule Take 500 mg by mouth as needed. For dental appointments (Patient not taking: Reported on 09/14/2023)     Cholecalciferol  (VITAMIN D ) 50 MCG (2000 UT) tablet Take 2,000 Units by mouth daily.     clopidogrel  (PLAVIX ) 75 MG tablet Take by mouth.     Coenzyme Q10 (CO Q 10 PO) Take 1 tablet by mouth daily.     escitalopram  (LEXAPRO ) 10 MG tablet Take 10 mg by mouth daily. (Patient not taking: Reported on 02/02/2024)     ezetimibe  (ZETIA ) 10 MG tablet Take 10 mg by mouth daily.     FLUoxetine  (PROZAC ) 10 MG capsule Take by mouth.     folic acid  (FOLVITE ) 1 MG tablet Take 1 mg by mouth.     GVOKE HYPOPEN 2-PACK 1 MG/0.2ML SOAJ Inject into the skin. (Patient not taking: Reported on 08/09/2024)     hydrALAZINE (APRESOLINE) 100 MG tablet Take 100 mg by mouth 2 (two) times daily.     hydrOXYzine (ATARAX) 10 MG tablet Take by mouth.     insulin  aspart (NOVOLOG ) 100 UNIT/ML injection Inject 70-80 Units  into the skin daily. Via OmniPod Insulin  pump     Insulin  Disposable Pump (OMNIPOD DASH PODS, GEN 4,) MISC Inject into the skin.     insulin  glargine (LANTUS  SOLOSTAR) 100 UNIT/ML Solostar Pen In the event of insulin  pump failure, inject Lantus  41 units daily and restart the insulin  pump 24 hours after Lantus  is given.     Insulin  Pen Needle (BD PEN NEEDLE NANO 2ND GEN) 32G X 4 MM MISC Inject insulin  as directed if the insulin  pump were to fail.     lamoTRIgine (LAMICTAL) 200 MG tablet Take 400 mg by mouth daily. At bedtime     losartan  (COZAAR ) 100 MG tablet Take 100 mg by mouth at bedtime. (Patient not taking: Reported on 08/09/2024)     Multiple Vitamin (MULTI-VITAMIN) tablet Take 1 tablet by mouth every morning.     Multiple Vitamins-Minerals  (MULTIVITAMIN WITH MINERALS) tablet Take 1 tablet by mouth daily.     nitroGLYCERIN (NITROSTAT) 0.4 MG SL tablet Place 1 tablet (0.4 mg total) under the tongue every 5 (five) minutes as needed May take up to 3 doses.     omeprazole (PRILOSEC) 20 MG capsule Take 20 mg by mouth as needed. (Patient not taking: Reported on 09/14/2023)     oxyCODONE  (OXY IR/ROXICODONE ) 5 MG immediate release tablet Take 5 mg by mouth.     pantoprazole  (PROTONIX ) 40 MG tablet Take by mouth.     polyethylene glycol (MIRALAX  / GLYCOLAX ) 17 g packet Take 1 packet (17 g total) by mouth once daily as needed (May start when taking PO) Mix in 4-8ounces of fluid prior to taking.     pravastatin  (PRAVACHOL ) 80 MG tablet Take 80 mg by mouth at bedtime.     predniSONE  (DELTASONE ) 50 MG tablet Take 1 tablet (50 mg total) by mouth daily with breakfast. Risk of hyperglycemia, monitor blood sugar closely 5 tablet 0   pregabalin  (LYRICA ) 50 MG capsule Take 50 mg by mouth.     rOPINIRole  (REQUIP ) 2 MG tablet Take 2 mg by mouth at bedtime.     rOPINIRole  (REQUIP ) 4 MG tablet Take 4 mg by mouth at bedtime.     Semaglutide,0.25 or 0.5MG /DOS, 2 MG/3ML SOPN Inject 0.25 mg into the skin.     senna-docusate (SENOKOT-S) 8.6-50 MG tablet Take 1 tablet by mouth.     valsartan (DIOVAN) 80 MG tablet Take 80 mg by mouth.      reports that she has never smoked. She has never used smokeless tobacco. She reports current alcohol use. She reports that she does not use drugs.   Allergies  Allergen Reactions   Empagliflozin Nausea And Vomiting and Other (See Comments)    Decreased energy (Jardiance)   Silicone Other (See Comments)    Causes rash, blistering( if on for several days)   Paper tape and tegaderm  is OK briefly   Tape Other (See Comments)    Causes rash, blistering( if on for several days)   Paper tape and tegaderm  is OK   Gabapentin Other (See Comments)   Atorvastatin Other (See Comments)    Increases blood sugar   Furosemide Itching  and Rash   Lisinopril Cough   Rosuvastatin  Other (See Comments)    Increases blood sugar   Trulicity [Dulaglutide] Diarrhea and Nausea And Vomiting      family history includes Alzheimer's disease in her mother; Cancer in her father. Past Surgical History:  Procedure Laterality Date   ABDOMINAL HYSTERECTOMY  1994  ANKLE FRACTURE SURGERY Right 2004   screws;plate   BRACHIOPLASTY Bilateral 2018   fat removal from both upper arms   BREAST SURGERY  2000   bilateral mastectomy   CARPAL TUNNEL RELEASE Right 2004   CARPAL TUNNEL RELEASE Right 04/04/2020   Procedure: OPEN CARPAL TUNNEL RELEASE;  Surgeon: Edie Norleen PARAS, MD;  Location: [facility] ORS;  Service: Orthopedics;  Laterality: Right;   COLONOSCOPY     FRACTURE SURGERY     JOINT REPLACEMENT Bilateral    knee   MASTECTOMY     SHOULDER ARTHROSCOPY Right 2016   TOTAL SHOULDER ARTHROPLASTY Right 09/05/2021   Procedure: TOTAL SHOULDER ARTHROPLASTY;  Surgeon: Edie Norleen PARAS, MD;  Location: [facility] ORS;  Service: Orthopedics;  Laterality: Right;   UPPER ESOPHAGEAL ENDOSCOPIC ULTRASOUND (EUS)  2020   pancreas          Objective Findings:  Vitals:   10/07/24 0946 10/07/24 1000 10/07/24 1015 10/07/24 1030  BP:  (!) 151/62  (!) 135/48  Pulse:  64 63 63  Resp:  14 16 20   Temp:      SpO2:  97% 99% 98%  Weight: 90.4 kg     Height: 5' (1.524 m)      No intake or output data in the 24 hours ending 10/07/24 1131 Filed Weights   10/07/24 0946  Weight: 90.4 kg    Examination:  Physical Exam Constitutional:      General: She is not in acute distress. Cardiovascular:     Rate and Rhythm: Normal rate and regular rhythm.  Pulmonary:     Breath sounds: Examination of the right-lower field reveals rales. Examination of the left-lower field reveals rales. Rales present.  Musculoskeletal:     Right lower leg: Edema (+1) present.     Left lower leg: Edema (+1) present.  Skin:    General: Skin is warm and dry.     Findings: Rash  (scattred papular rash worse on legs but present on arms and few spots on abdomen) present.  Neurological:     Mental Status: She is alert and oriented to person, place, and time.  Psychiatric:        Mood and Affect: Mood normal.        Behavior: Behavior normal.          Scheduled Medications:   bumetanide  (BUMEX ) IV  1 mg Intravenous Once    Continuous Infusions:   PRN Medications:    Antimicrobials:  Anti-infectives (From admission, onward)    None           Data Reviewed: I have personally reviewed following labs and imaging studies  CBC: Recent Labs  Lab 10/07/24 0948  WBC 7.8  HGB 9.6*  HCT 30.0*  MCV 97.7  PLT 143*   Basic Metabolic Panel: Recent Labs  Lab 10/07/24 0958  NA 141  K 4.1  CL 101  CO2 25  GLUCOSE 175*  BUN 96*  CREATININE 2.48*  CALCIUM  8.3*   GFR: Estimated Creatinine Clearance: 19 mL/min (A) (by C-G formula based on SCr of 2.48 mg/dL (H)). Liver Function Tests: Recent Labs  Lab 10/07/24 0958  AST 29  ALT 31  ALKPHOS 143*  BILITOT 0.3  PROT 6.7  ALBUMIN 4.1   No results for input(s): LIPASE, AMYLASE in the last 168 hours. No results for input(s): AMMONIA in the last 168 hours. Coagulation Profile: Recent Labs  Lab 10/07/24 0948  INR 1.1   Cardiac Enzymes: No results for input(s): CKTOTAL,  CKMB, CKMBINDEX, TROPONINI in the last 168 hours. BNP (last 3 results) Recent Labs    10/07/24 0958  PROBNP 1,995.0*   HbA1C: No results for input(s): HGBA1C in the last 72 hours. CBG: No results for input(s): GLUCAP in the last 168 hours. Lipid Profile: No results for input(s): CHOL, HDL, LDLCALC, TRIG, CHOLHDL, LDLDIRECT in the last 72 hours. Thyroid  Function Tests: No results for input(s): TSH, T4TOTAL, FREET4, T3FREE, THYROIDAB in the last 72 hours. Anemia Panel: No results for input(s): VITAMINB12, FOLATE, FERRITIN, TIBC, IRON, RETICCTPCT in the last 72  hours. Most Recent Urinalysis On File:     Component Value Date/Time   COLORURINE COLORLESS (A) 11/21/2022 1140   APPEARANCEUR CLEAR (A) 11/21/2022 1140   LABSPEC 1.005 11/21/2022 1140   PHURINE 5.0 11/21/2022 1140   GLUCOSEU NEGATIVE 11/21/2022 1140   HGBUR SMALL (A) 11/21/2022 1140   BILIRUBINUR NEGATIVE 11/21/2022 1140   KETONESUR NEGATIVE 11/21/2022 1140   PROTEINUR NEGATIVE 11/21/2022 1140   NITRITE NEGATIVE 11/21/2022 1140   LEUKOCYTESUR NEGATIVE 11/21/2022 1140   Sepsis Labs: @LABRCNTIP (procalcitonin:4,lacticidven:4)  No results found for this or any previous visit (from the past 240 hours).       Radiology Studies: DG Chest 2 View Result Date: 10/07/2024 EXAM: 2 VIEW(S) XRAY OF THE CHEST 10/07/2024 10:08:00 AM COMPARISON: 04/04/2024 CLINICAL HISTORY: sob FINDINGS: LUNGS AND PLEURA: No focal pulmonary opacity. No pleural effusion. No pneumothorax. HEART AND MEDIASTINUM: Cardiomegaly, stable. Post TAVR. Left subclavian dual lead cardiac rhythm device noted. BONES AND SOFT TISSUES: Chronic T12 fracture. Exaggerated thoracic kyphosis. IMPRESSION: 1. No acute cardiopulmonary process identified. 2. Cardiomegaly, stable. Post TAVR with left subclavian dual-lead cardiac rhythm device in place. 3. 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 Electronically signed by: Ryan Chess MD 10/07/2024 11:25 AM EST RP Workstation: [id number]             LOS: 0 days      Laneta Blunt, DO [facility] 10/07/2024, 11:31 AM    Dictation software may have been used to generate the above note. Typos may occur and escape review in typed/dictated notes. Please contact Dr Blunt directly for clarity if needed.  Staff may message me via secure chat in Epic  but this may not receive an immediate response,  please page me for urgent matters!  If 7PM-7AM, please contact night coverage [email or web address]

## 2024-10-07 NOTE — ED Notes (Signed)
 Patient transported to X-ray

## 2024-10-07 NOTE — Inpatient Diabetes Management (Addendum)
 Inpatient Diabetes Program Recommendations  AACE/ADA: New Consensus Statement on Inpatient Glycemic Control (2015)  Target Ranges:  Prepandial:   less than 140 mg/dL      Peak postprandial:   less than 180 mg/dL (1-2 hours)      Critically ill patients:  140 - 180 mg/dL    Latest Reference Range & Units 10/07/24 09:58  Glucose 70 - 99 mg/dL 824 (H)  (H): Data is abnormally high     Admit with: Acute on chronic diastolic congestive heart failure   History: DM1 (LADA)  Home DM Meds: OmniPod Dash Insulin  Pump w/ Novolog        Use Lantus  41 units daily in the event of pump failure      Freestyle Libre CGM  Current Orders: Insulin  Pump    Met w/ pt at bedside in the ED.  Pt A&O and able to manage insulin  pump independently.  Verified insulin  pump settings with pt (see note below for pump settings).  Pt uses the Omni Pod Dash--Has the Controller at bedside--Currently has FSL glucose sensor on R upper arm.  She changed her pod this AM (pod holds 150 units).  Changes the pod every 3 days.  Family can bring more supplies to hospital when needed.  Pt checked CGM reading around 2:20pm--It was 116 and she ate Late Lunch--Gave self 3.75 units while I was in the room.  Told me she was supposed to see her ENDO today to upgrade to the OmniPod 5 insulin  pump.  Discussed with pt that the bedside RN will still check her fingerstick CBGs as ordered--pt OK with this.  Asked pt to please report all insulin  boluses given to herself with the pump to the RN.  Pt agreeable.  Reviewed the above info with the ED RN as well.    ENDO: Dr. Theadora with [facility] Last Seen 05/25/2024 Freeman Palin with Herlene sensor  Novolog  insulin   Basal (units/hr) 12A: 1  5A: 1.7 12P: 1.6 6P: 1.4 Total: 34.9 units/day  ICR 12A: 1 unit/9g carbs 630A: 1 unit/4g carbs 5P: 1 unit/4g carbs 9P: 1 unit/9g carbs   ISF: 35 Target 150 IOB: 4 hours    --Will follow patient during hospitalization--  Adina Rudolpho Arrow  RN, MSN, CDCES Diabetes Coordinator Inpatient Glycemic Control Team Team Pager: [phone] (8a-5p)

## 2024-10-07 NOTE — Consult Note (Signed)
 Lindsay Glenn is a 78 y.o. 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  She has a history of stage CKD 3B with creatinine this afternoon 2.31.  She has chronic hypoxia with restrictive lung disease and is on oxygen.   Review of Systems: No chest pain   Past Medical History:  Diagnosis Date   (HFpEF) heart failure with preserved ejection fraction (HCC)    a.) TTE 02/07/2020: EF 55%. b.) TTE 08/26/2021: EF >55%; mild BAE   Aortic stenosis    a.) TTE 02/07/2020: EF 55%; trivial MR; mild AS with MPG 8 mmHg. b.) TTE 08/26/2021: EF >55%; mild MR/TR/PR; mild BAE; mild AS with MPG 21 mmHg.   Arthritis    BILATERAL rotator cuff tendinitis    CHF (congestive heart failure) (HCC)    CKD (chronic kidney disease), stage III (HCC)    Complication of anesthesia    a.) (+) aspiration even during colonoscopy   CTS (carpal tunnel syndrome)    Depression    DOE (dyspnea on exertion)    Dyspnea    Gastroparesis    GERD (gastroesophageal reflux disease)    Hashimoto's thyroiditis    Heart murmur    History of hiatal hernia    HLD (hyperlipidemia)    Hypertension    IDA (iron deficiency anemia)    Insomnia    LADA (latent autoimmune diabetes in adults), managed as type 1 (HCC)    a.) uses insulin  pump   Obesity    Osteopenia    Osteoporosis    Pancreatic cyst    Pneumonia    PONV (postoperative nausea and vomiting)    Pseudophakia of both eyes    Renal cyst, right    Restless leg syndrome    Sleep apnea    a.) unable to tolerate nocturnal PAP therapy   Stroke (HCC) 2017   field of vision lower on right eye   TIA (transient ischemic attack) 2017    (Not in a hospital admission)      [START ON 10/08/2024] ALPRAZolam   0.5 mg Oral Daily   [START ON 10/08/2024] aspirin  EC  81 mg Oral Daily   bumetanide  (BUMEX ) IV  1 mg Intravenous Q12H   carvedilol   6.25 mg Oral BID   [START ON 10/08/2024] clopidogrel   75 mg Oral Daily   [START ON 10/08/2024] ezetimibe   10 mg Oral Daily   heparin   5,000 Units Subcutaneous Q8H   insulin  pump   Subcutaneous TID WC, HS, 0200   [START ON 10/08/2024] pantoprazole   40 mg Oral Daily   pravastatin   80 mg Oral QHS   pregabalin   50 mg Oral BID   rOPINIRole   2 mg Oral QHS   sodium chloride  flush  3 mL Intravenous Q12H    Infusions:  sodium chloride       Allergies  Allergen Reactions   Empagliflozin Nausea And Vomiting and Other (See Comments)    Decreased energy (Jardiance)   Silicone Other (See Comments)    Causes rash, blistering( if on for several days)   Paper tape and tegaderm  is OK briefly   Tape Other (See Comments)    Causes rash, blistering( if on for several days)  Paper tape and tegaderm  is OK   Gabapentin Other (See Comments)   Atorvastatin Other (See Comments)    Increases blood sugar   Furosemide Itching and Rash   Lisinopril Cough   Rosuvastatin  Other (See Comments)    Increases blood sugar   Trulicity [Dulaglutide] Diarrhea and Nausea And Vomiting    Social History   Socioeconomic History   Marital status: Married    Spouse name: [name]   Number of children: Not on file   Years of education: Not on file   Highest education level: Not on file  Occupational History   Not on file  Tobacco Use   Smoking status: Never   Smokeless tobacco: Never  Vaping Use   Vaping status: Never Used  Substance and Sexual Activity   Alcohol use: Yes    Comment: rarely   Drug use: Never   Sexual activity: Not on file  Other Topics Concern   Not on file  Social History Narrative   Not on file   Social Drivers of Health   Financial Resource Strain: Low Risk  (07/29/2024)   Received from [facility]    Overall Financial Resource Strain (CARDIA)    Difficulty of Paying Living Expenses: Not hard at all  Food Insecurity: No Food Insecurity (07/29/2024)   Received from [facility]   Hunger Vital Sign    Within the past 12 months, you worried that your food would run out before you got the money to buy more.: Never true    Within the past 12 months, the food you bought just didn't last and you didn't have money to get more.: Never true  Transportation Needs: No Transportation Needs (06/14/2024)   Received from [facility] - Transportation    In the past 12 months, has lack of transportation kept you from medical appointments or from getting medications?: No    Lack of Transportation (Non-Medical): No  Physical Activity: Inactive (07/29/2024)   Received from [facility]   Exercise Vital Sign    On average, how many days per week do you engage in moderate to strenuous exercise (like a brisk walk)?: 0 days    On average, how many minutes do you engage in exercise at this level?: 0 min  Stress: Stress Concern Present (07/29/2024)   Received from [facility] - Occupational Stress Questionnaire    Feeling of Stress : To some extent  Social Connections: Moderately Integrated (07/29/2024)   Received from [facility]   Social Connection and Isolation Panel    In a typical week, how many times do you talk on the phone with family, friends, or neighbors?: More than three times a week    How often do you get together with friends or relatives?: Twice a week    How often do you attend church or religious services?: More than 4 times per year    Do you belong to any clubs or organizations such as church groups, unions, fraternal or athletic groups, or school groups?: Yes    How often do you attend meetings of the clubs or organizations you belong to?: More than 4 times  per year    Are you married, widowed, divorced, separated, never married, or living with a partner?: Widowed  Intimate Partner Violence: Not At Risk (09/14/2023)   Humiliation, Afraid, Rape, and Kick questionnaire  Fear of Current or Ex-Partner: No    Emotionally Abused: No    Physically Abused: No    Sexually Abused: No    Family History  Problem Relation Age of Onset   Alzheimer's disease Mother    Cancer Father     PHYSICAL EXAM: Vitals:   10/07/24 1336 10/07/24 1452  BP:  121/82  Pulse:  63  Resp:  19  Temp: 97.6 F (36.4 C) 98.1 F (36.7 C)  SpO2:  100%     Intake/Output Summary (Last 24 hours) at 10/07/2024 1657 Last data filed at 10/07/2024 1526 Gross per 24 hour  Intake 118 ml  Output --  Net 118 ml    General:  Well appearing. No respiratory difficulty HEENT: normal Neck: supple. no JVD. Carotids 2+ bilat; no bruits. No lymphadenopathy or thryomegaly appreciated. Cor: PMI nondisplaced. Regular rate & rhythm. No rubs, gallops or murmurs. Lungs: clear Abdomen: soft, nontender, nondistended. No hepatosplenomegaly. No bruits or masses. Good bowel sounds. Extremities: no cyanosis, clubbing, rash, edema Neuro: alert & oriented x 3, cranial nerves grossly intact. moves all 4 extremities w/o difficulty. Affect pleasant.  ECG: AV sequential paced rhythm at 64 bpm  Results for orders placed or performed during the hospital encounter of 10/07/24 (from the past 24 hours)  CBC     Status: Abnormal   Collection Time: 10/07/24  9:48 AM  Result Value Ref Range   WBC 7.8 4.0 - 10.5 K/uL   RBC 3.07 (L) 3.87 - 5.11 MIL/uL   Hemoglobin 9.6 (L) 12.0 - 15.0 g/dL   HCT 69.9 (L) 63.9 - 53.9 %   MCV 97.7 80.0 - 100.0 fL   MCH 31.3 26.0 - 34.0 pg   MCHC 32.0 30.0 - 36.0 g/dL   RDW 85.5 88.4 - 84.4 %   Platelets 143 (L) 150 - 400 K/uL   nRBC 0.0 0.0 - 0.2 %  Troponin T, High Sensitivity     Status: Abnormal   Collection Time: 10/07/24  9:48 AM  Result Value Ref Range    Troponin T High Sensitivity 59 (H) 0 - 19 ng/L  Protime-INR (order if Patient is taking Coumadin / Warfarin)     Status: None   Collection Time: 10/07/24  9:48 AM  Result Value Ref Range   Prothrombin Time 14.5 11.4 - 15.2 seconds   INR 1.1 0.8 - 1.2  Comprehensive metabolic panel     Status: Abnormal   Collection Time: 10/07/24  9:58 AM  Result Value Ref Range   Sodium 141 135 - 145 mmol/L   Potassium 4.1 3.5 - 5.1 mmol/L   Chloride 101 98 - 111 mmol/L   CO2 25 22 - 32 mmol/L   Glucose, Bld 175 (H) 70 - 99 mg/dL   BUN 96 (H) 8 - 23 mg/dL   Creatinine, Ser 7.51 (H) 0.44 - 1.00 mg/dL   Calcium  8.3 (L) 8.9 - 10.3 mg/dL   Total Protein 6.7 6.5 - 8.1 g/dL   Albumin 4.1 3.5 - 5.0 g/dL   AST 29 15 - 41 U/L   ALT 31 0 - 44 U/L   Alkaline Phosphatase 143 (H) 38 - 126 U/L   Total Bilirubin 0.3 0.0 - 1.2 mg/dL   GFR, Estimated 19 (L) >60 mL/min   Anion gap 15 5 - 15  Pro Brain natriuretic peptide     Status: Abnormal   Collection Time: 10/07/24  9:58 AM  Result Value Ref Range   Pro Brain Natriuretic Peptide 1,995.0 (  H) <300.0 pg/mL  Troponin T, High Sensitivity     Status: Abnormal   Collection Time: 10/07/24 12:51 PM  Result Value Ref Range   Troponin T High Sensitivity 53 (H) 0 - 19 ng/L  CBC     Status: Abnormal   Collection Time: 10/07/24 12:51 PM  Result Value Ref Range   WBC 6.1 4.0 - 10.5 K/uL   RBC 2.81 (L) 3.87 - 5.11 MIL/uL   Hemoglobin 9.0 (L) 12.0 - 15.0 g/dL   HCT 72.5 (L) 63.9 - 53.9 %   MCV 97.5 80.0 - 100.0 fL   MCH 32.0 26.0 - 34.0 pg   MCHC 32.8 30.0 - 36.0 g/dL   RDW 85.6 88.4 - 84.4 %   Platelets 140 (L) 150 - 400 K/uL   nRBC 0.0 0.0 - 0.2 %  Creatinine, serum     Status: Abnormal   Collection Time: 10/07/24 12:51 PM  Result Value Ref Range   Creatinine, Ser 2.37 (H) 0.44 - 1.00 mg/dL   GFR, Estimated 21 (L) >60 mL/min  Urinalysis, Routine w reflex microscopic -Urine, Clean Catch     Status: Abnormal   Collection Time: 10/07/24 12:51 PM  Result Value  Ref Range   Color, Urine STRAW (A) YELLOW   APPearance HAZY (A) CLEAR   Specific Gravity, Urine 1.008 1.005 - 1.030   pH 5.0 5.0 - 8.0   Glucose, UA NEGATIVE NEGATIVE mg/dL   Hgb urine dipstick NEGATIVE NEGATIVE   Bilirubin Urine NEGATIVE NEGATIVE   Ketones, ur NEGATIVE NEGATIVE mg/dL   Protein, ur NEGATIVE NEGATIVE mg/dL   Nitrite NEGATIVE NEGATIVE   Leukocytes,Ua SMALL (A) NEGATIVE   RBC / HPF 0-5 0 - 5 RBC/hpf   WBC, UA 6-10 0 - 5 WBC/hpf   Bacteria, UA RARE (A) NONE SEEN   Squamous Epithelial / HPF 6-10 0 - 5 /HPF  Creatinine, urine, random     Status: None   Collection Time: 10/07/24 12:51 PM  Result Value Ref Range   Creatinine, Urine 29 mg/dL  CBG monitoring, ED     Status: Abnormal   Collection Time: 10/07/24  4:38 PM  Result Value Ref Range   Glucose-Capillary 104 (H) 70 - 99 mg/dL   DG Chest 2 View Result Date: 10/07/2024 EXAM: 2 VIEW(S) XRAY OF THE CHEST 10/07/2024 10:08:00 AM COMPARISON: 04/04/2024 CLINICAL HISTORY: sob FINDINGS: LUNGS AND PLEURA: No focal pulmonary opacity. No pleural effusion. No pneumothorax. HEART AND MEDIASTINUM: Cardiomegaly, stable. Post TAVR. Left subclavian dual lead cardiac rhythm device noted. BONES AND SOFT TISSUES: Chronic T12 fracture. Exaggerated thoracic kyphosis. IMPRESSION: 1. No acute cardiopulmonary process identified. 2. Cardiomegaly, stable. Post TAVR with left subclavian dual-lead cardiac rhythm device in place. 3. 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 Electronically signed by: Ryan Chess MD 10/07/2024 11:25 AM EST RP Workstation: [id number]     ASSESSMENT AND PLAN: Decompensated heart failure with prior history of HFpEF and status post TAVR.  Patient was started on Bumex  and Lasix.  Creatinine is 2.31.  May consider Lasix drip while she is in the hospital.  Will get a echocardiogram to make further recommendation.  Malcomb Gangemi Fernand

## 2024-10-07 NOTE — ED Provider Notes (Signed)
 [facility] Provider Note   Event Date/Time   First MD Initiated Contact with Patient 10/07/24 [phone]     (approximate) History  Shortness of Breath  HPI Lindsay Glenn is a 78 y.o. 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  Patient also endorses worsening lower extremity edema.  Patient states that she was seen by her cardiologist recently and increased her Bumex  however patient states that her edema has not improved and her shortness of breath has worsened. ROS: Patient currently denies any vision changes, tinnitus, difficulty speaking, facial droop, sore throat, abdominal pain, nausea/vomiting/diarrhea, dysuria, or weakness/numbness/paresthesias in any extremity   Physical Exam  Triage Vital Signs: ED Triage Vitals  Encounter Vitals Group     BP 10/07/24 0942 (!) 143/65     Girls Systolic BP Percentile --      Girls Diastolic BP Percentile --      Boys Systolic BP Percentile --      Boys Diastolic BP Percentile --      Pulse Rate 10/07/24 0942 63     Resp 10/07/24 0942 16     Temp 10/07/24 0942 98.4 F (36.9 C)     Temp src --      SpO2 10/07/24 0942 98 %     Weight 10/07/24 0946 199 lb 4.7 oz (90.4 kg)     Height 10/07/24 0946 5' (1.524 m)     Head Circumference --      Peak Flow --      Pain Score 10/07/24 0946 10     Pain Loc --      Pain Education --      Exclude from Growth Chart --    Most recent vital signs: Vitals:   10/09/24 0446 10/09/24 0713  BP: (!) 99/39 (!) 113/46  Pulse: (!) 59 60  Resp: 20   Temp: 98.3 F (36.8 C) 98.1 F (36.7 C)  SpO2: 99% 99%   General: Awake, oriented x4. CV:  Good  peripheral perfusion. Resp:  Normal effort.  Rales over bilateral lower lung fields Abd:  No distention. Other:  Elderly overweight Caucasian female resting comfortably in no acute distress.  2+ pitting edema to bilateral lower extremities ED Results / Procedures / Treatments  Labs (all labs ordered are listed, but only abnormal results are displayed) Labs Reviewed  CBC - Abnormal; Notable for the following components:      Result Value   RBC 3.07 (*)    Hemoglobin 9.6 (*)    HCT 30.0 (*)    Platelets 143 (*)    All other components within normal limits  COMPREHENSIVE METABOLIC PANEL WITH GFR - Abnormal; Notable for the following components:   Glucose, Bld 175 (*)    BUN 96 (*)    Creatinine, Ser 2.48 (*)    Calcium  8.3 (*)    Alkaline Phosphatase 143 (*)    GFR, Estimated 19 (*)    All other components within normal limits  PRO BRAIN NATRIURETIC PEPTIDE - Abnormal; Notable for the following components:   Pro Brain Natriuretic Peptide 1,995.0 (*)    All other components within normal limits  CBC - Abnormal; Notable for the following  components:   RBC 2.81 (*)    Hemoglobin 9.0 (*)    HCT 27.4 (*)    Platelets 140 (*)    All other components within normal limits  CREATININE, SERUM - Abnormal; Notable for the following components:   Creatinine, Ser 2.37 (*)    GFR, Estimated 21 (*)    All other components within normal limits  URINALYSIS, ROUTINE W REFLEX MICROSCOPIC - Abnormal; Notable for the following components:   Color, Urine STRAW (*)    APPearance HAZY (*)    Leukocytes,Ua SMALL (*)    Bacteria, UA RARE (*)    All other components within normal limits  BASIC METABOLIC PANEL WITH GFR - Abnormal; Notable for the following components:   BUN 110 (*)    Creatinine, Ser 2.87 (*)    Calcium  7.9 (*)    GFR, Estimated 16 (*)    All other components within normal limits  GLUCOSE, CAPILLARY - Abnormal; Notable for the following components:   Glucose-Capillary 106 (*)     All other components within normal limits  GLUCOSE, CAPILLARY - Abnormal; Notable for the following components:   Glucose-Capillary 100 (*)    All other components within normal limits  GLUCOSE, CAPILLARY - Abnormal; Notable for the following components:   Glucose-Capillary 223 (*)    All other components within normal limits  BASIC METABOLIC PANEL WITH GFR - Abnormal; Notable for the following components:   Glucose, Bld 126 (*)    BUN 118 (*)    Creatinine, Ser 3.07 (*)    Calcium  7.6 (*)    GFR, Estimated 15 (*)    All other components within normal limits  GLUCOSE, CAPILLARY - Abnormal; Notable for the following components:   Glucose-Capillary 218 (*)    All other components within normal limits  GLUCOSE, CAPILLARY - Abnormal; Notable for the following components:   Glucose-Capillary 157 (*)    All other components within normal limits  CBG MONITORING, ED - Abnormal; Notable for the following components:   Glucose-Capillary 104 (*)    All other components within normal limits  TROPONIN T, HIGH SENSITIVITY - Abnormal; Notable for the following components:   Troponin T High Sensitivity 59 (*)    All other components within normal limits  TROPONIN T, HIGH SENSITIVITY - Abnormal; Notable for the following components:   Troponin T High Sensitivity 53 (*)    All other components within normal limits  PROTIME-INR  CREATININE, URINE, RANDOM  UREA  NITROGEN, URINE  GLUCOSE, CAPILLARY  GLUCOSE, CAPILLARY  GLUCOSE, CAPILLARY  CBC WITH DIFFERENTIAL/PLATELET  CBC WITH DIFFERENTIAL/PLATELET   EKG ED ECG REPORT I, Artist MARLA Kerns, the attending physician, personally viewed and interpreted this ECG. Date: 10/07/2024 EKG Time: 0949 Rate: 64 Rhythm: normal sinus rhythm QRS Axis: normal Intervals: normal ST/T Wave abnormalities: normal Narrative Interpretation: no evidence of acute ischemia RADIOLOGY ED MD interpretation: 2 view chest x-ray shows no evidence of acute abnormalities -  All radiology independently interpreted and agree with radiology assessment Official radiology report(s): ECHOCARDIOGRAM COMPLETE Result Date: 10/08/2024    ECHOCARDIOGRAM REPORT   Patient Name:   Lindsay Glenn [facility] Date of Exam: 10/08/2024 Medical Rec #:  [id number]    Height:       60.0 in Accession #:    [id number]   Weight:       199.3 lb Date of Birth:  [birth year]   BSA:          1.864 m Patient Age:    66  years     BP:           104/44 mmHg Patient Gender: F            HR:           62 bpm. Exam Location:  [facility] Procedure: 2D Echo, Cardiac Doppler, Color Doppler and Intracardiac            Opacification Agent (Both Spectral and Color Flow Doppler were            utilized during procedure). Indications:     CHF I50.9  History:         Patient has prior history of Echocardiogram examinations, most                  recent 11/22/2022.                  Aortic Valve: valve is present in the aortic position.                  Procedure Date: 6/25.  Sonographer:     Thedora Louder RDCS, FASE Referring Phys:  [id number] LANETA BLUNT Diagnosing Phys: Denyse Bathe  Sonographer Comments: Technically challenging study due to limited acoustic windows, patient is obese, no apical window and no subcostal window. Image acquisition challenging due to patient body habitus and Image acquisition challenging due to respiratory motion. IMPRESSIONS  1. Left ventricular ejection fraction, by estimation, is 55 to 60%. The left ventricle has normal function. The left ventricle has no regional wall motion abnormalities. There is severe concentric left ventricular hypertrophy. Left ventricular diastolic  parameters are consistent with Grade I diastolic dysfunction (impaired relaxation).  2. Right ventricular systolic function is normal. The right ventricular size is normal.  3. The mitral valve is normal in structure. No evidence of mitral valve regurgitation. No evidence of mitral stenosis.  4. The aortic valve has been repaired/replaced.  Aortic valve regurgitation is not visualized. No aortic stenosis is present. There is a valve present in the aortic position. Procedure Date: 6/25.  5. The inferior vena cava is normal in size with greater than 50% respiratory variability, suggesting right atrial pressure of 3 mmHg. FINDINGS  Left Ventricle: Left ventricular ejection fraction, by estimation, is 55 to 60%. The left ventricle has normal function. The left ventricle has no regional wall motion abnormalities. Definity  contrast agent was given IV to delineate the left ventricular  endocardial borders. Strain was performed and the global longitudinal strain is indeterminate. The left ventricular internal cavity size was normal in size. There is severe concentric left ventricular hypertrophy. Left ventricular diastolic parameters are consistent with Grade I diastolic dysfunction (impaired relaxation). Right Ventricle: The right ventricular size is normal. No increase in right ventricular wall thickness. Right ventricular systolic function is normal. Left Atrium: Left atrial size was normal in size. Right Atrium: Right atrial size was normal in size. Pericardium: There is no evidence of pericardial effusion. Mitral Valve: The mitral valve is normal in structure. No evidence of mitral valve regurgitation. No evidence of mitral valve stenosis. Tricuspid Valve: The tricuspid valve is normal in structure. Tricuspid valve regurgitation is not demonstrated. No evidence of tricuspid stenosis. Aortic Valve: Had T[facility]R at [facility], and no siginificant gradiant and no AR. The aortic valve has been repaired/replaced. Aortic valve regurgitation is not visualized. No aortic stenosis is present. Aortic valve mean gradient measures 9.0 mmHg. Aortic valve peak gradient measures 18.6 mmHg. There is a valve present in the aortic  position. Procedure Date: 6/25. Pulmonic Valve: The pulmonic valve was normal in structure. Pulmonic valve regurgitation is not visualized. No evidence of  pulmonic stenosis. Aorta: The aortic root is normal in size and structure. Venous: The inferior vena cava is normal in size with greater than 50% respiratory variability, suggesting right atrial pressure of 3 mmHg. IAS/Shunts: No atrial level shunt detected by color flow Doppler. Additional Comments: 3D was performed not requiring image post processing on an independent workstation and was indeterminate.  LEFT VENTRICLE PLAX 2D LVIDd:         3.60 cm LVIDs:         2.40 cm LV PW:         1.40 cm LV IVS:        1.10 cm LVOT diam:     1.80 cm LVOT Area:     2.54 cm  LEFT ATRIUM         Index LA diam:    5.10 cm 2.74 cm/m  AORTIC VALVE               PULMONIC VALVE AV Vmax:      215.50 cm/s  RVOT Peak grad: 3 mmHg AV Vmean:     134.500 cm/s AV VTI:       0.468 m AV Peak Grad: 18.6 mmHg AV Mean Grad: 9.0 mmHg  AORTA Ao Root diam: 2.50 cm TRICUSPID VALVE TR Peak grad:   30.9 mmHg TR Vmax:        278.00 cm/s  SHUNTS Systemic Diam: 1.80 cm Denyse Bathe Electronically signed by Denyse Bathe Signature Date/Time: 10/08/2024/2:38:54 PM    Final    PROCEDURES: Critical Care performed: Yes, see critical care procedure note(s) Procedures CRITICAL CARE Performed by: Shaunette Gassner K Serena Petterson  Total critical care time: 35 minutes  Critical care time was exclusive of separately billable procedures and treating other patients.  Critical care was necessary to treat or prevent imminent or life-threatening deterioration.  Critical care was time spent personally by me on the following activities: development of treatment plan with patient and/or surrogate as well as nursing, discussions with consultants, evaluation of patient's response to treatment, examination of patient, obtaining history from patient or surrogate, ordering and performing treatments and interventions, ordering and review of laboratory studies, ordering and review of radiographic studies, pulse oximetry and re-evaluation of patient's condition.  MEDICATIONS  ORDERED IN ED: Medications  sodium chloride  flush (NS) 0.9 % injection 3 mL (3 mLs Intravenous Given 10/09/24 0822)  sodium chloride  flush (NS) 0.9 % injection 3 mL (has no administration in time range)  0.9 %  sodium chloride  infusion (has no administration in time range)  acetaminophen  (TYLENOL ) tablet 650 mg (650 mg Oral Given 10/09/24 0157)  ondansetron  (ZOFRAN ) injection 4 mg (has no administration in time range)  heparin  injection 5,000 Units (5,000 Units Subcutaneous Given 10/09/24 0600)  bumetanide  (BUMEX ) injection 1 mg (1 mg Intravenous Not Given 10/09/24 0800)  diphenhydrAMINE  (BENADRYL ) capsule 25 mg (has no administration in time range)  aspirin  EC tablet 81 mg (81 mg Oral Given 10/09/24 0821)  carvedilol  (COREG ) tablet 6.25 mg (6.25 mg Oral Not Given 10/09/24 0801)  clopidogrel  (PLAVIX ) tablet 75 mg (75 mg Oral Given 10/09/24 0821)  ezetimibe  (ZETIA ) tablet 10 mg (10 mg Oral Given 10/09/24 0821)  oxyCODONE  (Oxy IR/ROXICODONE ) immediate release tablet 5 mg (has no administration in time range)  oxyCODONE  (Oxy IR/ROXICODONE ) immediate release tablet 10 mg (10 mg Oral Given 10/07/24 2130)  pantoprazole  (PROTONIX ) EC tablet  40 mg (40 mg Oral Given 10/09/24 0820)  polyethylene glycol (MIRALAX  / GLYCOLAX ) packet 17 g (has no administration in time range)  pravastatin  (PRAVACHOL ) tablet 80 mg (80 mg Oral Given 10/08/24 2051)  pregabalin  (LYRICA ) capsule 50 mg (50 mg Oral Not Given 10/09/24 0801)  rOPINIRole  (REQUIP ) tablet 2 mg (2 mg Oral Given 10/08/24 2051)  senna-docusate (Senokot-S) tablet 2 tablet (has no administration in time range)  insulin  pump ( Subcutaneous Not Given 10/09/24 0206)  ALPRAZolam  (XANAX ) tablet 0.5 mg (has no administration in time range)  methocarbamol  (ROBAXIN ) injection 500 mg (has no administration in time range)  bumetanide  (BUMEX ) injection 1 mg (1 mg Intravenous Given 10/07/24 1133)  oxyCODONE  (Oxy IR/ROXICODONE ) immediate release tablet 10 mg (10 mg  Oral Given 10/07/24 1418)   IMPRESSION / MDM / ASSESSMENT AND PLAN / ED COURSE  I reviewed the triage vital signs and the nursing notes.                             The patient is on the cardiac monitor to evaluate for evidence of arrhythmia and/or significant heart rate changes. Patient's presentation is most consistent with acute presentation with potential threat to life or bodily function. Patient is a 78 year old female with the above-stated past medical history that presents for worsening shortness of breath over the last week DDx: CHF exacerbation, pulmonary edema, ACS, pneumonia Plan: CBC, CMP, UA, proBNP, troponin, chest x-ray, EKG  Given laboratory and radiologic workup showing likely persistent CHF exacerbation with worsening oxygen requirement, patient will require admission in the internal medicine service for further evaluation and management.  Patient agrees with plan.  All questions answered  Dispo: Admit to medicine   FINAL CLINICAL IMPRESSION(S) / ED DIAGNOSES   Final diagnoses:  Acute and chronic respiratory failure with hypoxia (HCC)  Congestive heart failure, unspecified HF chronicity, unspecified heart failure type (HCC)   Rx / DC Orders   ED Discharge Orders     None      Note:  This document was prepared using Dragon voice recognition software and may include unintentional dictation errors.   Kalya Troeger K, MD 10/09/24 [phone]

## 2024-10-08 ENCOUNTER — Inpatient Hospital Stay
Admit: 2024-10-08 | Discharge: 2024-10-08 | Disposition: A | Discharge status: Home / Self Care | Attending: Osteopathic Medicine | Admitting: Osteopathic Medicine

## 2024-10-08 DIAGNOSIS — I5033 Acute on chronic diastolic (congestive) heart failure: Secondary | ICD-10-CM | POA: Diagnosis not present

## 2024-10-08 DIAGNOSIS — I2510 Atherosclerotic heart disease of native coronary artery without angina pectoris: Secondary | ICD-10-CM | POA: Diagnosis not present

## 2024-10-08 DIAGNOSIS — I509 Heart failure, unspecified: Secondary | ICD-10-CM | POA: Diagnosis not present

## 2024-10-08 LAB — ECHOCARDIOGRAM COMPLETE
AV Mean grad: 9 mmHg
AV Peak grad: 18.6 mmHg
Ao pk vel: 2.16 m/s
Height: 60 in
S' Lateral: 2.4 cm
Weight: 3188.73 [oz_av]

## 2024-10-08 LAB — BASIC METABOLIC PANEL WITH GFR
Anion gap: 13 (ref 5–15)
BUN: 110 mg/dL — ABNORMAL HIGH (ref 8–23)
CO2: 25 mmol/L (ref 22–32)
Calcium: 7.9 mg/dL — ABNORMAL LOW (ref 8.9–10.3)
Chloride: 102 mmol/L (ref 98–111)
Creatinine, Ser: 2.87 mg/dL — ABNORMAL HIGH (ref 0.44–1.00)
GFR, Estimated: 16 mL/min — ABNORMAL LOW (ref >60)
Glucose, Bld: 76 mg/dL (ref 70–99)
Potassium: 4.3 mmol/L (ref 3.5–5.1)
Sodium: 140 mmol/L (ref 135–145)

## 2024-10-08 LAB — UREA NITROGEN, URINE: Urea Nitrogen, Ur: 311 mg/dL

## 2024-10-08 LAB — GLUCOSE, CAPILLARY
Glucose-Capillary: 81 mg/dL (ref 70–99)
Glucose-Capillary: 106 mg/dL — ABNORMAL HIGH (ref 70–99)
Glucose-Capillary: 100 mg/dL — ABNORMAL HIGH (ref 70–99)
Glucose-Capillary: 223 mg/dL — ABNORMAL HIGH (ref 70–99)
Glucose-Capillary: 218 mg/dL — ABNORMAL HIGH (ref 70–99)

## 2024-10-08 MED ORDER — PERFLUTREN LIPID MICROSPHERE
5.0000 mL | INTRAVENOUS | Status: AC | PRN
  Administered 2024-10-08: 5 mL via INTRAVENOUS

## 2024-10-08 MED ORDER — ALPRAZOLAM 0.5 MG PO TABS: 0.5000 mg | ORAL_TABLET | Freq: Every day | ORAL | Status: DC | PRN

## 2024-10-08 NOTE — Progress Notes (Addendum)
 SUBJECTIVE: Patient is less short of breath compared to when she first came yesterday.  She says IV Lasix and Bumex  help.   Vitals:   10/08/24 0413 10/08/24 0527 10/08/24 0731 10/08/24 1208  BP:   (!) 104/44 108/69  Pulse:   (!) 59 (!) 58  Resp: 18  14 16   Temp:   98 F (36.7 C) 98.9 F (37.2 C)  TempSrc:   Oral   SpO2: 94%  99% 100%  Weight:  90.4 kg    Height:        Intake/Output Summary (Last 24 hours) at 10/08/2024 1359 Last data filed at 10/08/2024 0900 Gross per 24 hour  Intake 598 ml  Output --  Net 598 ml    LABS: Basic Metabolic Panel: Recent Labs    10/07/24 0958 10/07/24 1251 10/08/24 0547  NA 141  --  140  K 4.1  --  4.3  CL 101  --  102  CO2 25  --  25  GLUCOSE 175*  --  76  BUN 96*  --  110*  CREATININE 2.48* 2.37* 2.87*  CALCIUM  8.3*  --  7.9*   Liver Function Tests: Recent Labs    10/07/24 0958  AST 29  ALT 31  ALKPHOS 143*  BILITOT 0.3  PROT 6.7  ALBUMIN 4.1   No results for input(s): LIPASE, AMYLASE in the last 72 hours. CBC: Recent Labs    10/07/24 0948 10/07/24 1251  WBC 7.8 6.1  HGB 9.6* 9.0*  HCT 30.0* 27.4*  MCV 97.7 97.5  PLT 143* 140*   Cardiac Enzymes: No results for input(s): CKTOTAL, CKMB, CKMBINDEX, TROPONINI in the last 72 hours. BNP: Invalid input(s): POCBNP D-Dimer: No results for input(s): DDIMER in the last 72 hours. Hemoglobin A1C: No results for input(s): HGBA1C in the last 72 hours. Fasting Lipid Panel: No results for input(s): CHOL, HDL, LDLCALC, TRIG, CHOLHDL, LDLDIRECT in the last 72 hours. Thyroid  Function Tests: No results for input(s): TSH, T4TOTAL, T3FREE, THYROIDAB in the last 72 hours.  Invalid input(s): FREET3 Anemia Panel: No results for input(s): VITAMINB12, FOLATE, FERRITIN, TIBC, IRON, RETICCTPCT in the last 72 hours.   PHYSICAL EXAM General: Well developed, well nourished, in no acute distress HEENT:  Normocephalic and  atramatic Neck:  No JVD.  Lungs: Clear bilaterally to auscultation and percussion. Heart: HRRR . Normal S1 and S2 without gallops or murmurs.  Abdomen: Bowel sounds are positive, abdomen soft and non-tender  Msk:  Back normal, normal gait. Normal strength and tone for age. Extremities: No clubbing, cyanosis or edema.   Neuro: Alert and oriented X 3. Psych:  Good affect, responds appropriately  TELEMETRY: AV sequential paced rhythm  ASSESSMENT AND PLAN: Decompensated HFpEF with history of recent TAVR for severe aortic stenosis.  Patient has over 2. 4 8 creatinine and has limitation to treatment for heart failure.  However Bumex  has improved the symptoms.  Will look at echocardiogram and make further recommendation. No diagnosis found.  Principal Problem:   CHF (congestive heart failure) (HCC)    Denyse Bathe, MD, FACC 10/08/2024 1:59 PM

## 2024-10-08 NOTE — Progress Notes (Signed)
  Echocardiogram 2D Echocardiogram has been performed. Definity  IV ultrasound imaging agent used on this study.  [name] 10/08/2024, 11:01 AM

## 2024-10-08 NOTE — Plan of Care (Signed)

## 2024-10-08 NOTE — Progress Notes (Signed)
 Woke pt up to check O2. Sat 81% on RA. but quickly rose to 89% with increased alertness and conversation. Pt placed on 2L [place] at this time and O2 sat of 94% was achieved. Oriented x4.

## 2024-10-09 DIAGNOSIS — I5033 Acute on chronic diastolic (congestive) heart failure: Secondary | ICD-10-CM | POA: Diagnosis not present

## 2024-10-09 LAB — CBC WITH DIFFERENTIAL/PLATELET
Abs Immature Granulocytes: 0.02 K/uL (ref 0.00–0.07)
Basophils Absolute: 0 K/uL (ref 0.0–0.1)
Basophils Relative: 1 %
Eosinophils Absolute: 0.3 K/uL (ref 0.0–0.5)
Eosinophils Relative: 5 %
HCT: 26.5 % — ABNORMAL LOW (ref 36.0–46.0)
Hemoglobin: 8.4 g/dL — ABNORMAL LOW (ref 12.0–15.0)
Immature Granulocytes: 0 %
Lymphocytes Relative: 11 %
Lymphs Abs: 0.7 K/uL (ref 0.7–4.0)
MCH: 31 pg (ref 26.0–34.0)
MCHC: 31.7 g/dL (ref 30.0–36.0)
MCV: 97.8 fL (ref 80.0–100.0)
Monocytes Absolute: 0.5 K/uL (ref 0.1–1.0)
Monocytes Relative: 9 %
Neutro Abs: 4.6 K/uL (ref 1.7–7.7)
Neutrophils Relative %: 74 %
Platelets: 125 K/uL — ABNORMAL LOW (ref 150–400)
RBC: 2.71 MIL/uL — ABNORMAL LOW (ref 3.87–5.11)
RDW: 14.2 % (ref 11.5–15.5)
WBC: 6.2 K/uL (ref 4.0–10.5)
nRBC: 0 % (ref 0.0–0.2)

## 2024-10-09 LAB — BASIC METABOLIC PANEL WITH GFR
Anion gap: 12 (ref 5–15)
BUN: 118 mg/dL — ABNORMAL HIGH (ref 8–23)
CO2: 25 mmol/L (ref 22–32)
Calcium: 7.6 mg/dL — ABNORMAL LOW (ref 8.9–10.3)
Chloride: 100 mmol/L (ref 98–111)
Creatinine, Ser: 3.07 mg/dL — ABNORMAL HIGH (ref 0.44–1.00)
GFR, Estimated: 15 mL/min — ABNORMAL LOW (ref >60)
Glucose, Bld: 126 mg/dL — ABNORMAL HIGH (ref 70–99)
Potassium: 4.4 mmol/L (ref 3.5–5.1)
Sodium: 137 mmol/L (ref 135–145)

## 2024-10-09 LAB — GLUCOSE, CAPILLARY
Glucose-Capillary: 157 mg/dL — ABNORMAL HIGH (ref 70–99)
Glucose-Capillary: 98 mg/dL (ref 70–99)

## 2024-10-09 MED ORDER — METHOCARBAMOL 1000 MG/10ML IJ SOLN: 500.0000 mg | Freq: Three times a day (TID) | INTRAMUSCULAR | Status: DC | PRN

## 2024-10-09 NOTE — Plan of Care (Signed)
   Problem: Education: Goal: Knowledge of General Education information will improve Description Including pain rating scale, medication(s)/side effects and non-pharmacologic comfort measures Outcome: Progressing

## 2024-10-09 NOTE — Discharge Summary (Signed)
 Physician Discharge Summary   Patient: Lindsay Glenn MRN: [id number] DOB: 12/23/1945  Admit date:     10/07/2024  Discharge date: 10/09/24  Discharge Physician: Drue ONEIDA Potter   PCP: Epifanio Alm SQUIBB, MD   Recommendations at discharge:  Patient plans to follow-up with her outpatient physicians  Discharge Diagnoses: Principal Problem:   CHF (congestive heart failure) (HCC)  Resolved Problems:   * No resolved hospital problems. [facility] Course: From HPI Bret JINNY Osterberg is a 78 y.o. 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 On Plavix  + ASA, intolerant to high potency statin so is on pravastatin  Renal: CKD3b baseline Cr  Resp: restrictive lung disease, chronic hypoxic respiratory failureon 2L O2 [place] at home Misc: T1DM on pump insulin , OSA w/ hx intolerant to CPAP, Hx breast cancer, GERD, MDD, hypothyroid.  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  They could not wait for official hospital to hospital transfer to be completed.    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  HTN HLD Continue diuresis w/ Bumex  (noted allergy to Lasix w/  itching but tolerates Bumex  fine)  Continue strict I&O, fluid restriction, daily weights  GDMT as BP and renal fxn allows, continue home coreg  Continue ASA, statin, zetia , plavix   Echo obtained yesterday showed EF 55 to 60% with grade 1 diastolic dysfunction Cardiology consult - pt follows w/ [facility] outpatient Plan of care discussed with cardiologist Dr.Khan   AKI on CKD3a Likely prerenal d/t cardiorenal syndrome  Calculate FeUr to confirm - urine studies pending  Continue to monitor BMP Low threshold to consult nephrology if not improving    Rash c/w contact dermatitis At this time do not suspect drug rash but she has started methotrexate recently Symptom management benadryl  Skin check daily   Hx CVA Continue ASA, Plavix , statin    IDDM, Type 1 on insulin  pump  Continue insulin  pump Diabetes nurse consult    Nonspecific autoimmune / inflammatory arthritis see rheumatology notes Hold methotrexate for now Continue pain control       Consultants: Cardiology Procedures performed: None Disposition: Patient left AGAINST MEDICAL ADVICE Diet recommendation:   DISCHARGE MEDICATION: Allergies as of 10/09/2024       Reactions   Empagliflozin Nausea And Vomiting, Other (See Comments)   Decreased energy (Jardiance)   Silicone Other (See Comments)   Causes rash, blistering( if on for several days)   Paper tape and tegaderm  is OK briefly   Tape Other (See Comments)   Causes rash, blistering( if on for several days)   Paper tape and tegaderm  is OK   Gabapentin Other (See Comments)   Atorvastatin Other (See Comments)   Increases blood sugar  Furosemide Itching, Rash   Lisinopril Cough   Rosuvastatin  Other (See Comments)   Increases blood sugar   Trulicity [dulaglutide] Diarrhea, Nausea And Vomiting        Medication List     ASK your doctor about these medications    ALPRAZolam  0.5 MG tablet Commonly known as: XANAX  Take 0.5 mg by mouth daily.   amLODipine  5 MG  tablet Commonly known as: NORVASC  TAKE ONE (1) TABLET (5 MG TOTAL) BY MOUTH ONCE DAILY   aspirin  EC 81 MG tablet Take by mouth.   azelastine 0.05 % ophthalmic solution Commonly known as: OPTIVAR Apply to eye.   b complex vitamins tablet Take 1 tablet by mouth daily.   BD Pen Needle Nano 2nd Gen 32G X 4 MM Misc Generic drug: Insulin  Pen Needle Inject insulin  as directed if the insulin  pump were to fail.   Biotin 1 MG Caps Take 1 capsule by mouth daily.   bumetanide  1 MG tablet Commonly known as: BUMEX  Take 1 mg by mouth daily.   bumetanide  2 MG tablet Commonly known as: BUMEX  Take by mouth daily.   carvedilol  6.25 MG tablet Commonly known as: COREG  Take 6.25 mg by mouth 2 (two) times daily.   clopidogrel  75 MG tablet Commonly known as: PLAVIX  Take by mouth.   CO Q 10 PO Take 1 tablet by mouth daily.   ezetimibe  10 MG tablet Commonly known as: ZETIA  Take 10 mg by mouth daily.   FLUoxetine  10 MG capsule Commonly known as: PROZAC  Take by mouth.   folic acid  1 MG tablet Commonly known as: FOLVITE  Take 1 mg by mouth.   Gvoke HypoPen 2-Pack 1 MG/0.2ML Soaj Generic drug: Glucagon Inject into the skin.   hydrALAZINE 100 MG tablet Commonly known as: APRESOLINE Take 100 mg by mouth 2 (two) times daily.   insulin  aspart 100 UNIT/ML injection Commonly known as: novoLOG  INFUSE VIA INSULIN  PUMP ( OMNIPODS ) AS DIRECTED. MAX DAILY DOSE 105 UNI Ask about: Which instructions should I use?   Lantus  SoloStar 100 UNIT/ML Solostar Pen Generic drug: insulin  glargine In the event of insulin  pump failure, inject Lantus  41 units daily and restart the insulin  pump 24 hours after Lantus  is given.   losartan  100 MG tablet Commonly known as: COZAAR  Take 100 mg by mouth at bedtime.   Multi-Vitamin tablet Take 1 tablet by mouth every morning.   multivitamin with minerals tablet Take 1 tablet by mouth daily.   nitroGLYCERIN 0.4 MG SL tablet Commonly known as:  NITROSTAT Place 1 tablet (0.4 mg total) under the tongue every 5 (five) minutes as needed May take up to 3 doses.   Omnipod DASH Pods (Gen 4) Misc Inject into the skin.   oxyCODONE  5 MG immediate release tablet Commonly known as: Oxy IR/ROXICODONE  Take 5 mg by mouth.   pantoprazole  40 MG tablet Commonly known as: PROTONIX  Take by mouth.   polyethylene glycol 17 g packet Commonly known as: MIRALAX  / GLYCOLAX  Take 1 packet (17 g total) by mouth once daily as needed (May start when taking PO) Mix in 4-8ounces of fluid prior to taking.   pravastatin  80 MG tablet Commonly known as: PRAVACHOL  Take 80 mg by mouth at bedtime.   pregabalin  50 MG capsule Commonly known as: LYRICA  Take 50 mg by mouth.   rOPINIRole  2 MG tablet Commonly known as: REQUIP  Take 2 mg by mouth at bedtime. Ask about: Which instructions should I use?   Semaglutide(0.25 or 0.5MG /DOS) 2 MG/3ML Sopn Inject  0.25 mg into the skin.   senna-docusate 8.6-50 MG tablet Commonly known as: Senokot-S Take 1 tablet by mouth.   valsartan 80 MG tablet Commonly known as: DIOVAN Take 80 mg by mouth.   Vitamin D  50 MCG (2000 UT) tablet Take 2,000 Units by mouth daily.        Discharge Exam: Filed Weights   10/07/24 0946 10/08/24 0527 10/09/24 0500  Weight: 90.4 kg 90.4 kg 88.8 kg   General: She is not in acute distress. Cardiovascular:     Rate and Rhythm: Normal rate and regular rhythm.  Pulmonary: Bilateral basal crackles noted Musculoskeletal: Bilateral lower extremity pitting edema Skin:    General: Skin is warm and dry.     Findings: Rash (scattred papular rash worse on legs but present on arms and few spots on abdomen) present.  Neurological:     Mental Status: She is alert and oriented to person, place, and time.  Psychiatric:        Mood and Affect: Mood normal.        Behavior: Behavior normal.   Condition at discharge: fair  The results of significant diagnostics from this hospitalization  (including imaging, microbiology, ancillary and laboratory) are listed below for reference.   Imaging Studies: ECHOCARDIOGRAM COMPLETE Result Date: 10/08/2024    ECHOCARDIOGRAM REPORT   Patient Name:   MELAINA HOWERTON [facility] Date of Exam: 10/08/2024 Medical Rec #:  [id number]    Height:       60.0 in Accession #:    [id number]   Weight:       199.3 lb Date of Birth:  25-May-1946   BSA:          1.864 m Patient Age:    77 years     BP:           104/44 mmHg Patient Gender: F            HR:           62 bpm. Exam Location:  [facility] Procedure: 2D Echo, Cardiac Doppler, Color Doppler and Intracardiac            Opacification Agent (Both Spectral and Color Flow Doppler were            utilized during procedure). Indications:     CHF I50.9  History:         Patient has prior history of Echocardiogram examinations, most                  recent 11/22/2022.                  Aortic Valve: valve is present in the aortic position.                  Procedure Date: 6/25.  Sonographer:     Thedora Louder RDCS, FASE Referring Phys:  [id number] LANETA BLUNT Diagnosing Phys: Denyse Bathe  Sonographer Comments: Technically challenging study due to limited acoustic windows, patient is obese, no apical window and no subcostal window. Image acquisition challenging due to patient body habitus and Image acquisition challenging due to respiratory motion. IMPRESSIONS  1. Left ventricular ejection fraction, by estimation, is 55 to 60%. The left ventricle has normal function. The left ventricle has no regional wall motion abnormalities. There is severe concentric left ventricular hypertrophy. Left ventricular diastolic  parameters are consistent with Grade I diastolic dysfunction (impaired relaxation).  2. Right ventricular systolic function is normal. The right ventricular size is normal.  3. The mitral valve is normal in structure. No evidence of mitral valve regurgitation. No evidence of mitral stenosis.  4. The aortic valve has been  repaired/replaced. Aortic valve regurgitation is not visualized. No aortic stenosis is present. There is a valve present in the aortic position. Procedure Date: 6/25.  5. The inferior vena cava is normal in size with greater than 50% respiratory variability, suggesting right atrial pressure of 3 mmHg. FINDINGS  Left Ventricle: Left ventricular ejection fraction, by estimation, is 55 to 60%. The left ventricle has normal function. The left ventricle has no regional wall motion abnormalities. Definity  contrast agent was given IV to delineate the left ventricular  endocardial borders. Strain was performed and the global longitudinal strain is indeterminate. The left ventricular internal cavity size was normal in size. There is severe concentric left ventricular hypertrophy. Left ventricular diastolic parameters are consistent with Grade I diastolic dysfunction (impaired relaxation). Right Ventricle: The right ventricular size is normal. No increase in right ventricular wall thickness. Right ventricular systolic function is normal. Left Atrium: Left atrial size was normal in size. Right Atrium: Right atrial size was normal in size. Pericardium: There is no evidence of pericardial effusion. Mitral Valve: The mitral valve is normal in structure. No evidence of mitral valve regurgitation. No evidence of mitral valve stenosis. Tricuspid Valve: The tricuspid valve is normal in structure. Tricuspid valve regurgitation is not demonstrated. No evidence of tricuspid stenosis. Aortic Valve: Had [facility] at [facility], and no siginificant gradiant and no AR. The aortic valve has been repaired/replaced. Aortic valve regurgitation is not visualized. No aortic stenosis is present. Aortic valve mean gradient measures 9.0 mmHg. Aortic valve peak gradient measures 18.6 mmHg. There is a valve present in the aortic position. Procedure Date: 6/25. Pulmonic Valve: The pulmonic valve was normal in structure. Pulmonic valve regurgitation is not  visualized. No evidence of pulmonic stenosis. Aorta: The aortic root is normal in size and structure. Venous: The inferior vena cava is normal in size with greater than 50% respiratory variability, suggesting right atrial pressure of 3 mmHg. IAS/Shunts: No atrial level shunt detected by color flow Doppler. Additional Comments: 3D was performed not requiring image post processing on an independent workstation and was indeterminate.  LEFT VENTRICLE PLAX 2D LVIDd:         3.60 cm LVIDs:         2.40 cm LV PW:         1.40 cm LV IVS:        1.10 cm LVOT diam:     1.80 cm LVOT Area:     2.54 cm  LEFT ATRIUM         Index LA diam:    5.10 cm 2.74 cm/m  AORTIC VALVE               PULMONIC VALVE AV Vmax:      215.50 cm/s  RVOT Peak grad: 3 mmHg AV Vmean:     134.500 cm/s AV VTI:       0.468 m AV Peak Grad: 18.6 mmHg AV Mean Grad: 9.0 mmHg  AORTA Ao Root diam: 2.50 cm TRICUSPID VALVE TR Peak grad:   30.9 mmHg TR Vmax:        278.00 cm/s  SHUNTS Systemic Diam: 1.80 cm Denyse Bathe Electronically signed by Denyse Bathe Signature Date/Time: 10/08/2024/2:38:54 PM    Final    DG Chest 2 View Result Date: 10/07/2024 EXAM: 2 VIEW(S) XRAY OF THE CHEST 10/07/2024 10:08:00 AM COMPARISON: 04/04/2024  CLINICAL HISTORY: sob FINDINGS: LUNGS AND PLEURA: No focal pulmonary opacity. No pleural effusion. No pneumothorax. HEART AND MEDIASTINUM: Cardiomegaly, stable. Post TAVR. Left subclavian dual lead cardiac rhythm device noted. BONES AND SOFT TISSUES: Chronic T12 fracture. Exaggerated thoracic kyphosis. IMPRESSION: 1. No acute cardiopulmonary process identified. 2. Cardiomegaly, stable. Post TAVR with left subclavian dual-lead cardiac rhythm device in place. 3. 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 Electronically signed by: Ryan Chess MD 10/07/2024 11:25 AM EST RP Workstation: [id number]    Microbiology: Results for orders placed or performed during the hospital encounter of 04/04/24  Fungus Culture With  Stain     Status: None   Collection Time: 04/04/24  9:30 AM   Specimen: PATH Cytology Pleural fluid  Result Value Ref Range Status   Fungus Stain Comment  Final    Comment: KOH/Calcofluor preparation:  no fungus observed.   Fungus (Mycology) Culture Final report  Final    Comment: (NOTE) Performed At: [facility] [address] [place], [place] [id number] Jennette Shorter MD Ey:[id number]    Fungal Source PLEURAL  Final    Comment: Performed at [facility], [address] [place], [place] [address]  Acid Fast Culture with reflexed sensitivities     Status: None   Collection Time: 04/04/24  9:30 AM   Specimen: PATH Cytology Pleural fluid  Result Value Ref Range Status   Acid Fast Culture Negative  Final    Comment: (NOTE) No acid fast bacilli isolated after 6 weeks. Performed At: [facility] [address] [place], [place] [id number] Jennette Shorter MD Ey:[id number]    Source of Sample PLEURAL  Final    Comment: Performed at [facility], [address] [place], [place] [address]  Acid Fast Smear (AFB)     Status: None   Collection Time: 04/04/24  9:30 AM   Specimen: PATH Cytology Pleural fluid  Result Value Ref Range Status   AFB Specimen Processing Direct Inoculation  Final   Acid Fast Smear Negative  Final    Comment: (NOTE) Performed At: [facility] [address] [place], [place] [id number] Jennette Shorter MD Ey:[id number]    Source (AFB) PLEURAL  Final    Comment: Performed at [facility], [address] [place], [place] [address]  Body fluid culture w Gram Stain     Status: None   Collection Time: 04/04/24  9:30 AM   Specimen: PATH Cytology Pleural fluid  Result Value Ref Range Status   Specimen Description   Final    PLEURAL Performed at [facility], [address], [place], [place] [address]    Special Requests   Final    NONE Performed at [facility], [address]  [place], [place] [address]    Gram Stain NO WBC SEEN NO ORGANISMS SEEN   Final   Culture   Final    NO GROWTH 3 DAYS Performed at [facility], [address], [place], [place] [address]    Report Status 04/08/2024 FINAL  Final  Fungal organism reflex     Status: None   Collection Time: 04/04/24  9:30 AM  Result Value Ref Range Status   Fungal result 1 Comment  Final    Comment: (NOTE) No yeast or mold isolated after 4 weeks. Performed At: [facility] [address] [place], [place] [id number] Jennette Shorter MD Ey:[id number]     Labs: CBC: Recent Labs  Lab 10/07/24 0948 10/07/24 1251 10/09/24 0819  WBC 7.8  6.1 6.2  NEUTROABS  --   --  4.6  HGB 9.6* 9.0* 8.4*  HCT 30.0* 27.4* 26.5*  MCV 97.7 97.5 97.8  PLT 143* 140* 125*   Basic Metabolic Panel: Recent Labs  Lab 10/07/24 0958 10/07/24 1251 10/08/24 0547 10/09/24 0612  NA 141  --  140 137  K 4.1  --  4.3 4.4  CL 101  --  102 100  CO2 25  --  25 25  GLUCOSE 175*  --  76 126*  BUN 96*  --  110* 118*  CREATININE 2.48* 2.37* 2.87* 3.07*  CALCIUM  8.3*  --  7.9* 7.6*   Liver Function Tests: Recent Labs  Lab 10/07/24 0958  AST 29  ALT 31  ALKPHOS 143*  BILITOT 0.3  PROT 6.7  ALBUMIN 4.1   CBG: Recent Labs  Lab 10/08/24 1209 10/08/24 1539 10/08/24 2046 10/09/24 0205 10/09/24 0757  GLUCAP 100* 223* 218* 157* 98    Discharge time spent:  37 minutes.  Signed: Drue ONEIDA Potter, MD [facility] 10/09/2024

## 2024-10-09 NOTE — ED Provider Notes (Signed)
 [facility] EMERGENCY DEPARTMENT  ED Provider Note History   Chief Complaint  Patient presents with  . Shortness of Breath   History of Present Illness  Lindsay Glenn is a 78 y.o. 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  Patient reports that over the past week she has had gradually worsening shortness of breath with minimal exertion and weight gain approximately 15 pounds over the past week.  She reports fluid retention in her abdomen and bilateral lower extremities.  She denies any fevers, chills, chest pain.  She does report decreased urination despite increasing oral Bumex  dosing from 2 mg twice daily to 3 g twice daily.  Patient's daughter also notes that a week ago she had an episode of altered mental status in which she appeared to have unequal pupils, confusion and lethargy.  The symptoms resolved spontaneously.  Patient denies any numbness, tingling or weakness in her extremities.  She denies any facial asymmetry or slurred speech.  Of note, patient was admitted on 10/07/2024 at [facility] for fluid overload. echo was obtained and an estimated EF of 55 to 60%.  She was ultimately discharged home today but daughter went to take her home and she could not walk up the stairs to get into her house so she brought her directly to the ED here.  Level of Interpreter Services: No interpreter needed (no language barrier)  Past Medical History:  Diagnosis Date  . Allergy   . Anemia 2024   ongoing for several years  . Anesthesia complication    aspirated after colonoscopy, 10-12 years ago   . Anxiety 2019   due to husband's Alzheimer's  . Atherosclerosis of native coronary artery of native heart without angina pectoris 01/05/2024  . Autoimmune  disease (HHS-HCC)   . Breast cancer (CMS/HHS-HCC) 2000  . Bunion of left foot 05/03/2020  . Bunion of right foot 05/03/2020  . Cancer (CMS/HHS-HCC) 2000  . Cardiomyopathy, secondary (CMS/HHS-HCC) 2019  . Carpal tunnel syndrome, right 03/19/2020  . CHF (congestive heart failure) (CMS/HHS-HCC) 11/2019  . Chronic heart failure with preserved ejection fraction (HFpEF) (CMS/HHS-HCC) 01/20/2020  . Chronic kidney disease    CKD Stage III  . Depression 2024   due to husband's Alzheimer's  . Dermatitis 2024   eczema  . Diabetes mellitus type I (CMS/HHS-HCC) LADA 2021  . Dyspnea on exertion 01/20/2020  . Edema   . Essential hypertension 01/20/2020  . Gallbladder polyp 12/18/2018  . Gastroparesis 12/18/2018  . GERD (gastroesophageal reflux disease) 2000   resolved  . H/O bilateral mastectomy 03/20/2020  . Hammer toe of left foot 05/03/2020  . Hammer toe of right foot 05/03/2020  . Hashimoto's thyroiditis 11/17/2008  . Heart disease 2020   HFPEF  . Heart murmur 2023?  SABRA History of blood transfusion   . History of breast cancer 03/20/2020  . History of carpal tunnel release 03/16/2020  . History of cataract   . History of CVA (cerebrovascular accident) 04/17/2020  . History of pneumonia April 2025  . History of shoulder surgery 08/2021  . History of stroke 05/2016 & 11/21/2022  . Hyperlipidemia 2010   under control with zetia   . Hyperparathyroidism due to renal insufficiency (HHS-HCC)   . Hypotension   . Insulin  pump in place    BILATERAL  .  Iron deficiency 09/18/2020  . 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 Liver disease   . Major depressive disorder, recurrent, mild () 07/17/2021  . OSA (obstructive sleep apnea) 05/31/2020  . Osteopenia of neck of left femur 04/03/2020  . Osteoporosis    now osteopenia  . Pancreatic cyst (HHS-HCC) 01/16/2019  . 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 Primary  osteoarthritis of right shoulder 03/16/2020  . Pseudophakia of left eye 08/01/2020   S/P Phaco/Traditional/IOL Left Eye with Alcon CNA0T0 23.0D,No LRI, wound@zero , Resure, 08/01/2020, VLynk, [facility], Sweep, DEX-Submerged   . Pseudophakia of right eye 08/08/2020   S/P Phaco/Traditional/IOL Right Eye with Alcon CNA0T0 23.0D,No LRI, wound@180 , Resure, 08/08/2020, VLynk, [facility], Sweep, DEX-Submerged   . Renal cyst, right 04/17/2020  . Renal insufficiency CKD 3  . Restless leg syndrome 01/16/2000  . Rotator cuff tendinitis, left 05/16/2020  . Rotator cuff tendinitis, right 05/16/2020  . Severe obesity (BMI 36.0-40 with comorbidity) (CMS-HCC) 03/20/2020  . Sleep apnea 1999   does not tolerate CPAP  . 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/2020  . Thyroid  disease 2011  . Type 2 diabetes mellitus (CMS/HHS-HCC) July 2017   upgraded to T1 2021  . Vision abnormalities    Past Surgical History:  Procedure Laterality Date  . HYSTERECTOMY VAGINAL W/COLPECTOMY  1995  . OOPHORECTOMY  1995  . CARPAL TUNNEL RELEASE  2004  . REPLACEMENT TOTAL KNEE Left 2006  . SHOULDER ARTHROSCOPY DISTAL CLAVICLE EXCISION AND OPEN ROTATOR CUFF REPAIR Right 2017  . REPLACEMENT TOTAL KNEE Right 2019  . Open right carpal tunnel release Right 04/04/2020   Dr.Poggi   . EXTRACTION CATARACT EXTRACAPSULAR W/INSERTION INTRAOCULAR PROSTHESIS Left 08/01/2020   Procedure: LEFT ORA, LRI-cataract extraction with intraocular lens implant;  Surgeon: Juvenal Grayce Snell, MD;  Location: [facility];  Service: Ophthalmology;  Laterality: Left;  . EXTRACTION CATARACT EXTRACAPSULAR W/INSERTION INTRAOCULAR PROSTHESIS Right 08/08/2020   Procedure: RIGHT DEXTENZA , ORA, LRI-Cataract extraction with intraocular lens implant and limbal relaxing incisions;  Surgeon: Juvenal Grayce Snell, MD;  Location: [facility];  Service: Ophthalmology;  Laterality: Right;  LRI  . Right total shoulder arthroplasty Right 09/05/2021    Dr.Poggi  . UPPER GASTROINTESTINAL ENDOSCOPY N/A 05/21/2022   Procedure: Upper Endoscopic Ultrasound;  Surgeon: Jowell, Paul Simon, MD;  Location: [facility];  Service: Gastroenterology;  Laterality: N/A;  . OTHER SURGERY  01/2024   Angioplasty  . OTHER SURGERY  04/2024   Aortic Valve Replacement  . TRANSCATHETER AORTIC VALVE REPLACEMENT Right 04/21/2024   Procedure: TRANSCATHETER AORTIC VALVE REPLACEMENT (SAPIEN) WITH PROSTHETIC VALVE; PERCUTANEOUS FEMORAL ARTERY APPROACH;  Surgeon: Trudy Juliene Ade, MD;  Location: [facility];  Service: Cardiothoracic;  Laterality: Right;  . TRANSCATHETER AORTIC VALVE REPLACEMENT Right 04/21/2024   Procedure: TRANSCATHETER AORTIC VALVE REPLACEMENT (SAPIEN) WITH PROSTHETIC VALVE; PERCUTANEOUS FEMORAL ARTERY APPROACH;  Surgeon: Cherylin Arthea Mulders, MD;  Location: [facility];  Service: General Surgery;  Laterality: Right;  . OTHER SURGERY  04/28/2024   Pacemaker Implant  . BREAST SURGERY    . CARDIAC CATHETERIZATION  12/2023   2 blocked arteries  . CARDIAC SURGERY  3/35;6/25   TAVR; pacemaker  . CARDIAC VALVE REPLACEMENT  2025  . CATARACT EXTRACTION  2022  . COLON SURGERY    . COLONOSCOPY  2011  . COMBINED AUGMENTATION MAMMAPLASTY AND ABDOMINOPLASTY    . CORONARY ANGIOPLASTY  03/05/202511  . CORONARY ANGIOPLASTY WITH STENT PLACEMENT  2025  .  FRACTURE SURGERY  2004   Rt. ankle dislocation  . HYSTERECTOMY  1998  . INSERT / REPLACE / REMOVE PACEMAKER  2025  . JOINT REPLACEMENT  2008, 2019   lt knee, rt knee  . MASTECTOMY  2000   left. rt prophylactic  . PATIENT ENTERED HX  bilateral arm lift  . PR REPAIR DIAPHR HERNIA TRAUMA ACUTE    . SIGMOIDOSCOPY  2011  . TUBAL LIGATION  1974   Family History  Problem Relation Age of Onset  . Skin cancer, non-melanoma Mother   . Alzheimer's disease Mother   . Deep vein thrombosis (DVT or abnormal blood clot formation) Mother   . High blood pressure (Hypertension) Father    . Other Father        liposarcoma, originated in pancreas and required complete pancreatectomy  . Cancer Father   . Diabetes Maternal Aunt   . Parkinsonism Paternal Aunt   . Macular degeneration Neg Hx   . Glaucoma Neg Hx   . Anesthesia problems Neg Hx    Social History   Socioeconomic History  . Marital status: Widowed    Spouse name: Norleen  . Number of children: 1  . Years of education: some college  . Highest education level: Associate degree: occupational, scientist, product/process development, or vocational program  Occupational History  . Occupation: Retired, Dentist, clerical work  Tobacco Use  . Smoking status: Never  . Smokeless tobacco: Never  Vaping Use  . Vaping status: Never Used  Substance and Sexual Activity  . Alcohol use: Yes    Alcohol/week: 0.0 - 1.0 standard drinks of alcohol    Comment: rare  . Drug use: Never  . Sexual activity: Not Currently    Birth control/protection: None  Other Topics Concern  . Would you please tell us  about the people who live in your home, your pets, or anything else important to your social life? Yes  Social History Narrative   10/22/2022   Military Service: None   Likes/Enjoys/What fills your day?:  Quilting, gardening (limited), finances, caregiver for husband who has mild cognitive impairment (Alzheimer's)   Home: Two Story   Your Bedrooms is on: Second Level (main level)   Fewest Steps to enter the home: 4   Other persons in the home: husband, daughter and family   Pets: cat   Medical equipment you use daily: None   Medical equipment available in the home: BP monitor   Dental: no significant dental problems. Screening is: Up to date Provider/City: Dr. Norman, [place]   Vision: Prescription Glasses. Screening is: Up to date (annually) Provider/City: [place]   Hearing: Denies any issues in Hearing .  Screening is: Up to date (back when she was in [facility])   Dermatology: Denies any areas of concern on skin. Screening is: Up to date Provider/City:  [facility]      Social Drivers of Health   Financial Resource Strain: Low Risk  (07/29/2024)   Overall Financial Resource Strain (CARDIA)   . Difficulty of Paying Living Expenses: Not hard at all  Food Insecurity: No Food Insecurity (10/08/2024)   Received from [facility]   Hunger Vital Sign   . Within the past 12 months, you worried that your food would run out before you got the money to buy more.: Never true   . Within the past 12 months, the food you bought just didn't last and you didn't have money to get more.: Never true  Transportation Needs: No Transportation Needs (10/08/2024)  Received from [facility] - Transportation   . In the past 12 months, has lack of transportation kept you from medical appointments or from getting medications?: No   . In the past 12 months, has lack of transportation kept you from meetings, work, or from getting things needed for daily living?: No  Physical Activity: Inactive (07/29/2024)   Exercise Vital Sign   . Days of Exercise per Week: 0 days   . Minutes of Exercise per Session: 0 min  Stress: Stress Concern Present (07/29/2024)   [employer] of Occupational [employer] - Occupational Stress Questionnaire   . Feeling of Stress : To some extent  Social Connections: Moderately Integrated (07/29/2024)   Social Connection and Isolation Panel   . Frequency of Communication with Friends and Family: More than three times a week   . Frequency of Social Gatherings with Friends and Family: Twice a week   . Attends Religious Services: More than 4 times per year   . Active Member of Clubs or Organizations: Yes   . Attends Banker Meetings: More than 4 times per year   . Marital Status: Widowed  Housing Stability: Low Risk  (08/02/2024)   Housing Stability Vital Sign   . Unable to Pay for Housing in the Last Year: No   . Number of Times Moved in the Last Year: 0   . Homeless in the Last Year: No   Review of Systems: All other systems  negative except as noted in HPI.   Physical Exam  BP 107/45 (BP Location: Right forearm, Patient Position: Lying)   Pulse 60   Temp 36.4 C (97.6 F) (Oral)   Resp 14   LMP  (LMP Unknown) Comment: no longer has menses  SpO2 97%  Physical Exam Vitals and nursing note reviewed.  Constitutional:      General: She is not in acute distress.    Appearance: She is well-developed.  HENT:     Head: Normocephalic and atraumatic.  Eyes:     Conjunctiva/sclera: Conjunctivae normal.  Cardiovascular:     Rate and Rhythm: Normal rate and regular rhythm.     Heart sounds: No murmur heard. Pulmonary:     Effort: Pulmonary effort is normal. No respiratory distress.     Breath sounds: Normal breath sounds.  Abdominal:     Palpations: Abdomen is soft.     Tenderness: There is no abdominal tenderness.     Comments: Moderately distended.  Musculoskeletal:        General: No swelling.     Cervical back: Neck supple.     Right lower leg: Edema present.     Left lower leg: Edema present.  Skin:    General: Skin is warm and dry.     Capillary Refill: Capillary refill takes less than 2 seconds.  Neurological:     Mental Status: She is alert.     Comments: Cranial nerves II through XII are intact.  5 out of 5 motor strength throughout all 4 extremities.  Sensation is intact throughout.  No dysmetria on finger-to-nose testing.  Psychiatric:        Mood and Affect: Mood normal.      Procedures  Procedures TIP (no need to delete)  Delete the Procedures section if patient does not have a procedure.  Medical Decision Making and ED Course  Given the patient's initial exam and evaluation, the following diagnostic evaluation has been ordered. The patient will be placed in the  appropriate treatment space, once one is available to complete the evaluation and treatment. I have discussed the plan of care with the patient/representative and have advised that the plan of care may change based upon the  results of diagnostic testing. The patient/representative has been asked to not leave prior to the completion of their evaluation. I have advised that if they leave prior to the completion of their evaluation, they will be leaving against medical advice.  At presentation she is afebrile, vital signs are stable.  She is stable on room air at 97%.  Patient is well-appearing and in no acute distress.  Her breathing is nonlabored.  Her lungs are clear to auscultation bilaterally.  Physical exam is notable for symmetric pedal edema in the bilateral lower extremities and abdominal distention.  Abdomen is otherwise soft and nontender to palpation.  She is neurologically intact with a NIH of scale of 0.  Differential diagnosis: - Acute CHF exacerbation - Volume overload due to progression to end-stage renal disease -venous insufficiency, lymphedema -TIA, stroke recrudescence  ED Course:  I did not initiate a code stroke given patient symptoms occurred a week ago and resolved spontaneously.  Patient has NIH scale of 0.  CBC is remarkable for a macrocytic anemia that is slightly downtrending from recent hospitalization and mild thrombocytopenia.  Chem-12 remarkable for elevated BUN and creatinine uptrending from baseline concerning for AKI.  High sensitivity troponin is 22 with a delta of 1 at 1 hour.  BNP is mildly elevated but downtrending from 10/05/24.  EKG demonstrates atrial ventricular dual paced rhythm with prolonged AV conduction, rate of 64 and no ST segment changes meeting STEMI criteria.  Chest x-ray demonstrates no evidence of acute intrathoracic disease.  There are chronic appearing interstitial changes with low lung volumes.  CT head shows no evidence of acute intracranial pathology.  There is an old right occipital lobe infarct with encephalomalacia.  Consult was placed to nephrology due to concerns for worsening AKI in the setting of CKD stage III. I spoke iwht Dr. Wyatt who will  evaluate the patient and make recommendations regarding diuresis.  He also discussed the potential for renal biopsy to assess for vasculitis.  I believe she will need admission to medicine for further management.  Medical Complexity:  [x] New and requires workup. [] New and does not require workup. [x] Pertinent labs & imaging results were reviewed by me and considered in my decision making. [] I obtained history from someone other than the patient. [x] I reviewed previous medical records. [x] I independently visualized image(s), tracing(s), and/or specimen(s). [x] I discussed the patient with another provider.     Results:   LABS: Recent Results (from the past 24 hours)  Complete Blood Count (CBC) with Differential   Collection Time: 10/09/24 10:07 AM  Result Value Ref Range   WBC (White Blood Cell Count) 6.5 3.2 - 9.8 x10^9/L   Hemoglobin 9.6 (L) 11.7 - 15.5 g/dL   Hematocrit 69.7 (L) 64.9 - 45.0 %   Platelets 135 (L) 150 - 450 x10^9/L   MCV (Mean Corpuscular Volume) 99 (H) 80 - 98 fL   MCH (Mean Corpuscular Hemoglobin) 31.5 26.5 - 34.0 pg   MCHC (Mean Corpuscular Hemoglobin Concentration) 31.8 31.0 - 36.0 %   RBC (Red Blood Cell Count) 3.05 (L) 3.77 - 5.16 x10^12/L   RDW-CV (Red Cell Distribution Width) 14.2 11.5 - 14.5 %   NRBC (Nucleated Red Blood Cell Count) 0.00 0 x10^9/L   NRBC % (Nucleated Red Blood Cell %) 0.0 %  MPV (Mean Platelet Volume) 12.2 (H) 7.2 - 11.7 fL   Neutrophil Count 5.1 2.0 - 8.6 x10^9/L   Neutrophil % 77.4 37 - 80 %   Lymphocyte Count 0.6 0.6 - 4.2 x10^9/L   Lymphocyte % 8.7 (L) 10 - 50 %   Monocyte Count 0.5 0 - 0.9 x10^9/L   Monocyte % 7.5 0 - 12 %   Eosinophil Count 0.35 0 - 0.70 x10^9/L   Eosinophil % 5.4 0 - 7 %   Basophil Count 0.03 0 - 0.20 x10^9/L   Basophil % 0.5 0 - 2 %   Immature Granulocyte Count 0.03 <=0.06 x10^9/L   Immature Granulocyte % 0.5 <=0.7 %  Comprehensive Metabolic Panel (CMP)   Collection Time: 10/09/24 10:07 AM  Result  Value Ref Range   Sodium 138 135 - 145 mmol/L   Potassium 4.1 3.5 - 5.0 mmol/L   Chloride 95 (L) 98 - 108 mmol/L   Carbon Dioxide (CO2) 25 21 - 30 mmol/L   Urea  Nitrogen (BUN) 122 (H) 7 - 20 mg/dL   Creatinine 3.1 (H) 0.4 - 1.0 mg/dL   Glucose 61 (L) 70 - 859 mg/dL   Calcium  8.1 (L) 8.7 - 10.2 mg/dL   AST (Aspartate Aminotransferase) 21 15 - 41 U/L   ALT (Alanine Aminotransferase) 23 10 - 39 U/L   Bilirubin, Total 0.7 0.4 - 1.5 mg/dL   Alk Phos (Alkaline Phosphatase) 104 24 - 110 U/L   Albumin 3.3 (L) 3.5 - 4.8 g/dL   Protein, Total 6.2 6.2 - 8.1 g/dL   Anion Gap 18 (H) 3 - 12 mmol/L   BUN/CREA Ratio 40 (H) 6 - 27   Glomerular Filtration Rate (eGFR)  15 mL/min/1.73sq m  Activated Partial Thromboplastin Time (APTT)   Collection Time: 10/09/24 10:07 AM  Result Value Ref Range   Act Partial Thromboplastin Time 31.5 26.8 - 37.1 sec  Prothrombin Time (INR)   Collection Time: 10/09/24 10:07 AM  Result Value Ref Range   Prothrombin Time 11.2 9.5 - 13.1 sec   Prothrombin INR 1.0 0.9 - 1.1  Troponin I, High Sensitivity ED Evaluation (hsTnI)   Collection Time: 10/09/24 10:07 AM  Result Value Ref Range   hsTnI Baseline 22 ng/L   hsTnI Interpretation Indeterminate Rule-Out, Indeterminate   Symptom Duration >=3 hours   Brain Natriuretic Peptide   Collection Time: 10/09/24 10:07 AM  Result Value Ref Range   BNP (Brain Natriuretic Peptide) 213.0 (H) <=100.0 pg/mL  POC GLUCOSE   Collection Time: 10/09/24 11:19 AM  Result Value Ref Range   POC Glucose 53 (LL) 70 - 140 mg/dL   RALS Comment RN MD NOTIFIED   Troponin I, High Sensitivity One Hour (hsTnI)   Collection Time: 10/09/24 11:40 AM  Result Value Ref Range   hsTnI 1 hr 21 ng/L   Delta from baseline 1    hsTnI Interpretation Indeterminate Rule-Out, Indeterminate  POC GLUCOSE   Collection Time: 10/09/24 11:43 AM  Result Value Ref Range   POC Glucose 68 (LL) 70 - 140 mg/dL   RALS Comment RN MD NOTIFIED     IMAGING: Notified  that Radiology will read radiologic studies and patient will be called if change in report by radiology department. X-ray chest single view portable  Final Result    CT brain without contrast  Final Result           ED Clinical Impression  1. Dyspnea on exertion   2. TIA (transient ischemic attack)  This note was partially written using Dragon (a voice recognition system) and may contain typographical errors missed during proofreading. TIP (no need to delete)  Click Refresh button to add wrap-up information to note.     Sofie Houston [place], [place] 10/09/24 [phone]

## 2024-10-09 NOTE — Progress Notes (Signed)
 Progress Note   Patient: Lindsay Glenn FMW:[id number] DOB: December 25, 1945 DOA: 10/07/2024     2 DOS: the patient was seen and examined on 10/09/2024   Brief hospital course: From HPI Lindsay Glenn is a 78 y.o. 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 On Plavix  + ASA, intolerant to high potency statin so is on pravastatin  Renal: CKD3b baseline Cr  Resp: restrictive lung disease, chronic hypoxic respiratory failureon 2L O2 [place] at home Misc: T1DM on pump insulin , OSA w/ hx intolerant to CPAP, Hx breast cancer, GERD, MDD, hypothyroid.  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 question vasculitis process vs RA, started methotrexate which was initially held over the summer - took a dose and developed shingles but this was around time of deat hof her husband do shingles attributed to stress more so than as medication effect     Assessment and Plan:   Acute on chronic diastolic congestive heart failure Hx TAVR 04/2024 w/ heart block post-procedure and pacemaker placed  HTN HLD Continue diuresis w/ Bumex  (noted allergy to Lasix w/ itching but tolerates Bumex  fine)  Continue strict I&O, fluid restriction, daily weights  GDMT as BP and renal fxn allows, continue home coreg  Continue ASA, statin, zetia , plavix   Echo obtained yesterday showed EF 55 to 60% with grade 1 diastolic dysfunction Cardiology consult - pt follows w/ [facility] outpatient Plan of care discussed with cardiologist Dr.Khan   AKI on CKD3a Likely prerenal d/t cardiorenal syndrome  Calculate FeUr to confirm - urine studies pending  Continue to monitor BMP Low threshold to consult nephrology if not improving    Rash c/w contact dermatitis At this time do not  suspect drug rash but she has started methotrexate recently Symptom management benadryl  Skin check daily   Hx CVA Continue ASA, Plavix , statin    IDDM, Type 1 on insulin  pump  Continue insulin  pump Diabetes nurse consult    Nonspecific autoimmune / inflammatory arthritis see rheumatology notes Hold methotrexate for now Continue pain control      DVT prophylaxis: heparin   CODE STATUS: Full code  Subjective:  Patient seen and examined at bedside Admits to improvement in respiratory function Still having some lower extremity swelling Patient has been seen by cardiologist  Physical Exam:    General: She is not in acute distress. Cardiovascular:     Rate and Rhythm: Normal rate and regular rhythm.  Pulmonary: Bilateral basal crackles noted Musculoskeletal: Bilateral lower extremity pitting edema Skin:    General: Skin is warm and dry.     Findings: Rash (scattred papular rash worse on legs but present on arms and few spots on abdomen) present.  Neurological:     Mental Status: She is alert and oriented to person, place, and time.  Psychiatric:        Mood and Affect: Mood normal.        Behavior: Behavior normal.   Vitals:   10/09/24 0156 10/09/24 0446 10/09/24 0500 10/09/24 0713  BP: (!) 105/45 (!) 99/39  (!) 113/46  Pulse: 62 (!) 59  60  Resp: 20 20    Temp:  98.3 F (36.8 C)  98.1 F (36.7 C)  TempSrc:      SpO2: 100% 99%  99%  Weight:   88.8 kg   Height:        Data Reviewed:  I have reviewed patient's lab reports showing creatinine 2.87 which is worsening from her baseline of 1.61-year ago Sodium 140 potassium 4.3  I have also reviewed patient's chest x-ray showing cardiomegaly but no intrapulmonary acute process   Family Communication: Discussed with patient's daughter  Disposition: Pending clinical stabilization before discharge   Time spent: 52 minutes  Author: Drue ONEIDA Potter, MD 10/09/2024 2:58 PM  For on call review [email or web address].

## 2024-10-10 MED FILL — Perflutren Protein-Type A Microspheres IV Susp: INTRAVENOUS | Qty: 5 | Status: AC

## 2024-12-09 ENCOUNTER — Inpatient Hospital Stay: Attending: Infectious Diseases

## 2024-12-19 ENCOUNTER — Other Ambulatory Visit: Payer: Self-pay | Admitting: Infectious Diseases

## 2024-12-19 DIAGNOSIS — H532 Diplopia: Secondary | ICD-10-CM

## 2024-12-19 DIAGNOSIS — R410 Disorientation, unspecified: Secondary | ICD-10-CM

## 2024-12-19 DIAGNOSIS — R531 Weakness: Secondary | ICD-10-CM

## 2024-12-19 DIAGNOSIS — R42 Dizziness and giddiness: Secondary | ICD-10-CM

## 2024-12-20 ENCOUNTER — Other Ambulatory Visit

## 2025-04-07 ENCOUNTER — Other Ambulatory Visit

## 2025-04-07 ENCOUNTER — Ambulatory Visit: Admitting: Oncology
